# Patient Record
Sex: Female | Born: 1935 | Race: White | Hispanic: No | Smoking: Never smoker
Health system: Southern US, Community
[De-identification: ages and names within clinical notes are randomized; demographics above are authoritative.]

## PROBLEM LIST (undated history)

## (undated) DIAGNOSIS — M1990 Unspecified osteoarthritis, unspecified site: Secondary | ICD-10-CM

## (undated) DIAGNOSIS — C4490 Unspecified malignant neoplasm of skin, unspecified: Secondary | ICD-10-CM

## (undated) DIAGNOSIS — I10 Essential (primary) hypertension: Secondary | ICD-10-CM

## (undated) DIAGNOSIS — L570 Actinic keratosis: Secondary | ICD-10-CM

## (undated) DIAGNOSIS — A419 Sepsis, unspecified organism: Secondary | ICD-10-CM

## (undated) DIAGNOSIS — K5909 Other constipation: Secondary | ICD-10-CM

## (undated) DIAGNOSIS — K863 Pseudocyst of pancreas: Secondary | ICD-10-CM

## (undated) DIAGNOSIS — D649 Anemia, unspecified: Secondary | ICD-10-CM

## (undated) DIAGNOSIS — R001 Bradycardia, unspecified: Secondary | ICD-10-CM

## (undated) DIAGNOSIS — K862 Cyst of pancreas: Secondary | ICD-10-CM

## (undated) DIAGNOSIS — R600 Localized edema: Secondary | ICD-10-CM

## (undated) DIAGNOSIS — I482 Chronic atrial fibrillation, unspecified: Secondary | ICD-10-CM

## (undated) DIAGNOSIS — K219 Gastro-esophageal reflux disease without esophagitis: Secondary | ICD-10-CM

## (undated) DIAGNOSIS — M419 Scoliosis, unspecified: Secondary | ICD-10-CM

## (undated) DIAGNOSIS — Z9181 History of falling: Secondary | ICD-10-CM

## (undated) HISTORY — DX: Scoliosis, unspecified: M41.9

## (undated) HISTORY — DX: Cyst of pancreas: K86.2

## (undated) HISTORY — DX: Localized edema: R60.0

## (undated) HISTORY — DX: Pseudocyst of pancreas: K86.3

## (undated) HISTORY — DX: Other constipation: K59.09

## (undated) HISTORY — PX: HERNIA REPAIR: SHX51

## (undated) HISTORY — DX: Unspecified malignant neoplasm of skin, unspecified: C44.90

## (undated) HISTORY — DX: Gastro-esophageal reflux disease without esophagitis: K21.9

## (undated) HISTORY — DX: Sepsis, unspecified organism: A41.9

## (undated) HISTORY — DX: Essential (primary) hypertension: I10

## (undated) HISTORY — DX: Anemia, unspecified: D64.9

## (undated) HISTORY — DX: Actinic keratosis: L57.0

---

## 1958-06-20 HISTORY — PX: TONSILLECTOMY: SHX5217

## 1976-06-20 HISTORY — PX: TUBAL LIGATION: SHX77

## 2004-06-20 HISTORY — PX: COLONOSCOPY: SHX174

## 2004-12-10 ENCOUNTER — Ambulatory Visit: Payer: Self-pay | Admitting: Unknown Physician Specialty

## 2006-06-19 ENCOUNTER — Ambulatory Visit: Payer: Self-pay | Admitting: Internal Medicine

## 2008-03-20 ENCOUNTER — Encounter: Payer: Self-pay | Admitting: General Practice

## 2008-04-20 ENCOUNTER — Encounter: Payer: Self-pay | Admitting: General Practice

## 2008-05-20 ENCOUNTER — Encounter: Payer: Self-pay | Admitting: General Practice

## 2008-05-27 ENCOUNTER — Ambulatory Visit: Payer: Self-pay

## 2008-11-20 ENCOUNTER — Ambulatory Visit: Payer: Self-pay | Admitting: Unknown Physician Specialty

## 2008-12-11 DIAGNOSIS — C4491 Basal cell carcinoma of skin, unspecified: Secondary | ICD-10-CM

## 2008-12-11 HISTORY — DX: Basal cell carcinoma of skin, unspecified: C44.91

## 2010-04-26 ENCOUNTER — Emergency Department: Payer: Self-pay | Admitting: Emergency Medicine

## 2011-11-17 ENCOUNTER — Ambulatory Visit: Payer: Self-pay | Admitting: Physical Medicine and Rehabilitation

## 2012-01-05 ENCOUNTER — Ambulatory Visit: Payer: Self-pay | Admitting: Unknown Physician Specialty

## 2012-04-26 ENCOUNTER — Ambulatory Visit: Payer: Self-pay | Admitting: Internal Medicine

## 2012-05-02 ENCOUNTER — Ambulatory Visit: Payer: Self-pay | Admitting: Internal Medicine

## 2012-06-14 ENCOUNTER — Encounter: Payer: Self-pay | Admitting: Physical Medicine and Rehabilitation

## 2012-06-20 ENCOUNTER — Encounter: Payer: Self-pay | Admitting: Physical Medicine and Rehabilitation

## 2012-07-21 ENCOUNTER — Encounter: Payer: Self-pay | Admitting: Physical Medicine and Rehabilitation

## 2012-08-18 ENCOUNTER — Encounter: Payer: Self-pay | Admitting: Physical Medicine and Rehabilitation

## 2012-09-03 ENCOUNTER — Ambulatory Visit: Payer: Self-pay | Admitting: Internal Medicine

## 2012-09-18 ENCOUNTER — Encounter: Payer: Self-pay | Admitting: Physical Medicine and Rehabilitation

## 2012-09-21 ENCOUNTER — Ambulatory Visit: Payer: Self-pay | Admitting: Cardiology

## 2012-10-18 ENCOUNTER — Encounter: Payer: Self-pay | Admitting: Physical Medicine and Rehabilitation

## 2012-11-18 ENCOUNTER — Encounter: Payer: Self-pay | Admitting: Physical Medicine and Rehabilitation

## 2012-12-18 ENCOUNTER — Encounter: Payer: Self-pay | Admitting: Physical Medicine and Rehabilitation

## 2013-02-20 ENCOUNTER — Encounter: Payer: Self-pay | Admitting: Physical Medicine and Rehabilitation

## 2013-03-20 ENCOUNTER — Encounter: Payer: Self-pay | Admitting: Physical Medicine and Rehabilitation

## 2013-03-25 DIAGNOSIS — C4492 Squamous cell carcinoma of skin, unspecified: Secondary | ICD-10-CM

## 2013-03-25 HISTORY — DX: Squamous cell carcinoma of skin, unspecified: C44.92

## 2013-04-02 ENCOUNTER — Ambulatory Visit: Payer: Self-pay | Admitting: Internal Medicine

## 2013-06-02 DIAGNOSIS — K862 Cyst of pancreas: Secondary | ICD-10-CM

## 2013-06-02 HISTORY — DX: Cyst of pancreas: K86.2

## 2013-06-10 ENCOUNTER — Encounter: Payer: Self-pay | Admitting: *Deleted

## 2013-07-01 ENCOUNTER — Ambulatory Visit (INDEPENDENT_AMBULATORY_CARE_PROVIDER_SITE_OTHER): Payer: Medicare Other | Admitting: General Surgery

## 2013-07-01 ENCOUNTER — Encounter: Payer: Self-pay | Admitting: General Surgery

## 2013-07-01 VITALS — BP 110/70 | HR 72 | Resp 13 | Ht 63.0 in | Wt 96.0 lb

## 2013-07-01 DIAGNOSIS — K4090 Unilateral inguinal hernia, without obstruction or gangrene, not specified as recurrent: Secondary | ICD-10-CM | POA: Insufficient documentation

## 2013-07-01 NOTE — Progress Notes (Signed)
Patient ID: Shelly Ponce, female   DOB: 12/09/1932, 80 y.o.   MRN: [id number]  Chief Complaint  Patient presents with  . Other    right inguinal hernia    HPI Shelly Ponce is a 80 y.o. female here today for an evaluation of right inguinal hernia. Patient states she had a cat done at [facility] on 06/03/13. Patient states she did not no she had a right inguinal  hernia until she had a cat scan done . HPI  Past Medical History  Diagnosis Date  . Scoliosis     Past Surgical History  Procedure Laterality Date  . Tonsillectomy  1960  . Colonoscopy  2006  . Tubal ligation  1978    History reviewed. No pertinent family history.  Social History History  Substance Use Topics  . Smoking status: Never Smoker   . Smokeless tobacco: Never Used  . Alcohol Use: No    Allergies  Allergen Reactions  . Sulfa Antibiotics Rash    Current Outpatient Prescriptions  Medication Sig Dispense Refill  . aspirin 81 MG tablet Take 81 mg by mouth daily.      . B Complex Vitamins (B-COMPLEX/B-12 PO) Take 1 tablet by mouth daily.      . cholecalciferol (VITAMIN D) 1000 UNITS tablet Take 1,000 Units by mouth daily.      . clorazepate (TRANXENE) 7.5 MG tablet Take 1 tablet by mouth daily.      Marland Kitchen HYDROcodone-acetaminophen (NORCO) 7.5-325 MG per tablet Take 1 tablet by mouth as needed.      . meloxicam (MOBIC) 7.5 MG tablet Take 1 tablet by mouth daily.      Marland Kitchen PREMPRO 0.3-1.5 MG per tablet       . VOLTAREN 1 % GEL        No current facility-administered medications for this visit.    Review of Systems Review of Systems  Constitutional: Negative.   Respiratory: Negative.   Cardiovascular: Negative.     Blood pressure 110/70, pulse 72, resp. rate 13, height 5\' 3"  (1.6 m), weight 96 lb (43.545 kg).  Physical Exam Physical Exam  Constitutional: She is oriented to person, place, and time. She appears well-developed and well-nourished.  Eyes: Conjunctivae are normal. No scleral icterus.   Neck: Neck supple.  Cardiovascular: Normal rate, regular rhythm and normal heart sounds.   Pulmonary/Chest: Effort normal and breath sounds normal.  Abdominal: Soft. Normal appearance and bowel sounds are normal. There is no hepatosplenomegaly. There is no tenderness. A hernia is present. Hernia confirmed positive in the right inguinal area (small  reducible  nontender).  Neurological: She is alert and oriented to person, place, and time.  Skin: Skin is warm and dry.    Data Reviewed Ct report reviewed-right ing hernia with small bowel content, no obstructive finding.  Assessment    Right ing hernia, small an reducible, non tender.    Plan    Hernia precautions explained. Pt is due to have pelvic US for uterine abnormality.  Will await result on this and discuss with Dr. Cristino Martes if pt needs any intervention for that also. If so, hernia repair can be accomplished at same time.  Pt advised fully.       SANKAR,SEEPLAPUTHUR G 07/01/2013, 10:24 AM

## 2013-07-01 NOTE — Patient Instructions (Addendum)
Follow up appointment to be announced.   Inguinal Hernia, Adult Muscles help keep everything in the body in its proper place. But if a weak spot in the muscles develops, something can poke through. That is called a hernia. When this happens in the lower part of the belly (abdomen), it is called an inguinal hernia. (It takes its name from a part of the body in this region called the inguinal canal.) A weak spot in the wall of muscles lets some fat or part of the small intestine bulge through. An inguinal hernia can develop at any age. Men get them more often than women. CAUSES  In adults, an inguinal hernia develops over time.  It can be triggered by:  Suddenly straining the muscles of the lower abdomen.  Lifting heavy objects.  Straining to have a bowel movement. Difficult bowel movements (constipation) can lead to this.  Constant coughing. This may be caused by smoking or lung disease.  Being overweight.  Being pregnant.  Working at a job that requires long periods of standing or heavy lifting.  Having had an inguinal hernia before. One type can be an emergency situation. It is called a strangulated inguinal hernia. It develops if part of the small intestine slips through the weak spot and cannot get back into the abdomen. The blood supply can be cut off. If that happens, part of the intestine may die. This situation requires emergency surgery. SYMPTOMS  Often, a small inguinal hernia has no symptoms. It is found when a healthcare provider does a physical exam. Larger hernias usually have symptoms.   In adults, symptoms may include:  A lump in the groin. This is easier to see when the person is standing. It might disappear when lying down.  In men, a lump in the scrotum.  Pain or burning in the groin. This occurs especially when lifting, straining or coughing.  A dull ache or feeling of pressure in the groin.  Signs of a strangulated hernia can include:  A bulge in the groin  that becomes very painful and tender to the touch.  A bulge that turns red or purple.  Fever, nausea and vomiting.  Inability to have a bowel movement or to pass gas. DIAGNOSIS  To decide if you have an inguinal hernia, a healthcare provider will probably do a physical examination.  This will include asking questions about any symptoms you have noticed.  The healthcare provider might feel the groin area and ask you to cough. If an inguinal hernia is felt, the healthcare provider may try to slide it back into the abdomen.  Usually no other tests are needed. TREATMENT  Treatments can vary. The size of the hernia makes a difference. Options include:  Watchful waiting. This is often suggested if the hernia is small and you have had no symptoms.  No medical procedure will be done unless symptoms develop.  You will need to watch closely for symptoms. If any occur, contact your healthcare provider right away.  Surgery. This is used if the hernia is larger or you have symptoms.  Open surgery. This is usually an outpatient procedure (you will not stay overnight in a hospital). An cut (incision) is made through the skin in the groin. The hernia is put back inside the abdomen. The weak area in the muscles is then repaired by herniorrhaphy or hernioplasty. Herniorrhaphy: in this type of surgery, the weak muscles are sewn back together. Hernioplasty: a patch or mesh is used to close the  weak area in the abdominal wall.  Laparoscopy. In this procedure, a surgeon makes small incisions. A thin tube with a tiny video camera (called a laparoscope) is put into the abdomen. The surgeon repairs the hernia with mesh by looking with the video camera and using two long instruments. HOME CARE INSTRUCTIONS   After surgery to repair an inguinal hernia:  You will need to take pain medicine prescribed by your healthcare provider. Follow all directions carefully.  You will need to take care of the wound from  the incision.  Your activity will be restricted for awhile. This will probably include no heavy lifting for several weeks. You also should not do anything too active for a few weeks. When you can return to work will depend on the type of job that you have.  During "watchful waiting" periods, you should:  Maintain a healthy weight.  Eat a diet high in fiber (fruits, vegetables and whole grains).  Drink plenty of fluids to avoid constipation. This means drinking enough water and other liquids to keep your urine clear or pale yellow.  Do not lift heavy objects.  Do not stand for long periods of time.  Quit smoking. This should keep you from developing a frequent cough. SEEK MEDICAL CARE IF:   A bulge develops in your groin area.  You feel pain, a burning sensation or pressure in the groin. This might be worse if you are lifting or straining.  You develop a fever of more than 100.5 F (38.1 C). SEEK IMMEDIATE MEDICAL CARE IF:   Pain in the groin increases suddenly.  A bulge in the groin gets bigger suddenly and does not go down.  For men, there is sudden pain in the scrotum. Or, the size of the scrotum increases.  A bulge in the groin area becomes red or purple and is painful to touch.  You have nausea or vomiting that does not go away.  You feel your heart beating much faster than normal.  You cannot have a bowel movement or pass gas.  You develop a fever of more than 102.0 F (38.9 C). Document Released: 10/23/2008 Document Revised: 08/29/2011 Document Reviewed: 10/23/2008 [facility] Patient Information 2014 [place], [place].

## 2013-08-05 ENCOUNTER — Encounter: Payer: Self-pay | Admitting: General Surgery

## 2013-08-05 ENCOUNTER — Ambulatory Visit (INDEPENDENT_AMBULATORY_CARE_PROVIDER_SITE_OTHER): Payer: Medicare Other | Admitting: General Surgery

## 2013-08-05 VITALS — BP 100/68 | HR 68 | Resp 12 | Ht 63.0 in | Wt 93.0 lb

## 2013-08-05 DIAGNOSIS — K4090 Unilateral inguinal hernia, without obstruction or gangrene, not specified as recurrent: Secondary | ICD-10-CM

## 2013-08-05 NOTE — Progress Notes (Signed)
Patient ID: Shelly Ponce, female   DOB: 1932/09/19, 80 y.o.   MRN: [id number]  Chief Complaint  Patient presents with  . Pre-op Exam    right inguinal hernia    HPI Shelly Ponce is a 80 y.o. female here today for her pre op right inguinal hernia repair. Her pelvic US was normal. No plan for any intervention for her uterus/ovaries. HPI  Past Medical History  Diagnosis Date  . Scoliosis     Past Surgical History  Procedure Laterality Date  . Tonsillectomy  1960  . Colonoscopy  2006  . Tubal ligation  1978    History reviewed. No pertinent family history.  Social History History  Substance Use Topics  . Smoking status: Never Smoker   . Smokeless tobacco: Never Used  . Alcohol Use: No    Allergies  Allergen Reactions  . Sulfa Antibiotics Rash    Current Outpatient Prescriptions  Medication Sig Dispense Refill  . aspirin 81 MG tablet Take 81 mg by mouth daily.      . B Complex Vitamins (B-COMPLEX/B-12 PO) Take 1 tablet by mouth daily.      . cholecalciferol (VITAMIN D) 1000 UNITS tablet Take 1,000 Units by mouth daily.      . clorazepate (TRANXENE) 7.5 MG tablet Take 1 tablet by mouth daily.      Marland Kitchen HYDROcodone-acetaminophen (NORCO) 7.5-325 MG per tablet Take 1 tablet by mouth as needed.      . meloxicam (MOBIC) 7.5 MG tablet Take 1 tablet by mouth daily.      Marland Kitchen PREMPRO 0.3-1.5 MG per tablet       . VOLTAREN 1 % GEL        No current facility-administered medications for this visit.    Review of Systems Review of Systems  Constitutional: Negative.   Respiratory: Negative.   Cardiovascular: Negative.     Blood pressure 100/68, pulse 68, resp. rate 12, height 5\' 3"  (1.6 m), weight 93 lb (42.185 kg).  Physical Exam Physical Exam  Constitutional: She appears well-developed and well-nourished.  Eyes: Conjunctivae are normal.  Neck: Neck supple. No tracheal deviation present. No thyromegaly present.  Cardiovascular: Normal rate and normal heart sounds.    Pulmonary/Chest: Effort normal and breath sounds normal.  Abdominal: Soft. There is no tenderness. A hernia is present. Hernia confirmed positive in the right inguinal area.    Data Reviewed none  Assessment    Right inguinal hernia     Plan    Discussed right inguinal hernia repair. Pt is agreeable.       Antwyne Pingree G 08/05/2013, 12:54 PM

## 2013-08-05 NOTE — Patient Instructions (Signed)
Hernia Repair with Laparoscope A hernia occurs when an internal organ pushes out through a weak spot in the belly (abdominal) wall muscles. Hernias most commonly occur in the groin and around the navel. Hernias can also occur through a cut by the surgeon (incision) after an abdominal operation. A hernia may be caused by:  Lifting heavy objects.  Prolonged coughing.  Straining to move your bowels. Hernias can often be pushed back into place (reduced). Most hernias tend to get worse over time. Problems occur when abdominal contents get stuck in the opening and the blood supply is blocked or impaired (incarcerated hernia). Because of these risks, you require surgery to repair the hernia. Your hernia will be repaired using a laparoscope. Laparoscopic surgery is a type of minimally invasive surgery. It does not involve making a typical surgical cut (incision) in the skin. A laparoscope is a telescope-like rod and lens system. It is usually connected to a video camera and a light source so your caregiver can clearly see the operative area. The instruments are inserted through  to  inch (5 mm or 10 mm) openings in the skin at specific locations. A working and viewing space is created by blowing a small amount of carbon dioxide gas into the abdominal cavity. The abdomen is essentially blown up like a balloon (insufflated). This elevates the abdominal wall above the internal organs like a dome. The carbon dioxide gas is common to the human body and can be absorbed by tissue and removed by the respiratory system. Once the repair is completed, the small incisions will be closed with either stitches (sutures) or staples (just like a paper stapler only this staple holds the skin together). LET YOUR CAREGIVERS KNOW ABOUT:  Allergies.  Medications taken including herbs, eye drops, over the counter medications, and creams.  Use of steroids (by mouth or creams).  Previous problems with anesthetics or  Novocaine.  Possibility of pregnancy, if this applies.  History of blood clots (thrombophlebitis).  History of bleeding or blood problems.  Previous surgery.  Other health problems. BEFORE THE PROCEDURE  Laparoscopy can be done either in a hospital or out-patient clinic. You may be given a mild sedative to help you relax before the procedure. Once in the operating room, you will be given a general anesthesia to make you sleep (unless you and your caregiver choose a different anesthetic).  AFTER THE PROCEDURE  After the procedure you will be watched in a recovery area. Depending on what type of hernia was repaired, you might be admitted to the hospital or you might go home the same day. With this procedure you may have less pain and scarring. This usually results in a quicker recovery and less risk of infection. HOME CARE INSTRUCTIONS   Bed rest is not required. You may continue your normal activities but avoid heavy lifting (more than 10 pounds) or straining.  Cough gently. If you are a smoker it is best to stop, as even the best hernia repair can break down with the continual strain of coughing.  Avoid driving until given the OK by your surgeon.  There are no dietary restrictions unless given otherwise.  TAKE ALL MEDICATIONS AS DIRECTED.  Only take over-the-counter or prescription medicines for pain, discomfort, or fever as directed by your caregiver. SEEK MEDICAL CARE IF:   There is increasing abdominal pain or pain in your incisions.  There is more bleeding from incisions, other than minimal spotting.  You feel light headed or faint.  You   develop an unexplained fever, chills, and/or an oral temperature above 102 F (38.9 C).  You have redness, swelling, or increasing pain in the wound.  Pus coming from wound.  A foul smell coming from the wound or dressings. SEEK IMMEDIATE MEDICAL CARE IF:   You develop a rash.  You have difficulty breathing.  You have any  allergic problems. MAKE SURE YOU:   Understand these instructions.  Will watch your condition.  Will get help right away if you are not doing well or get worse. Document Released: 06/06/2005 Document Revised: 08/29/2011 Document Reviewed: 05/06/2009 [employer] Patient Information 2014 [employer].  

## 2013-08-05 NOTE — Progress Notes (Signed)
Patient ID: Shelly Ponce, female   DOB: 1932/07/05, 80 y.o.   MRN: [id number] Patient has been scheduled for repair of right inguinal hernia at [facility] for 08/15/13. She is scheduled for preadmit testing at [facility] on 08/15/13 @12 :45 pm. Patient is aware of date and time. She will call with any questions or changes in medication or health status.

## 2013-08-07 ENCOUNTER — Other Ambulatory Visit: Payer: Self-pay | Admitting: General Surgery

## 2013-08-07 DIAGNOSIS — K4090 Unilateral inguinal hernia, without obstruction or gangrene, not specified as recurrent: Secondary | ICD-10-CM

## 2013-08-13 ENCOUNTER — Ambulatory Visit: Payer: Self-pay | Admitting: General Surgery

## 2013-08-13 LAB — BASIC METABOLIC PANEL
Anion Gap: 4 — ABNORMAL LOW (ref 7–16)
BUN: 19 mg/dL — AB (ref 7–18)
CHLORIDE: 108 mmol/L — AB (ref 98–107)
Calcium, Total: 9 mg/dL (ref 8.5–10.1)
Co2: 29 mmol/L (ref 21–32)
Creatinine: 0.75 mg/dL (ref 0.60–1.30)
Glucose: 75 mg/dL (ref 65–99)
Osmolality: 282 (ref 275–301)
POTASSIUM: 4 mmol/L (ref 3.5–5.1)
Sodium: 141 mmol/L (ref 136–145)

## 2013-08-13 LAB — CBC WITH DIFFERENTIAL/PLATELET
Basophil #: 0.1 10*3/uL (ref 0.0–0.1)
Basophil %: 1.2 %
EOS PCT: 1.3 %
Eosinophil #: 0.1 10*3/uL (ref 0.0–0.7)
HCT: 37 % (ref 35.0–47.0)
HGB: 12.1 g/dL (ref 12.0–16.0)
Lymphocyte #: 1.2 10*3/uL (ref 1.0–3.6)
Lymphocyte %: 18.4 %
MCH: 31.9 pg (ref 26.0–34.0)
MCHC: 32.7 g/dL (ref 32.0–36.0)
MCV: 98 fL (ref 80–100)
MONOS PCT: 7 %
Monocyte #: 0.5 x10 3/mm (ref 0.2–0.9)
NEUTROS ABS: 4.8 10*3/uL (ref 1.4–6.5)
Neutrophil %: 72.1 %
PLATELETS: 219 10*3/uL (ref 150–440)
RBC: 3.8 10*6/uL (ref 3.80–5.20)
RDW: 14 % (ref 11.5–14.5)
WBC: 6.7 10*3/uL (ref 3.6–11.0)

## 2013-08-14 ENCOUNTER — Telehealth: Payer: Self-pay | Admitting: *Deleted

## 2013-08-14 ENCOUNTER — Encounter: Payer: Self-pay | Admitting: General Surgery

## 2013-08-14 NOTE — Telephone Encounter (Signed)
Patient was contacted because pre-admit said patient would need cardiac clearance from Dr. Ubaldo Glassing prior to surgery. This patient's surgery that was scheduled for 08-15-13 at [facility] has been cancelled. Leah in the [place]. notified accordingly.  This patient has been scheduled for an appointment for cardiac clearance for 08-19-13 at 9:15 am. She is aware of date, time, and instructions. Records have been forwarded for Dr. Bethanne Ginger review.

## 2013-08-14 NOTE — Telephone Encounter (Signed)
Per Tomi Bamberger at [facility], Dr. Ubaldo Glassing has cleared patient without having to have an office visit. We have received clearance in writing and pre-admission has a copy as well. Cardiac clearance appointment that was originally scheduled for 08-19-13 has been cancelled with Butch Penny at Dr. Bethanne Ginger office.   Patient's surgery has been scheduled for 08-26-13 at [facility]. Leah in the [facility] has been notified accordingly.

## 2013-08-26 ENCOUNTER — Ambulatory Visit: Payer: Self-pay | Admitting: General Surgery

## 2013-08-26 ENCOUNTER — Encounter: Payer: Self-pay | Admitting: General Surgery

## 2013-08-26 DIAGNOSIS — K4090 Unilateral inguinal hernia, without obstruction or gangrene, not specified as recurrent: Secondary | ICD-10-CM

## 2013-09-04 ENCOUNTER — Ambulatory Visit (INDEPENDENT_AMBULATORY_CARE_PROVIDER_SITE_OTHER): Payer: Medicare Other | Admitting: General Surgery

## 2013-09-04 ENCOUNTER — Encounter: Payer: Self-pay | Admitting: General Surgery

## 2013-09-04 VITALS — BP 116/78 | HR 80 | Resp 14 | Ht 63.0 in | Wt 98.0 lb

## 2013-09-04 DIAGNOSIS — K4090 Unilateral inguinal hernia, without obstruction or gangrene, not specified as recurrent: Secondary | ICD-10-CM

## 2013-09-04 NOTE — Patient Instructions (Addendum)
Patient to return in one month. May advance activity as tolerated starting in 1 week

## 2013-09-04 NOTE — Progress Notes (Signed)
This is a 80 year old female here today for her post op right inguinal hernia repair done 08/26/13. Patient  States she is doing well.  Incision looks clean and is healing well.  Patient to return in one month. The patient is aware to use an antiinflammatory of choice (Advil or Aleve) as needed for comfort.

## 2013-10-08 ENCOUNTER — Ambulatory Visit (INDEPENDENT_AMBULATORY_CARE_PROVIDER_SITE_OTHER): Payer: Medicare Other | Admitting: General Surgery

## 2013-10-08 ENCOUNTER — Encounter: Payer: Self-pay | Admitting: General Surgery

## 2013-10-08 VITALS — BP 118/80 | HR 80 | Resp 14 | Ht 63.0 in | Wt 94.0 lb

## 2013-10-08 DIAGNOSIS — K4090 Unilateral inguinal hernia, without obstruction or gangrene, not specified as recurrent: Secondary | ICD-10-CM

## 2013-10-08 NOTE — Progress Notes (Signed)
This is a 80 year old female here today for her post op right inguinal hernia repair done 08/26/13. Patient states she is doing well.  Hernia repair is intact. Incision well healed. Abdomen is soft, non tender.

## 2013-10-08 NOTE — Patient Instructions (Signed)
Patient to return as needed. The patient is aware to call back for any questions or concerns. 

## 2013-10-09 ENCOUNTER — Encounter: Payer: Self-pay | Admitting: General Surgery

## 2013-10-29 ENCOUNTER — Encounter: Payer: Self-pay | Admitting: Physical Medicine and Rehabilitation

## 2013-11-18 ENCOUNTER — Emergency Department: Payer: Self-pay | Admitting: Emergency Medicine

## 2013-11-18 ENCOUNTER — Encounter: Payer: Self-pay | Admitting: Physical Medicine and Rehabilitation

## 2013-12-18 ENCOUNTER — Encounter: Payer: Self-pay | Admitting: Physical Medicine and Rehabilitation

## 2014-01-18 ENCOUNTER — Encounter: Payer: Self-pay | Admitting: Physical Medicine and Rehabilitation

## 2014-01-22 DIAGNOSIS — M5116 Intervertebral disc disorders with radiculopathy, lumbar region: Secondary | ICD-10-CM | POA: Insufficient documentation

## 2014-01-23 DIAGNOSIS — M5136 Other intervertebral disc degeneration, lumbar region: Secondary | ICD-10-CM | POA: Insufficient documentation

## 2014-02-18 ENCOUNTER — Encounter: Payer: Self-pay | Admitting: Physical Medicine and Rehabilitation

## 2014-10-11 NOTE — Op Note (Signed)
PATIENT NAME:  Shelly Ponce, Shelly Ponce MR#:  [id number] DATE OF BIRTH:  July 08, 1932  DATE OF PROCEDURE:  08/26/2013  PREOPERATIVE DIAGNOSIS: Right inguinal hernia.   POSTOPERATIVE DIAGNOSIS: Right inguinal hernia, indirect type.   PROCEDURE: Repair of right inguinal hernia with ProGrip mesh.   SURGEON: Mckinley Jewel, M.D.   ANESTHESIA: Monitored care and local anesthetic of 0.5% Marcaine mixed with 1% Xylocaine, total 16 mL.   COMPLICATIONS: None.   ESTIMATED BLOOD LOSS: Minimal.   DRAINS: None.   DESCRIPTION OF PROCEDURE: The patient was placed in the supine position on the operating table. With adequate sedation and monitoring, the right groin was prepped and draped out as a sterile field. Timeout procedure performed. The local anesthetic was instilled along the inguinal canal on the right and incision was made along the medial portion of this. Incision was deepened through the layers down to the external oblique and bleeding controlled with cautery and ligatures of 3-0 Vicryl. The deeper tissue was instilled with local anesthetic. The external oblique was then opened along the line of its fibers and dissection revealed the presence of an indirect sac that was about 2 to 3 inches in length containing no contents at this time. This was freed all the way down to the internal ring area and opened to reveal that there were no contents, except for a small amount of peritoneal fluid. It was suture ligated high with a 2-0 Vicryl and amputated. The suture was used to tack it to the undersurface of the arching fibers of the internal oblique. The posterior wall was satisfactorily exposed and a Pro grip mesh was brought up, cut to approximate configuration and size and placed on the posterior wall. It was then tacked to the pubic tubercle with a single 2-0 PDS stitch. The external oblique was then closed with 2-0 PDS, subcutaneous tissue closed with 3-0 Vicryl, the wound was irrigated, and the skin closed with  subcuticular 4-0 Vicryl covered with Dermabond. The procedure was well tolerated. She was then returned to the recovery room in stable condition   ____________________________ S.Robinette Haines, MD sgs:sb D: 08/26/2013 08:42:25 ET T: 08/26/2013 11:02:54 ET JOB#: [id number]  cc: S.G. Jamal Collin, MD, <Dictator> [facility] Robinette Haines MD ELECTRONICALLY SIGNED 08/29/2013 15:10

## 2015-01-22 DIAGNOSIS — M7060 Trochanteric bursitis, unspecified hip: Secondary | ICD-10-CM | POA: Insufficient documentation

## 2015-02-24 ENCOUNTER — Ambulatory Visit
Admission: RE | Admit: 2015-02-24 | Discharge: 2015-02-24 | Disposition: A | Discharge status: Home / Self Care | Payer: Medicare Other | Source: Ambulatory Visit | Attending: Physician Assistant | Admitting: Physician Assistant

## 2015-02-24 ENCOUNTER — Other Ambulatory Visit: Payer: Self-pay | Admitting: Physician Assistant

## 2015-02-24 DIAGNOSIS — G939 Disorder of brain, unspecified: Secondary | ICD-10-CM | POA: Diagnosis not present

## 2015-02-24 DIAGNOSIS — S0990XA Unspecified injury of head, initial encounter: Secondary | ICD-10-CM

## 2015-02-24 DIAGNOSIS — G3189 Other specified degenerative diseases of nervous system: Secondary | ICD-10-CM | POA: Diagnosis not present

## 2015-02-24 DIAGNOSIS — W19XXXA Unspecified fall, initial encounter: Secondary | ICD-10-CM | POA: Insufficient documentation

## 2015-03-02 ENCOUNTER — Ambulatory Visit: Payer: Self-pay

## 2015-05-15 ENCOUNTER — Inpatient Hospital Stay
Admission: EM | Admit: 2015-05-15 | Discharge: 2015-05-23 | DRG: 853 | Disposition: A | Discharge status: Skilled Nursing Facility | Payer: Medicare Other | Attending: Internal Medicine | Admitting: Internal Medicine

## 2015-05-15 ENCOUNTER — Encounter: Payer: Self-pay | Admitting: Internal Medicine

## 2015-05-15 ENCOUNTER — Emergency Department: Payer: Medicare Other

## 2015-05-15 DIAGNOSIS — E872 Acidosis: Secondary | ICD-10-CM | POA: Diagnosis present

## 2015-05-15 DIAGNOSIS — M419 Scoliosis, unspecified: Secondary | ICD-10-CM | POA: Diagnosis present

## 2015-05-15 DIAGNOSIS — F329 Major depressive disorder, single episode, unspecified: Secondary | ICD-10-CM | POA: Diagnosis present

## 2015-05-15 DIAGNOSIS — M1990 Unspecified osteoarthritis, unspecified site: Secondary | ICD-10-CM | POA: Diagnosis present

## 2015-05-15 DIAGNOSIS — K651 Peritoneal abscess: Secondary | ICD-10-CM | POA: Diagnosis present

## 2015-05-15 DIAGNOSIS — N390 Urinary tract infection, site not specified: Secondary | ICD-10-CM

## 2015-05-15 DIAGNOSIS — K529 Noninfective gastroenteritis and colitis, unspecified: Secondary | ICD-10-CM | POA: Diagnosis present

## 2015-05-15 DIAGNOSIS — K37 Unspecified appendicitis: Secondary | ICD-10-CM | POA: Diagnosis present

## 2015-05-15 DIAGNOSIS — Z882 Allergy status to sulfonamides status: Secondary | ICD-10-CM | POA: Diagnosis not present

## 2015-05-15 DIAGNOSIS — Z806 Family history of leukemia: Secondary | ICD-10-CM | POA: Diagnosis not present

## 2015-05-15 DIAGNOSIS — Z7982 Long term (current) use of aspirin: Secondary | ICD-10-CM | POA: Diagnosis not present

## 2015-05-15 DIAGNOSIS — N738 Other specified female pelvic inflammatory diseases: Secondary | ICD-10-CM | POA: Diagnosis not present

## 2015-05-15 DIAGNOSIS — A419 Sepsis, unspecified organism: Secondary | ICD-10-CM | POA: Diagnosis present

## 2015-05-15 DIAGNOSIS — K5900 Constipation, unspecified: Secondary | ICD-10-CM | POA: Diagnosis present

## 2015-05-15 DIAGNOSIS — Z66 Do not resuscitate: Secondary | ICD-10-CM | POA: Diagnosis present

## 2015-05-15 DIAGNOSIS — R109 Unspecified abdominal pain: Secondary | ICD-10-CM | POA: Insufficient documentation

## 2015-05-15 HISTORY — DX: Sepsis, unspecified organism: A41.9

## 2015-05-15 HISTORY — DX: Unspecified osteoarthritis, unspecified site: M19.90

## 2015-05-15 LAB — COMPREHENSIVE METABOLIC PANEL
ALBUMIN: 2.4 g/dL — AB (ref 3.5–5.0)
ALT: 14 U/L (ref 14–54)
ANION GAP: 8 (ref 5–15)
AST: 30 U/L (ref 15–41)
Alkaline Phosphatase: 88 U/L (ref 38–126)
BILIRUBIN TOTAL: 0.6 mg/dL (ref 0.3–1.2)
BUN: 21 mg/dL — ABNORMAL HIGH (ref 6–20)
CO2: 24 mmol/L (ref 22–32)
Calcium: 7.8 mg/dL — ABNORMAL LOW (ref 8.9–10.3)
Chloride: 109 mmol/L (ref 101–111)
Creatinine, Ser: 0.73 mg/dL (ref 0.44–1.00)
GFR calc non Af Amer: >60 mL/min (ref >60)
GLUCOSE: 109 mg/dL — AB (ref 65–99)
POTASSIUM: 3.3 mmol/L — AB (ref 3.5–5.1)
SODIUM: 141 mmol/L (ref 135–145)
TOTAL PROTEIN: 5.2 g/dL — AB (ref 6.5–8.1)

## 2015-05-15 LAB — CBC WITH DIFFERENTIAL/PLATELET
BLASTS: 0 %
Band Neutrophils: 12 %
Basophils Absolute: 0 10*3/uL (ref 0–0.1)
Basophils Relative: 0 %
EOS PCT: 2 %
Eosinophils Absolute: 0.1 10*3/uL (ref 0–0.7)
HEMATOCRIT: 35.2 % (ref 35.0–47.0)
HEMOGLOBIN: 11.1 g/dL — AB (ref 12.0–16.0)
Lymphocytes Relative: 10 %
Lymphs Abs: 0.3 10*3/uL — ABNORMAL LOW (ref 1.0–3.6)
MCH: 31.7 pg (ref 26.0–34.0)
MCHC: 31.6 g/dL — ABNORMAL LOW (ref 32.0–36.0)
MCV: 100.4 fL — AB (ref 80.0–100.0)
MYELOCYTES: 0 %
Metamyelocytes Relative: 0 %
Monocytes Absolute: 0 10*3/uL — ABNORMAL LOW (ref 0.2–0.9)
Monocytes Relative: 1 %
NEUTROS PCT: 75 %
NRBC: 0 /100{WBCs}
Neutro Abs: 3 10*3/uL (ref 1.4–6.5)
Other: 0 %
PROMYELOCYTES ABS: 0 %
Platelets: 198 10*3/uL (ref 150–440)
RBC: 3.5 MIL/uL — AB (ref 3.80–5.20)
RDW: 16.6 % — ABNORMAL HIGH (ref 11.5–14.5)
WBC: 3.4 10*3/uL — AB (ref 3.6–11.0)

## 2015-05-15 LAB — URINALYSIS COMPLETE WITH MICROSCOPIC (ARMC ONLY)
BACTERIA UA: NONE SEEN
Bilirubin Urine: NEGATIVE
Glucose, UA: NEGATIVE mg/dL
HGB URINE DIPSTICK: NEGATIVE
Ketones, ur: NEGATIVE mg/dL
Nitrite: NEGATIVE
PH: 7 (ref 5.0–8.0)
Protein, ur: 30 mg/dL — AB
SPECIFIC GRAVITY, URINE: 1.024 (ref 1.005–1.030)

## 2015-05-15 LAB — LACTIC ACID, PLASMA
Lactic Acid, Venous: 4.8 mmol/L (ref 0.5–2.0)
Lactic Acid, Venous: 2.1 mmol/L (ref 0.5–2.0)
Lactic Acid, Venous: 1.7 mmol/L (ref 0.5–2.0)

## 2015-05-15 MED ORDER — TRAZODONE HCL 50 MG PO TABS: 50.0000 mg | ORAL_TABLET | Freq: Every evening | ORAL | Status: DC | PRN

## 2015-05-15 MED ORDER — ASPIRIN EC 81 MG PO TBEC
81.0000 mg | DELAYED_RELEASE_TABLET | Freq: Every day | ORAL | Status: DC
  Administered 2015-05-15 – 2015-05-16 (×2): 81 mg via ORAL
  Filled 2015-05-15 (×2): qty 1

## 2015-05-15 MED ORDER — HYDROCODONE-ACETAMINOPHEN 7.5-325 MG PO TABS: 1.0000 | ORAL_TABLET | Freq: Four times a day (QID) | ORAL | Status: DC | PRN

## 2015-05-15 MED ORDER — SODIUM CHLORIDE 0.9 % IV SOLN
INTRAVENOUS | Status: AC
  Administered 2015-05-16: 12:00:00 via INTRAVENOUS

## 2015-05-15 MED ORDER — SODIUM CHLORIDE 0.9 % IV BOLUS (SEPSIS)
1000.0000 mL | Freq: Once | INTRAVENOUS | Status: AC
  Administered 2015-05-15: 1000 mL via INTRAVENOUS

## 2015-05-15 MED ORDER — OCUVITE-LUTEIN PO CAPS
1.0000 | ORAL_CAPSULE | Freq: Two times a day (BID) | ORAL | Status: DC
  Administered 2015-05-15 – 2015-05-16 (×2): 1 via ORAL
  Filled 2015-05-15 (×2): qty 1

## 2015-05-15 MED ORDER — HEPARIN SODIUM (PORCINE) 5000 UNIT/ML IJ SOLN
5000.0000 [IU] | Freq: Three times a day (TID) | INTRAMUSCULAR | Status: DC
  Administered 2015-05-15 – 2015-05-23 (×21): 5000 [IU] via SUBCUTANEOUS
  Filled 2015-05-15 (×22): qty 1

## 2015-05-15 MED ORDER — PIPERACILLIN-TAZOBACTAM 3.375 G IVPB
3.3750 g | Freq: Three times a day (TID) | INTRAVENOUS | Status: DC
  Filled 2015-05-15 (×3): qty 50

## 2015-05-15 MED ORDER — DICLOFENAC SODIUM 1 % TD GEL
2.0000 g | Freq: Four times a day (QID) | TRANSDERMAL | Status: DC | PRN
  Administered 2015-05-22 – 2015-05-23 (×2): 2 g via TOPICAL
  Filled 2015-05-15: qty 100

## 2015-05-15 MED ORDER — VANCOMYCIN HCL IN DEXTROSE 750-5 MG/150ML-% IV SOLN
750.0000 mg | INTRAVENOUS | Status: DC
  Filled 2015-05-15: qty 150

## 2015-05-15 MED ORDER — PIPERACILLIN-TAZOBACTAM 3.375 G IVPB 30 MIN
3.3750 g | Freq: Once | INTRAVENOUS | Status: AC
  Administered 2015-05-15: 3.375 g via INTRAVENOUS
  Filled 2015-05-15: qty 50

## 2015-05-15 MED ORDER — VANCOMYCIN HCL IN DEXTROSE 1-5 GM/200ML-% IV SOLN
1000.0000 mg | Freq: Once | INTRAVENOUS | Status: AC
  Administered 2015-05-15: 1000 mg via INTRAVENOUS
  Filled 2015-05-15: qty 200

## 2015-05-15 MED ORDER — DEXTROSE 5 % IV SOLN
1.0000 g | INTRAVENOUS | Status: DC
  Administered 2015-05-16: 1 g via INTRAVENOUS
  Filled 2015-05-15: qty 10

## 2015-05-15 MED ORDER — SODIUM CHLORIDE 0.9 % IV BOLUS (SEPSIS)
500.0000 mL | INTRAVENOUS | Status: AC
  Administered 2015-05-15: 500 mL via INTRAVENOUS

## 2015-05-15 MED ORDER — CONJ ESTROG-MEDROXYPROGEST ACE 0.3-1.5 MG PO TABS
1.0000 | ORAL_TABLET | Freq: Every day | ORAL | Status: DC
  Filled 2015-05-15 (×4): qty 1

## 2015-05-15 MED ORDER — SENNOSIDES-DOCUSATE SODIUM 8.6-50 MG PO TABS: 2.0000 | ORAL_TABLET | Freq: Every evening | ORAL | Status: DC | PRN

## 2015-05-15 NOTE — ED Provider Notes (Signed)
[facility] Emergency Department Provider Note    ____________________________________________  Time seen: 1305  I have reviewed the triage vital signs and the nursing notes.   HISTORY  Chief Complaint Fever and Diarrhea   History limited by: Not Limited   HPI Shelly Ponce is a 81 y.o. female who presents to the emergency department today with main complaints of fever and diarrhea. The patient states that she has had a fever for the past 12 days. She states for the past for 5 days she has had diarrhea. She denies any blood or black diarrhea. She denies any associated nausea or vomiting. She has had some lower abdominal pain. She denies any dysuria.   Past Medical History  Diagnosis Date  . Scoliosis     There are no active problems to display for this patient.   Past Surgical History  Procedure Laterality Date  . Tonsillectomy  1960  . Colonoscopy  2006  . Tubal ligation  1978  . Hernia repair Right 3/9/    inguinal hernia    Current Outpatient Rx  Name  Route  Sig  Dispense  Refill  . aspirin 81 MG tablet   Oral   Take 81 mg by mouth daily.         . B Complex Vitamins (B-COMPLEX/B-12 PO)   Oral   Take 1 tablet by mouth daily.         . cholecalciferol (VITAMIN D) 1000 UNITS tablet   Oral   Take 1,000 Units by mouth daily.         . clorazepate (TRANXENE) 7.5 MG tablet   Oral   Take 1 tablet by mouth daily.         Marland Kitchen HYDROcodone-acetaminophen (NORCO) 7.5-325 MG per tablet   Oral   Take 1 tablet by mouth as needed.         . meloxicam (MOBIC) 7.5 MG tablet   Oral   Take 1 tablet by mouth daily.         Marland Kitchen PREMPRO 0.3-1.5 MG per tablet               . VOLTAREN 1 % GEL                 Allergies Sulfa antibiotics  No family history on file.  Social History Social History  Substance Use Topics  . Smoking status: Never Smoker   . Smokeless tobacco: Never Used  . Alcohol Use: No    Review of  Systems  Constitutional: Positive for fever Cardiovascular: Negative for chest pain. Respiratory: Negative for shortness of breath. Gastrointestinal: Positive for lower abdominal pain Genitourinary: Negative for dysuria. Musculoskeletal: Negative for back pain. Skin: Negative for rash. Neurological: Negative for headaches, focal weakness or numbness.  10-point ROS otherwise negative.  ____________________________________________   PHYSICAL EXAM:  VITAL SIGNS: ED Triage Vitals  Enc Vitals Group     BP 05/15/15 1300 105/66 mmHg     Pulse Rate 05/15/15 1300 105     Resp 05/15/15 1300 18     Temp 05/15/15 1300 102.8 F (39.3 C)     Temp Source 05/15/15 1300 Oral     SpO2 05/15/15 1300 98 %   Constitutional: Awake and alert, does not appear to be in any distress. Eyes: Conjunctivae are normal. PERRL. Normal extraocular movements. ENT   Head: Normocephalic and atraumatic.   Nose: No congestion/rhinnorhea.   Mouth/Throat: Mucous membranes are moist.   Neck: No stridor. Hematological/Lymphatic/Immunilogical:  No cervical lymphadenopathy. Cardiovascular: Tachycardic, regular rhythm.  No murmurs, rubs, or gallops. Respiratory: Normal respiratory effort without tachypnea nor retractions. Breath sounds are clear and equal bilaterally. No wheezes/rales/rhonchi. Gastrointestinal: Soft and minimally tender in the lower abdomen. No rebound or guarding. Genitourinary: Deferred Musculoskeletal: Normal range of motion in all extremities. No joint effusions.  No lower extremity tenderness nor edema. Neurologic:  Normal speech and language. No gross focal neurologic deficits are appreciated.  Skin:  Skin is warm, dry and intact. No rash noted. Psychiatric: Mood and affect are normal. Speech and behavior are normal. Patient exhibits appropriate insight and judgment.  ____________________________________________    LABS (pertinent positives/negatives)  Labs Reviewed  LACTIC  ACID, PLASMA - Abnormal; Notable for the following:    Lactic Acid, Venous 4.8 (*)    All other components within normal limits  CBC WITH DIFFERENTIAL/PLATELET - Abnormal; Notable for the following:    WBC 3.4 (*)    RBC 3.50 (*)    Hemoglobin 11.1 (*)    MCV 100.4 (*)    MCHC 31.6 (*)    RDW 16.6 (*)    Lymphs Abs 0.3 (*)    Monocytes Absolute 0.0 (*)    All other components within normal limits  URINALYSIS COMPLETEWITH MICROSCOPIC ([facility] ONLY) - Abnormal; Notable for the following:    Color, Urine YELLOW (*)    APPearance HAZY (*)    Protein, ur 30 (*)    Leukocytes, UA TRACE (*)    Squamous Epithelial / LPF 6-30 (*)    All other components within normal limits  COMPREHENSIVE METABOLIC PANEL - Abnormal; Notable for the following:    Potassium 3.3 (*)    Glucose, Bld 109 (*)    BUN 21 (*)    Calcium 7.8 (*)    Total Protein 5.2 (*)    Albumin 2.4 (*)    All other components within normal limits  LACTIC ACID, PLASMA - Abnormal; Notable for the following:    Lactic Acid, Venous 2.1 (*)    All other components within normal limits  BASIC METABOLIC PANEL - Abnormal; Notable for the following:    Calcium 7.9 (*)    All other components within normal limits  CBC - Abnormal; Notable for the following:    RBC 3.53 (*)    Hemoglobin 10.9 (*)    HCT 33.5 (*)    All other components within normal limits  CULTURE, BLOOD (ROUTINE X 2)  CULTURE, BLOOD (ROUTINE X 2)  URINE CULTURE  LACTIC ACID, PLASMA    ____________________________________________   EKG  None  ____________________________________________    RADIOLOGY  CXR IMPRESSION: No acute cardiopulmonary abnormality seen.   ____________________________________________   PROCEDURES  Procedure(s) performed: None  Critical Care performed: Yes  CRITICAL CARE Performed by: Nance Pear   Total critical care time: 35 minutes  Critical care time was exclusive of separately billable procedures and  treating other patients.  Critical care was necessary to treat or prevent imminent or life-threatening deterioration.  Critical care was time spent personally by me on the following activities: development of treatment plan with patient and/or surrogate as well as nursing, discussions with consultants, evaluation of patient's response to treatment, examination of patient, obtaining history from patient or surrogate, ordering and performing treatments and interventions, ordering and review of laboratory studies, ordering and review of radiographic studies, pulse oximetry and re-evaluation of patient's condition.   ____________________________________________   INITIAL IMPRESSION / ASSESSMENT AND PLAN / ED COURSE  Pertinent labs &  imaging results that were available during my care of the patient were reviewed by me and considered in my medical decision making (see chart for details).  Patient presented to the emergency department today for fever and diarrhea. Patient was tachycardic and febrile when she first presented to the emergency department thus there was concern for sepsis. Patient was written for IV fluid resuscitation as well as broad-spectrum antibiotics. Patient's lactic acid was elevated to 4.8. Given the concerns for sepsis patient will be admitted to the hospital for further workup and evaluation.  ____________________________________________   FINAL CLINICAL IMPRESSION(S) / ED DIAGNOSES  Final diagnoses:  Sepsis, due to unspecified organism [facility])     Nance Pear, MD 05/16/15 [phone]

## 2015-05-15 NOTE — Progress Notes (Signed)
ANTIBIOTIC CONSULT NOTE - INITIAL  Pharmacy Consult for Vancomycin and Zosyn Indication: sepsis  Allergies  Allergen Reactions  . Macrobid [Nitrofurantoin] Rash  . Sulfa Antibiotics Rash    Patient Measurements: Weight: 94 lb (42.638 kg) Adjusted Body Weight: 48.48kg  Vital Signs: Temp: 98.8 F (37.1 C) (11/25 1558) Temp Source: Oral (11/25 1558) BP: 105/53 mmHg (11/25 1630) Pulse Rate: 96 (11/25 1630) Intake/Output from previous day:   Intake/Output from this shift:    Labs:  Recent Labs  05/15/15 1317 05/15/15 1500  WBC 3.4*  --   HGB 11.1*  --   PLT 198  --   CREATININE  --  0.73   CrCl cannot be calculated (Unknown ideal weight.). No results for input(s): VANCOTROUGH, VANCOPEAK, VANCORANDOM, GENTTROUGH, GENTPEAK, GENTRANDOM, TOBRATROUGH, TOBRAPEAK, TOBRARND, AMIKACINPEAK, AMIKACINTROU, AMIKACIN in the last 72 hours.   Microbiology: No results found for this or any previous visit (from the past 720 hour(s)).  Medical History: Past Medical History  Diagnosis Date  . Scoliosis     Medications:  Scheduled:   Assessment: BW is an 81yo female presenting to the ED for fever, diarrhea. Pharmacy consulted to dose vancomycin and zosyn in this patient to rule out sepsis.  Goal of Therapy:  Vancomycin trough level 15-20 mcg/ml  Plan:  Measure antibiotic drug levels at steady state Follow up culture results   Initiate Zosyn 3.375gm EIV q8hr.   Initiate Vancomycin 750mg  IV q24hr, with first dose occuring 18 hours after first dose for stacked dosing.  Ke= 0.039 hr-1 T1/2= 17.77 hr Vd= 29.82 L  Expected Cmax= 47 Expected Cmin= 19.28  Measure trough prior to 4th dose on 11/28 @ 0700.  Pharmacy will continue to monitor for changes in renal function.   Roe Coombs, PharmD Pharmacy Resident 05/15/2015 5:18 PM

## 2015-05-15 NOTE — H&P (Signed)
[facility] [place] NAME: Shelly Ponce    MR#:  [id number]  DATE OF BIRTH:  03-Mar-1933  DATE OF ADMISSION:  05/15/2015  PRIMARY CARE PHYSICIAN: Adin Hector, MD   REQUESTING/REFERRING PHYSICIAN: Archie Balboa  CHIEF COMPLAINT:   Chief Complaint  Patient presents with  . Fever  . Diarrhea    HISTORY OF PRESENT ILLNESS: Shelly Ponce  is a 81 y.o. female with a known history of scoliosis and arthritis on pain medication chronically, lives at home independent life and very sharp mentally. For last few days she had complain of constipation so she went to urgent care Center and they gave her some Fleet edema she tried that twice in last 2 days at home and some stool softeners which helped her to have bowel movement yesterday. Overall for last 3-4 days she is having lower abdominal pain and that's what prompted her to think about constipation. After having bowel movement she feels little bit better but pain is still there and it is not gone. She denies any urinary symptoms.  She was feeling warm and had chills this morning so finally came to emergency room with that.  She was noted to have tachycardia, fever, high lactic acid- so given his admission to hospitalist team.  Other workup will urinalysis was found to be positive.  PAST MEDICAL HISTORY:   Past Medical History  Diagnosis Date  . Scoliosis   . Arthritis     PAST SURGICAL HISTORY:  Past Surgical History  Procedure Laterality Date  . Tonsillectomy  1960  . Colonoscopy  2006  . Tubal ligation  1978  . Hernia repair Right 3/9/    inguinal hernia    SOCIAL HISTORY:  Social History  Substance Use Topics  . Smoking status: Never Smoker   . Smokeless tobacco: Never Used  . Alcohol Use: No    FAMILY HISTORY:  Family History  Problem Relation Age of Onset  . Dementia Mother   . Leukemia Father     DRUG ALLERGIES:  Allergies  Allergen Reactions  . Macrobid  [Nitrofurantoin] Rash  . Sulfa Antibiotics Rash    REVIEW OF SYSTEMS:   CONSTITUTIONAL: Positive fever, positive for fatigue or weakness.  EYES: No blurred or double vision.  EARS, NOSE, AND THROAT: No tinnitus or ear pain.  RESPIRATORY: No cough, shortness of breath, wheezing or hemoptysis.  CARDIOVASCULAR: No chest pain, orthopnea, edema.  GASTROINTESTINAL: No nausea, vomiting, diarrhea, have a mild lower abdominal pain.  GENITOURINARY: No dysuria, hematuria.  ENDOCRINE: No polyuria, nocturia, or dysuria HEMATOLOGY: No anemia, easy bruising or bleeding SKIN: No rash or lesion. MUSCULOSKELETAL: No joint pain or arthritis.   NEUROLOGIC: No tingling, numbness, weakness.  PSYCHIATRY: No anxiety or depression.   MEDICATIONS AT HOME:  Prior to Admission medications   Medication Sig Start Date End Date Taking? Authorizing Provider  aspirin EC 81 MG tablet Take 81 mg by mouth daily.   Yes Historical Provider, MD  diclofenac sodium (VOLTAREN) 1 % GEL Apply 2 g topically 4 (four) times daily as needed (for pain).   Yes Historical Provider, MD  estrogen, conjugated,-medroxyprogesterone (PREMPRO) 0.3-1.5 MG tablet Take 1 tablet by mouth daily.   Yes Historical Provider, MD  HYDROcodone-acetaminophen (NORCO) 7.5-325 MG tablet Take 1 tablet by mouth every 6 (six) hours as needed for moderate pain.   Yes Historical Provider, MD  meloxicam (MOBIC) 7.5 MG tablet Take 7.5 mg by mouth 2 (two) times daily.  Yes Historical Provider, MD  Multiple Vitamins-Minerals (PRESERVISION AREDS 2) CAPS Take 1 capsule by mouth 2 (two) times daily.   Yes Historical Provider, MD  senna-docusate (SENOKOT-S) 8.6-50 MG tablet Take 2 tablets by mouth at bedtime as needed for mild constipation.   Yes Historical Provider, MD  traZODone (DESYREL) 50 MG tablet Take 50-100 mg by mouth at bedtime as needed for sleep.   Yes Historical Provider, MD      PHYSICAL EXAMINATION:   VITAL SIGNS: Blood pressure 105/53, pulse 96,  temperature 98.8 F (37.1 C), temperature source Oral, resp. rate 17, weight 42.638 kg (94 lb), SpO2 98 %.  GENERAL:  81 y.o.-year-old thin patient lying in the bed with no acute distress.  EYES: Pupils equal, round, reactive to light and accommodation. No scleral icterus. Extraocular muscles intact.  HEENT: Head atraumatic, normocephalic. Oropharynx and nasopharynx clear.  NECK:  Supple, no jugular venous distention. No thyroid enlargement, no tenderness.  LUNGS: Normal breath sounds bilaterally, no wheezing, rales,rhonchi or crepitation. No use of accessory muscles of respiration.  CARDIOVASCULAR: S1, S2 normal. No murmurs, rubs, or gallops.  ABDOMEN: Soft, mild tender, nondistended. Bowel sounds present. No organomegaly or mass.  EXTREMITIES: No pedal edema, cyanosis, or clubbing.  NEUROLOGIC: Cranial nerves II through XII are intact. Muscle strength 5/5 in all extremities. Sensation intact. Gait not checked.  PSYCHIATRIC: The patient is alert and oriented x 3.  SKIN: No obvious rash, lesion, or ulcer.   LABORATORY PANEL:   CBC  Recent Labs Lab 05/15/15 1317  WBC 3.4*  HGB 11.1*  HCT 35.2  PLT 198  MCV 100.4*  MCH 31.7  MCHC 31.6*  RDW 16.6*  LYMPHSABS 0.3*  MONOABS 0.0*  EOSABS 0.1  BASOSABS 0.0   ------------------------------------------------------------------------------------------------------------------  Chemistries   Recent Labs Lab 05/15/15 1500  NA 141  K 3.3*  CL 109  CO2 24  GLUCOSE 109*  BUN 21*  CREATININE 0.73  CALCIUM 7.8*  AST 30  ALT 14  ALKPHOS 88  BILITOT 0.6   ------------------------------------------------------------------------------------------------------------------ CrCl cannot be calculated (Unknown ideal weight.). ------------------------------------------------------------------------------------------------------------------ No results for input(s): TSH, T4TOTAL, T3FREE, THYROIDAB in the last 72 hours.  Invalid input(s):  FREET3   Coagulation profile No results for input(s): INR, PROTIME in the last 168 hours. ------------------------------------------------------------------------------------------------------------------- No results for input(s): DDIMER in the last 72 hours. -------------------------------------------------------------------------------------------------------------------  Cardiac Enzymes No results for input(s): CKMB, TROPONINI, MYOGLOBIN in the last 168 hours.  Invalid input(s): CK ------------------------------------------------------------------------------------------------------------------ Invalid input(s): POCBNP  ---------------------------------------------------------------------------------------------------------------  Urinalysis    Component Value Date/Time   COLORURINE YELLOW* 05/15/2015 1641   APPEARANCEUR HAZY* 05/15/2015 1641   LABSPEC 1.024 05/15/2015 1641   PHURINE 7.0 05/15/2015 1641   GLUCOSEU NEGATIVE 05/15/2015 1641   HGBUR NEGATIVE 05/15/2015 1641   BILIRUBINUR NEGATIVE 05/15/2015 [place] 05/15/2015 1641   PROTEINUR 30* 05/15/2015 1641   NITRITE NEGATIVE 05/15/2015 1641   LEUKOCYTESUR TRACE* 05/15/2015 1641     RADIOLOGY: Dg Abd 1 View  05/15/2015  CLINICAL DATA:  Constipation for 1 week EXAM: ABDOMEN - 1 VIEW COMPARISON:  11/18/2013 FINDINGS: There is normal small bowel gas pattern. Degenerative changes and significant dextroscoliosis upper lumbar spine. No significant colonic stool. IMPRESSION: Normal small bowel gas pattern. No significant colonic stool. Dextroscoliosis and degenerative changes lumbar spine. Electronically Signed   By: Lahoma Crocker M.D.   On: 05/15/2015 17:10   Dg Chest Port 1 View  05/15/2015  CLINICAL DATA:  Fever. EXAM: PORTABLE CHEST 1 VIEW COMPARISON:  None.  FINDINGS: The heart size and mediastinal contours are within normal limits. Both lungs are clear. No pneumothorax or pleural effusion is noted. The  visualized skeletal structures are unremarkable. IMPRESSION: No acute cardiopulmonary abnormality seen. Electronically Signed   By: Marijo Conception, M.D.   On: 05/15/2015 13:29    IMPRESSION AND PLAN:  * Sepsis  Evident by tachycardia, fever, lactic acidosis  Secondary to UTI  Continue IV fluid and Rocephin IV.  Recheck lactic acid in few hours to confirm it coming down.  * UTI  Follow urine culture  Rocephin IV for now.  * Lower abdominal pain  Most likely could be because of UTI  We'll also constipated 2 days ago but it resolved now.  If her lactic acid for her pain does not improve enough we need to do further investigation on this.  * Lactic acidosis  Due to sepsis.  Follow her lactic acid again after IV fluids.  All the records are reviewed and case discussed with ED provider. Management plans discussed with the patient, family and they are in agreement.  CODE STATUS: DO NOT RESUSCITATE  She has a living will done and her power of attorney is Butch Penny- her cousin. Advance Directive Documentation        Most Recent Value   Type of Advance Directive  Healthcare Power of Attorney, Living will   Pre-existing out of facility DNR order (yellow form or pink MOST form)     "MOST" Form in Place?         TOTAL TIME TAKING CARE OF THIS PATIENT: 50 minutes.    [name] M.D on 05/15/2015   Between 7am to 6pm - Pager - [phone]  After 6pm go to [email or web address] - password [email or web address] [facility]  Office  [phone]  CC: Primary care physician; Adin Hector, MD   Note: This dictation was prepared with Dragon dictation along with smaller phrase technology. Any transcriptional errors that result from this process are unintentional.

## 2015-05-15 NOTE — ED Notes (Signed)
Pt arrived via EMS with complaints of diarrhea, fever, chills and shaking. EMS reports pt had used a rectal suppository.

## 2015-05-16 ENCOUNTER — Inpatient Hospital Stay: Payer: Medicare Other

## 2015-05-16 ENCOUNTER — Inpatient Hospital Stay: Payer: Medicare Other | Admitting: Anesthesiology

## 2015-05-16 ENCOUNTER — Encounter: Payer: Self-pay | Admitting: Radiology

## 2015-05-16 ENCOUNTER — Encounter
Admission: EM | Disposition: A | Discharge status: Skilled Nursing Facility | Payer: Self-pay | Source: Home / Self Care | Attending: Internal Medicine

## 2015-05-16 DIAGNOSIS — A419 Sepsis, unspecified organism: Principal | ICD-10-CM

## 2015-05-16 DIAGNOSIS — N390 Urinary tract infection, site not specified: Secondary | ICD-10-CM

## 2015-05-16 DIAGNOSIS — N738 Other specified female pelvic inflammatory diseases: Secondary | ICD-10-CM

## 2015-05-16 DIAGNOSIS — R109 Unspecified abdominal pain: Secondary | ICD-10-CM | POA: Insufficient documentation

## 2015-05-16 DIAGNOSIS — K651 Peritoneal abscess: Secondary | ICD-10-CM

## 2015-05-16 HISTORY — PX: LAPAROTOMY: SHX154

## 2015-05-16 HISTORY — PX: APPENDECTOMY: SHX54

## 2015-05-16 LAB — CBC
HCT: 33.5 % — ABNORMAL LOW (ref 35.0–47.0)
Hemoglobin: 10.9 g/dL — ABNORMAL LOW (ref 12.0–16.0)
MCH: 31.1 pg (ref 26.0–34.0)
MCHC: 32.7 g/dL (ref 32.0–36.0)
MCV: 94.9 fL (ref 80.0–100.0)
PLATELETS: 213 10*3/uL (ref 150–440)
RBC: 3.53 MIL/uL — AB (ref 3.80–5.20)
RDW: 14.3 % (ref 11.5–14.5)
WBC: 10.3 10*3/uL (ref 3.6–11.0)

## 2015-05-16 LAB — BASIC METABOLIC PANEL
Anion gap: 7 (ref 5–15)
BUN: 15 mg/dL (ref 6–20)
CO2: 23 mmol/L (ref 22–32)
CREATININE: 0.64 mg/dL (ref 0.44–1.00)
Calcium: 7.9 mg/dL — ABNORMAL LOW (ref 8.9–10.3)
Chloride: 111 mmol/L (ref 101–111)
Glucose, Bld: 97 mg/dL (ref 65–99)
POTASSIUM: 3.9 mmol/L (ref 3.5–5.1)
SODIUM: 141 mmol/L (ref 135–145)

## 2015-05-16 SURGERY — LAPAROTOMY, EXPLORATORY
Anesthesia: General | Wound class: Clean Contaminated

## 2015-05-16 MED ORDER — 0.9 % SODIUM CHLORIDE (POUR BTL) OPTIME
TOPICAL | Status: DC | PRN
  Administered 2015-05-16: 4000 mL

## 2015-05-16 MED ORDER — FENTANYL CITRATE (PF) 100 MCG/2ML IJ SOLN
25.0000 ug | INTRAMUSCULAR | Status: DC | PRN
Start: 2015-05-16 — End: 2015-05-17
  Administered 2015-05-16 (×4): 25 ug via INTRAVENOUS

## 2015-05-16 MED ORDER — ONDANSETRON HCL 4 MG/2ML IJ SOLN
INTRAMUSCULAR | Status: DC | PRN
Start: 2015-05-16 — End: 2015-05-16
  Administered 2015-05-16: 4 mg via INTRAVENOUS

## 2015-05-16 MED ORDER — PIPERACILLIN-TAZOBACTAM 3.375 G IVPB 30 MIN
3.3750 g | Freq: Once | INTRAVENOUS | Status: AC
  Administered 2015-05-16: 3.375 g via INTRAVENOUS
  Filled 2015-05-16: qty 50

## 2015-05-16 MED ORDER — LIDOCAINE HCL (CARDIAC) 20 MG/ML IV SOLN
INTRAVENOUS | Status: DC | PRN
  Administered 2015-05-16: 50 mg via INTRAVENOUS

## 2015-05-16 MED ORDER — ONDANSETRON HCL 4 MG/2ML IJ SOLN
4.0000 mg | Freq: Once | INTRAMUSCULAR | Status: DC | PRN
  Filled 2015-05-16 (×5): qty 2

## 2015-05-16 MED ORDER — FENTANYL CITRATE (PF) 100 MCG/2ML IJ SOLN
INTRAMUSCULAR | Status: DC | PRN
  Administered 2015-05-16 (×2): 50 ug via INTRAVENOUS
  Administered 2015-05-16: 100 ug via INTRAVENOUS

## 2015-05-16 MED ORDER — NEOSTIGMINE METHYLSULFATE 10 MG/10ML IV SOLN
INTRAVENOUS | Status: DC | PRN
  Administered 2015-05-16: 2 mg via INTRAVENOUS

## 2015-05-16 MED ORDER — EPHEDRINE SULFATE 50 MG/ML IJ SOLN
INTRAMUSCULAR | Status: DC | PRN
  Administered 2015-05-16: 10 mg via INTRAVENOUS

## 2015-05-16 MED ORDER — METOPROLOL TARTRATE 1 MG/ML IV SOLN
5.0000 mg | Freq: Once | INTRAVENOUS | Status: AC
  Administered 2015-05-16: 5 mg via INTRAVENOUS

## 2015-05-16 MED ORDER — DEXAMETHASONE SODIUM PHOSPHATE 4 MG/ML IJ SOLN
INTRAMUSCULAR | Status: DC | PRN
  Administered 2015-05-16: 4 mg via INTRAVENOUS

## 2015-05-16 MED ORDER — ROCURONIUM BROMIDE 100 MG/10ML IV SOLN
INTRAVENOUS | Status: DC | PRN
  Administered 2015-05-16: 20 mg via INTRAVENOUS

## 2015-05-16 MED ORDER — SUCCINYLCHOLINE CHLORIDE 20 MG/ML IJ SOLN
INTRAMUSCULAR | Status: DC | PRN
  Administered 2015-05-16: 80 mg via INTRAVENOUS

## 2015-05-16 MED ORDER — LACTATED RINGERS IV SOLN: INTRAVENOUS | Status: DC

## 2015-05-16 MED ORDER — MORPHINE SULFATE (PF) 2 MG/ML IV SOLN
2.0000 mg | INTRAVENOUS | Status: DC | PRN
  Administered 2015-05-17: 2 mg via INTRAVENOUS
  Filled 2015-05-16: qty 1

## 2015-05-16 MED ORDER — PIPERACILLIN-TAZOBACTAM 3.375 G IVPB
3.3750 g | Freq: Three times a day (TID) | INTRAVENOUS | Status: DC
  Administered 2015-05-17 – 2015-05-19 (×7): 3.375 g via INTRAVENOUS
  Filled 2015-05-16 (×10): qty 50

## 2015-05-16 MED ORDER — LACTATED RINGERS IV SOLN
INTRAVENOUS | Status: DC | PRN
  Administered 2015-05-16: 21:00:00 via INTRAVENOUS

## 2015-05-16 MED ORDER — IOHEXOL 300 MG/ML  SOLN
50.0000 mL | Freq: Once | INTRAMUSCULAR | Status: AC | PRN
  Administered 2015-05-16: 50 mL via INTRAVENOUS

## 2015-05-16 MED ORDER — PHENYLEPHRINE HCL 10 MG/ML IJ SOLN
INTRAMUSCULAR | Status: DC | PRN
  Administered 2015-05-16: 100 ug via INTRAVENOUS

## 2015-05-16 MED ORDER — PROPOFOL 10 MG/ML IV BOLUS
INTRAVENOUS | Status: DC | PRN
  Administered 2015-05-16: 150 mg via INTRAVENOUS

## 2015-05-16 MED ORDER — BUPIVACAINE-EPINEPHRINE (PF) 0.25% -1:200000 IJ SOLN
INTRAMUSCULAR | Status: DC | PRN
  Administered 2015-05-16: 20 mL

## 2015-05-16 MED ORDER — HYDROMORPHONE HCL 1 MG/ML IJ SOLN
0.2500 mg | INTRAMUSCULAR | Status: DC | PRN
  Administered 2015-05-16: 0.5 mg via INTRAVENOUS
  Administered 2015-05-16 (×2): 0.25 mg via INTRAVENOUS

## 2015-05-16 MED ORDER — KCL IN DEXTROSE-NACL 20-5-0.45 MEQ/L-%-% IV SOLN
INTRAVENOUS | Status: DC
  Administered 2015-05-17: 16:00:00 via INTRAVENOUS
  Administered 2015-05-17: 1 mL via INTRAVENOUS
  Administered 2015-05-17 – 2015-05-18 (×3): via INTRAVENOUS
  Filled 2015-05-16 (×9): qty 1000

## 2015-05-16 SURGICAL SUPPLY — 33 items
BRR ADH 6X5 SEPRAFILM 1 SHT (MISCELLANEOUS) ×2
CANISTER SUCT 1200ML W/VALVE (MISCELLANEOUS) ×4 IMPLANT
CATH TRAY 16F METER LATEX (MISCELLANEOUS) ×4 IMPLANT
CHLORAPREP W/TINT 26ML (MISCELLANEOUS) ×4 IMPLANT
DRAIN PENROSE 1/4X12 LTX (DRAIN) ×2 IMPLANT
DRAPE LAPAROTOMY 100X77 ABD (DRAPES) ×4 IMPLANT
DRSG OPSITE POSTOP 4X10 (GAUZE/BANDAGES/DRESSINGS) ×2 IMPLANT
DRSG OPSITE POSTOP 4X8 (GAUZE/BANDAGES/DRESSINGS) ×2 IMPLANT
DRSG TELFA 3X8 NADH (GAUZE/BANDAGES/DRESSINGS) ×4 IMPLANT
GAUZE SPONGE 4X4 12PLY STRL (GAUZE/BANDAGES/DRESSINGS) ×2 IMPLANT
GLOVE BIO SURGEON STRL SZ8 (GLOVE) ×12 IMPLANT
GOWN STRL REUS W/ TWL LRG LVL3 (GOWN DISPOSABLE) ×4 IMPLANT
GOWN STRL REUS W/TWL LRG LVL3 (GOWN DISPOSABLE) ×8
HOLDER FOLEY CATH W/STRAP (MISCELLANEOUS) ×2 IMPLANT
KIT RM TURNOVER STRD PROC AR (KITS) ×4 IMPLANT
LABEL OR SOLS (LABEL) ×4 IMPLANT
NDL SAFETY 22GX1.5 (NEEDLE) ×4 IMPLANT
NS IRRIG 1000ML POUR BTL (IV SOLUTION) ×10 IMPLANT
PACK BASIN MAJOR ARMC (MISCELLANEOUS) ×4 IMPLANT
PACK COLON CLEAN CLOSURE (MISCELLANEOUS) ×2 IMPLANT
PAD DRESSING TELFA 3X8 NADH (GAUZE/BANDAGES/DRESSINGS) ×2 IMPLANT
PAD GROUND ADULT SPLIT (MISCELLANEOUS) ×4 IMPLANT
SEPRAFILM MEMBRANE 5X6 (MISCELLANEOUS) ×4 IMPLANT
STAPLER SKIN PROX 35W (STAPLE) ×2 IMPLANT
SUT PDS AB 1 TP1 54 (SUTURE) ×8 IMPLANT
SUT PROLENE 0 CT 1 30 (SUTURE) ×6 IMPLANT
SUT SILK 3-0 (SUTURE) ×2 IMPLANT
SUT VIC AB 3-0 SH 27 (SUTURE)
SUT VIC AB 3-0 SH 27X BRD (SUTURE) ×2 IMPLANT
SUT VICRYL 2 0 18  UND BR (SUTURE) ×2
SUT VICRYL 2 0 18 UND BR (SUTURE) ×2 IMPLANT
SWAB DUAL CULTURE TRANS RED ST (MISCELLANEOUS) ×2 IMPLANT
SYRINGE 10CC LL (SYRINGE) ×4 IMPLANT

## 2015-05-16 NOTE — Op Note (Signed)
Pre-operative Diagnosis: Acute abdomen  Post-operative Diagnosis: Perforated viscus with interloop abscesses  Surgeon: Phoebe Perch   Assistants surgical tech  Surgeon: Jerrol Banana. Burt Knack, MD FACS  Anesthesia: Gen. with endotracheal tube  Procedure: Exploratory laparotomy, incidental appendectomy, irrigation and drainage of multiple interloop abdominal and pelvic abscesses   Procedure Details  The patient was seen again in the Holding Room. The benefits, complications, treatment options, and expected outcomes were discussed with the patient. The risks of bleeding, infection, recurrence of symptoms, failure to resolve symptoms,  bowel injury, any of which could require further surgery were reviewed with the patient.   The patient was taken to Operating Room, identified as Shelly Ponce and the procedure verified.  A Time Out was held and the above information confirmed.  Prior to the induction of general anesthesia, antibiotic prophylaxis was administered. VTE prophylaxis was in place. General endotracheal anesthesia was then administered and tolerated well. After the induction, the abdomen was prepped with Chloraprep and draped in the sterile fashion. The patient was positioned in the supine position. A Foley catheter had been placed.  After appropriate surgical cause a midline incision was utilized (explore the abdominal cavity. Extensive fibular no purulent material was identified as a peel throughout the entire small bowel and large bowel there was a large redundant section of transverse colon that was looped down into the pelvis there was diverticular disease throughout the sigmoid colon and rectosigmoid junction left colon and transverse colon. The appendix was identified and found to be acutely inflamed as a periappendicitis likely secondary to the ongoing infection in the belly. This was removed by dividing the base of the appendix as well as mesoappendix and ligating with 2-0  Vicryl. Bowel was run from the ligament of Treitz which was not inflamed nor involved. This was run down to the ileocecal valve and along the right colon transverse colon which was found to be redundant and down into the pelvis. There was extensive diverticular disease but no obvious perforation no sign of free air bubbles identified. Maximum area of inflammation was in the small bowel and sigmoid colon signifying likely perforated diverticulitis of a long-standing nature. No identifiable site for perforation could be identified therefore resection colostomy was not performed but irrigation was performed with at least 4 L of irrigant which was aspirated after taking cultures.  Again the small bowel was run as was a large bowel and there is no further signs of active perforation.  Wound is then closed over a piece of Seprafilm with running #1 PDS markings of trach skin subcutaneous stitches for a total of 20 cc and then skin staples were placed over a Penrose drain placed into the subcutaneous tissues. A dressing was placed  Sponge lap needle count was correct.  Findings: Long-standing perforation with interloop abscesses, fibular purulent peel without obvious site of perforation. The entire small bowel and large bowel was involved with the exception of the area nearest the ligament of Treitz. The appendix was not ruptured but was removed. Since no obvious site for perforation could be identified it was surmised that this was perforated diverticulitis of a long-standing nature which had sealed and therefore no bowel resection was performed but irrigation and drainage of multiple interloop abscesses was performed.   Estimated Blood Loss: Minimal         Drains: None         Specimens: Appendix and peritoneal fluid cultures        Complications: None  Condition: Stable   [name] E. Burt Knack, MD, FACS

## 2015-05-16 NOTE — Progress Notes (Signed)
Dr. Benjie Karvonen notified that MD from radiology stated patient has perforated bowel and abscesses. Patient is having loose stools. Dr. Benjie Karvonen stated will call surgery.

## 2015-05-16 NOTE — Anesthesia Procedure Notes (Signed)
Procedure Name: Intubation Date/Time: 05/16/2015 8:54 PM Performed by: Eliberto Ivory Pre-anesthesia Checklist: Patient identified, Patient being monitored, Timeout performed, Emergency Drugs available and Suction available Patient Re-evaluated:Patient Re-evaluated prior to inductionOxygen Delivery Method: Circle system utilized Preoxygenation: Pre-oxygenation with 100% oxygen Intubation Type: IV induction Ventilation: Mask ventilation without difficulty Laryngoscope Size: Mac and 3 Grade View: Grade I Tube type: Oral Tube size: 7.0 mm Number of attempts: 1 Airway Equipment and Method: Stylet Placement Confirmation: ETT inserted through vocal cords under direct vision,  positive ETCO2 and breath sounds checked- equal and bilateral Secured at: 19 cm Tube secured with: Tape Dental Injury: Teeth and Oropharynx as per pre-operative assessment

## 2015-05-16 NOTE — Progress Notes (Signed)
Admit patient in the preop area and reviewed the rationale for emergency surgery and the risks in detail.  A family member, identified as Ms. Dean, and as the medical power of attorney, was found in the preop area as well actually in the waiting area. We discussed the procedure itself and the potential for ostomy and ongoing infection etc. and they were understanding of the planned.

## 2015-05-16 NOTE — Consult Note (Signed)
Surgical Consultation  05/16/2015  Shelly Ponce is an 81 y.o. female.   CC: Abdominal pain  HPI: This patient states she's had 3 weeks of abdominal pain worsening over the last few days was brought to the emergency room where signs of sepsis possibly attributable to a urinary tract infection was identified. She has failed to improve and in fact has worsened today a CT scan obtained showed free air and pelvic abscesses of unclear etiology. Patient denies fevers or chills but has documented fevers in the chart. She also states she's had no abdominal surgery but a tubal ligation and an inguinal hernia is documented in the chart. She has had no nausea and a single emesis while trying to drink the contrast she's had loose stools but no melena or hematochezia.  Past Medical History  Diagnosis Date  . Scoliosis   . Arthritis     Past Surgical History  Procedure Laterality Date  . Tonsillectomy  1960  . Colonoscopy  2006  . Tubal ligation  1978  . Hernia repair Right 3/9/    inguinal hernia    Family History  Problem Relation Age of Onset  . Dementia Mother   . Leukemia Father     Social History:  reports that she has never smoked. She has never used smokeless tobacco. She reports that she does not drink alcohol or use illicit drugs.  Allergies:  Allergies  Allergen Reactions  . Macrobid [Nitrofurantoin] Rash  . Sulfa Antibiotics Rash    Medications reviewed.   Review of Systems:   Review of Systems  Constitutional: Negative for fever, chills and weight loss.  HENT: Negative.   Eyes: Negative.   Respiratory: Negative.   Cardiovascular: Negative.   Gastrointestinal: Positive for nausea, vomiting, abdominal pain and diarrhea. Negative for heartburn, constipation, blood in stool and melena.  Genitourinary: Negative for dysuria and urgency.  Musculoskeletal: Negative.   Skin: Negative.   Neurological: Negative.   Endo/Heme/Allergies: Negative.    Psychiatric/Behavioral: Negative.      Physical Exam:  BP 128/68 mmHg  Pulse 91  Temp(Src) 98.8 F (37.1 C) (Oral)  Resp 18  Wt 94 lb (42.638 kg)  SpO2 97%  Physical Exam  Constitutional: She is oriented to person, place, and time.  Thin uncomfortable-appearing female patient with a high respiratory rate and a high heart rate.  HENT:  Head: Normocephalic and atraumatic.  Eyes: Pupils are equal, round, and reactive to light. Right eye exhibits no discharge. Left eye exhibits no discharge. No scleral icterus.  Neck: Normal range of motion. Neck supple.  Cardiovascular: Regular rhythm and normal heart sounds.   Tachycardic  Pulmonary/Chest: Effort normal and breath sounds normal. No respiratory distress. She has no wheezes. She has no rales.  Elevated respiratory rate but no rales or rhonchi  Abdominal: She exhibits distension. There is tenderness. There is rebound and guarding.  No scars visible but distended tympanitic tender to percussion and palpation as well as rebound and guarding signifying an acute surgical abdomen  Musculoskeletal: Normal range of motion. She exhibits edema. She exhibits no tenderness.  Lymphadenopathy:    She has no cervical adenopathy.  Neurological: She is alert and oriented to person, place, and time.  Skin: Skin is warm and dry.  Psychiatric: Mood, affect and judgment normal.  Vitals reviewed.     Results for orders placed or performed during the hospital encounter of 05/15/15 (from the past 48 hour(s))  CBC WITH DIFFERENTIAL     Status: Abnormal  Collection Time: 05/15/15  1:17 PM  Result Value Ref Range   WBC 3.4 (L) 3.6 - 11.0 K/uL   RBC 3.50 (L) 3.80 - 5.20 MIL/uL   Hemoglobin 11.1 (L) 12.0 - 16.0 g/dL   HCT 35.2 35.0 - 47.0 %   MCV 100.4 (H) 80.0 - 100.0 fL   MCH 31.7 26.0 - 34.0 pg   MCHC 31.6 (L) 32.0 - 36.0 g/dL   RDW 16.6 (H) 11.5 - 14.5 %   Platelets 198 150 - 440 K/uL   Neutrophils Relative % 75 %   Lymphocytes Relative 10  %   Monocytes Relative 1 %   Eosinophils Relative 2 %   Basophils Relative 0 %   Band Neutrophils 12 %   Metamyelocytes Relative 0 %   Myelocytes 0 %   Promyelocytes Absolute 0 %   Blasts 0 %   nRBC 0 0 /100 WBC   Other 0 %   Neutro Abs 3.0 1.4 - 6.5 K/uL   Lymphs Abs 0.3 (L) 1.0 - 3.6 K/uL   Monocytes Absolute 0.0 (L) 0.2 - 0.9 K/uL   Eosinophils Absolute 0.1 0 - 0.7 K/uL   Basophils Absolute 0.0 0 - 0.1 K/uL   RBC Morphology OVAL MACROCYTES     Comment: MIXED RBC POPULATION   Smear Review LARGE PLATELETS PRESENT   Blood Culture (routine x 2)     Status: None (Preliminary result)   Collection Time: 05/15/15  1:18 PM  Result Value Ref Range   Specimen Description BLOOD RIGHT ASSIST CONTROL    Special Requests BOTTLES DRAWN AEROBIC AND ANAEROBIC  1CC    Culture NO GROWTH < 24 HOURS    Report Status PENDING   Blood Culture (routine x 2)     Status: None (Preliminary result)   Collection Time: 05/15/15  1:18 PM  Result Value Ref Range   Specimen Description BLOOD LEFT ASSIST CONTROL    Special Requests BOTTLES DRAWN AEROBIC AND ANAEROBIC  1CC    Culture NO GROWTH < 24 HOURS    Report Status PENDING   Lactic acid, plasma     Status: Abnormal   Collection Time: 05/15/15  1:41 PM  Result Value Ref Range   Lactic Acid, Venous 4.8 (HH) 0.5 - 2.0 mmol/L    Comment: CRITICAL RESULT CALLED TO, READ BACK BY AND VERIFIED WITH JANIE BOWEN ON 05/15/15 AT 1416 [facility]   Comprehensive metabolic panel     Status: Abnormal   Collection Time: 05/15/15  3:00 PM  Result Value Ref Range   Sodium 141 135 - 145 mmol/L   Potassium 3.3 (L) 3.5 - 5.1 mmol/L   Chloride 109 101 - 111 mmol/L   CO2 24 22 - 32 mmol/L   Glucose, Bld 109 (H) 65 - 99 mg/dL   BUN 21 (H) 6 - 20 mg/dL   Creatinine, Ser 0.73 0.44 - 1.00 mg/dL   Calcium 7.8 (L) 8.9 - 10.3 mg/dL   Total Protein 5.2 (L) 6.5 - 8.1 g/dL   Albumin 2.4 (L) 3.5 - 5.0 g/dL   AST 30 15 - 41 U/L   ALT 14 14 - 54 U/L   Alkaline Phosphatase 88 38 -  126 U/L   Total Bilirubin 0.6 0.3 - 1.2 mg/dL   GFR calc non Af Amer >60 >60 mL/min   GFR calc Af Amer >60 >60 mL/min    Comment: (NOTE) The eGFR has been calculated using the CKD EPI equation. This calculation has not been validated in  all clinical situations. eGFR's persistently <60 mL/min signify possible Chronic Kidney Disease.    Anion gap 8 5 - 15  Urinalysis complete, with microscopic ([facility] only)     Status: Abnormal   Collection Time: 05/15/15  4:41 PM  Result Value Ref Range   Color, Urine YELLOW (A) YELLOW   APPearance HAZY (A) CLEAR   Glucose, UA NEGATIVE NEGATIVE mg/dL   Bilirubin Urine NEGATIVE NEGATIVE   Ketones, ur NEGATIVE NEGATIVE mg/dL   Specific Gravity, Urine 1.024 1.005 - 1.030   Hgb urine dipstick NEGATIVE NEGATIVE   pH 7.0 5.0 - 8.0   Protein, ur 30 (A) NEGATIVE mg/dL   Nitrite NEGATIVE NEGATIVE   Leukocytes, UA TRACE (A) NEGATIVE   RBC / HPF 6-30 0 - 5 RBC/hpf   WBC, UA 6-30 0 - 5 WBC/hpf   Bacteria, UA NONE SEEN NONE SEEN   Squamous Epithelial / LPF 6-30 (A) NONE SEEN   Mucous PRESENT   Urine culture     Status: None (Preliminary result)   Collection Time: 05/15/15  4:41 PM  Result Value Ref Range   Specimen Description URINE, RANDOM    Special Requests NONE    Culture NO GROWTH < 24 HOURS    Report Status PENDING   Lactic acid, plasma     Status: Abnormal   Collection Time: 05/15/15  5:33 PM  Result Value Ref Range   Lactic Acid, Venous 2.1 (HH) 0.5 - 2.0 mmol/L    Comment: CRITICAL RESULT CALLED TO, READ BACK BY AND VERIFIED WITH JAMIE BOWEN @ 1807 ON 05/15/15 BY [facility]   Lactic acid, plasma     Status: None   Collection Time: 05/15/15  8:08 PM  Result Value Ref Range   Lactic Acid, Venous 1.7 0.5 - 2.0 mmol/L  Basic metabolic panel     Status: Abnormal   Collection Time: 05/16/15  4:45 AM  Result Value Ref Range   Sodium 141 135 - 145 mmol/L   Potassium 3.9 3.5 - 5.1 mmol/L   Chloride 111 101 - 111 mmol/L   CO2 23 22 - 32 mmol/L    Glucose, Bld 97 65 - 99 mg/dL   BUN 15 6 - 20 mg/dL   Creatinine, Ser 0.64 0.44 - 1.00 mg/dL   Calcium 7.9 (L) 8.9 - 10.3 mg/dL   GFR calc non Af Amer >60 >60 mL/min   GFR calc Af Amer >60 >60 mL/min    Comment: (NOTE) The eGFR has been calculated using the CKD EPI equation. This calculation has not been validated in all clinical situations. eGFR's persistently <60 mL/min signify possible Chronic Kidney Disease.    Anion gap 7 5 - 15  CBC     Status: Abnormal   Collection Time: 05/16/15  4:45 AM  Result Value Ref Range   WBC 10.3 3.6 - 11.0 K/uL   RBC 3.53 (L) 3.80 - 5.20 MIL/uL   Hemoglobin 10.9 (L) 12.0 - 16.0 g/dL   HCT 33.5 (L) 35.0 - 47.0 %   MCV 94.9 80.0 - 100.0 fL   MCH 31.1 26.0 - 34.0 pg   MCHC 32.7 32.0 - 36.0 g/dL   RDW 14.3 11.5 - 14.5 %   Platelets 213 150 - 440 K/uL   Dg Abd 1 View  05/15/2015  CLINICAL DATA:  Constipation for 1 week EXAM: ABDOMEN - 1 VIEW COMPARISON:  11/18/2013 FINDINGS: There is normal small bowel gas pattern. Degenerative changes and significant dextroscoliosis upper lumbar spine. No significant colonic stool.  IMPRESSION: Normal small bowel gas pattern. No significant colonic stool. Dextroscoliosis and degenerative changes lumbar spine. Electronically Signed   By: Lahoma Crocker M.D.   On: 05/15/2015 17:10   Ct Abdomen Pelvis W Contrast  05/16/2015  CLINICAL DATA:  Diffuse abdominal and pelvic pain. Nausea and vomiting. Sepsis. Lower urinary tract infection. EXAM: CT ABDOMEN AND PELVIS WITH CONTRAST TECHNIQUE: Multidetector CT imaging of the abdomen and pelvis was performed using the standard protocol following bolus administration of intravenous contrast. CONTRAST:  21m OMNIPAQUE IOHEXOL 300 MG/ML  SOLN COMPARISON:  05/02/2012 FINDINGS: Lower chest: Tiny bilateral pleural effusions are new since prior exam. Mild cardiomegaly is stable. Hepatobiliary: No masses or other significant abnormality. Pancreas: No mass, inflammatory changes, or other  significant abnormality. Spleen: Within normal limits in size and appearance. Adrenals/Urinary Tract: No masses identified. No evidence of hydronephrosis. Tiny left renal cysts and nonobstructive right renal calculi noted. Stomach/Bowel: Small hiatal hernia noted. Proximal small bowel dilatation seen. Wall thickening is seen involving multiple small bowel loops in the lower abdomen and pelvis. Wall thickening is also seen involving the colon diffusely. Diverticulosis seen involving the distal descending and sigmoid colon. A small amount of free air is seen within the anterior lower abdomen and the pelvis, consistent with bowel perforation. Two rim enhancing fluid collections containing gas are also seen within the mid and posterior pelvis measuring 2 x 8 cm on image 57 and 3 x 6.5 cm on image 57. Another low-attenuation fluid collection is seen in the right anterior pelvis measuring 2.9 x 6.7 cm. These are most consistent with abscesses. A ill-defined high attenuation collection is also seen left lower quadrant measuring 3 x 4 cm which may represent extravasated oral contrast, although mass cannot definitely be excluded. Vascular/Lymphatic: No pathologically enlarged lymph nodes. No evidence of abdominal aortic aneurysm. Reproductive: No mass or other significant abnormality. Other: None. Musculoskeletal:  No suspicious bone lesions identified. IMPRESSION: Small amount of free intraperitoneal air, consistent with bowel perforation, and multiple abscesses in lower abdomen and pelvis. Diffuse wall thickening involving distal small bowel loops and colon, consistent with enterocolitis. Left-sided colonic diverticulosis is noted, however small and large bowel wall thickening is much more extensive. Differential diagnosis includes bowel ischemia, infectious or inflammatory enterocolitis, and perforated diverticulitis with secondary involvement of small bowel. Tiny bilateral pleural effusions and small hiatal hernia.  Critical Value/emergent results were called by telephone at the time of interpretation on 05/16/2015 at 6:26 pm to patient's floor nurse April, who verbally acknowledged these results. Electronically Signed   By: JEarle GellM.D.   On: 05/16/2015 18:31   Dg Chest Port 1 View  05/15/2015  CLINICAL DATA:  Fever. EXAM: PORTABLE CHEST 1 VIEW COMPARISON:  None. FINDINGS: The heart size and mediastinal contours are within normal limits. Both lungs are clear. No pneumothorax or pleural effusion is noted. The visualized skeletal structures are unremarkable. IMPRESSION: No acute cardiopulmonary abnormality seen. Electronically Signed   By: JMarijo Conception M.D.   On: 05/15/2015 13:29    Assessment/Plan:  No family present they have been called by the nursing staff.   3 weeks of abdominal pain and a CT scan done today after her admission yesterday that shows small free air bubbles and multiple abscesses some of which are quite large and distant from each other. Had this been a situation where there was only one abscess and extensive diverticulosis of bowel wall thickening one could surmise that this is diverticulitis. However with the findings of multiple  abscesses remote from each other and the etiology being uncertain fact that she has an acute abdomen and elevated respiratory rate and heart rate suggestive of SIRS, I am recommending exploratory laparotomy on an emergent basis this evening. I will discuss this with the family when they arrive. I discussed with the patient the findings and the need for surgery the options of observation the risks of bleeding infection ostomy either colostomy or ileostomy temporary or permanent anastomosis anastomotic leak the need for further surgery and the risks of untreated bowel perforation she understood and agreed to proceed. Again this will be reviewed with her family.  Florene Glen, MD, FACS

## 2015-05-16 NOTE — Progress Notes (Signed)
[place] [facility] [place]    MR#:  [id number]  DATE OF BIRTH:  21-Sep-1932  SUBJECTIVE: Admitted for abdominal pain, diarrhea. Found to have UTI. Came to ER with tachycardia fever and elevated lactic acid. Today she complains of abdominal pain the lower quadrant. Had 3 loose bowel movements last night. No nausea, no vomiting. No dysuria.   CHIEF COMPLAINT:   Chief Complaint  Patient presents with  . Fever  . Diarrhea    REVIEW OF SYSTEMS:   ROS CONSTITUTIONAL: No fever, fatigue or weakness.  EYES: No blurred or double vision.  EARS, NOSE, AND THROAT: No tinnitus or ear pain.  RESPIRATORY: No cough, shortness of breath, wheezing or hemoptysis.  CARDIOVASCULAR: No chest pain, orthopnea, edema.  GASTROINTESTINAL: Abdominal pain in the lower quadrant. Bowel sounds are present. No organomegaly GENITOURINARY: No dysuria, hematuria.  ENDOCRINE: No polyuria, nocturia,  HEMATOLOGY: No anemia, easy bruising or bleeding SKIN: No rash or lesion. MUSCULOSKELETAL: No joint pain or arthritis.   NEUROLOGIC: No tingling, numbness, weakness.  PSYCHIATRY: No anxiety or depression.   DRUG ALLERGIES:   Allergies  Allergen Reactions  . Macrobid [Nitrofurantoin] Rash  . Sulfa Antibiotics Rash    VITALS:  Blood pressure 134/71, pulse 94, temperature 98.5 F (36.9 C), temperature source Oral, resp. rate 20, weight 42.638 kg (94 lb), SpO2 98 %.  PHYSICAL EXAMINATION:  GENERAL:  81 y.o.-year-old patient lying in the bed with no acute distress.  EYES: Pupils equal, round, reactive to light and accommodation. No scleral icterus. Extraocular muscles intact.  HEENT: Head atraumatic, normocephalic. Oropharynx and nasopharynx clear.  NECK:  Supple, no jugular venous distention. No thyroid enlargement, no tenderness.  LUNGS: Normal breath sounds bilaterally, no wheezing, rales,rhonchi or crepitation. No use of accessory muscles of  respiration.  CARDIOVASCULAR: S1, S2 normal. No murmurs, rubs, or gallops.  ABDOMEN: Lower quadrant abdominal tenderness present. Bowel sounds are present. No organomegaly. EXTREMITIES: No pedal edema, cyanosis, or clubbing.  NEUROLOGIC: Cranial nerves II through XII are intact. Muscle strength 5/5 in all extremities. Sensation intact. Gait not checked.  PSYCHIATRIC: The patient is alert and oriented x 3.  SKIN: No obvious rash, lesion, or ulcer.    LABORATORY PANEL:   CBC  Recent Labs Lab 05/16/15 0445  WBC 10.3  HGB 10.9*  HCT 33.5*  PLT 213   ------------------------------------------------------------------------------------------------------------------  Chemistries   Recent Labs Lab 05/15/15 1500 05/16/15 0445  NA 141 141  K 3.3* 3.9  CL 109 111  CO2 24 23  GLUCOSE 109* 97  BUN 21* 15  CREATININE 0.73 0.64  CALCIUM 7.8* 7.9*  AST 30  --   ALT 14  --   ALKPHOS 88  --   BILITOT 0.6  --    ------------------------------------------------------------------------------------------------------------------  Cardiac Enzymes No results for input(s): TROPONINI in the last 168 hours. ------------------------------------------------------------------------------------------------------------------  RADIOLOGY:  Dg Abd 1 View  05/15/2015  CLINICAL DATA:  Constipation for 1 week EXAM: ABDOMEN - 1 VIEW COMPARISON:  11/18/2013 FINDINGS: There is normal small bowel gas pattern. Degenerative changes and significant dextroscoliosis upper lumbar spine. No significant colonic stool. IMPRESSION: Normal small bowel gas pattern. No significant colonic stool. Dextroscoliosis and degenerative changes lumbar spine. Electronically Signed   By: Lahoma Crocker M.D.   On: 05/15/2015 17:10   Dg Chest Port 1 View  05/15/2015  CLINICAL DATA:  Fever. EXAM: PORTABLE CHEST 1 VIEW COMPARISON:  None. FINDINGS: The heart size and mediastinal contours  are within normal limits. Both lungs are clear. No  pneumothorax or pleural effusion is noted. The visualized skeletal structures are unremarkable. IMPRESSION: No acute cardiopulmonary abnormality seen. Electronically Signed   By: Marijo Conception, M.D.   On: 05/15/2015 13:29    EKG:   Orders placed or performed in visit on 08/13/13  . EKG 12-Lead    ASSESSMENT AND PLAN:   55. 81 year old the female patient admitted for sepsis; due to UTI. Continue IV hydration, IV Rocephin and follow urine culture data.    #2 abdominal pain; and her diarrhea check for CT abdomen for possible diverticulitis.; Patient still has abdominal pain. Likely also due to constipation which is resolving at this time.  3. DVT prophylaxis.   All the records are reviewed and case discussed with Care Management/Social Workerr. Management plans discussed with the patient, family and they are in agreement.  CODE STATUS: DNR  TOTAL TIME TAKING CARE OF THIS PATIENT: 35 minutes.   POSSIBLE D/C IN  DAYS, DEPENDING ON CLINICAL CONDITION.   Epifanio Lesches M.D on 05/16/2015 at 10:33 AM  Between 7am to 6pm - Pager - [phone]  After 6pm go to [email or web address] - password [email or web address] [facility]  Office  [phone]  CC: Primary care physician; Adin Hector, MD   Note: This dictation was prepared with Dragon dictation along with smaller phrase technology. Any transcriptional errors that result from this process are unintentional.

## 2015-05-16 NOTE — Progress Notes (Signed)
Pt arrived on unit, report received at bedside from PACU nurse. VSS  Shelly Ponce

## 2015-05-16 NOTE — Transfer of Care (Signed)
Immediate Anesthesia Transfer of Care Note  Patient: Shelly Ponce  Procedure(s) Performed: Procedure(s): EXPLORATORY LAPAROTOMY (N/A) SMALL BOWEL RESECTION (N/A)  Patient Location: PACU  Anesthesia Type:General  Level of Consciousness: Alert, Awake, Oriented  Airway & Oxygen Therapy: Patient Spontanous Breathing  Post-op Assessment: Report given to RN  Post vital signs: Reviewed and stable  Last Vitals:  Filed Vitals:   05/16/15 1947 05/16/15 2156  BP: 146/79 146/69  Pulse: 106 31  Temp: 36.9 C 38.6 C  Resp: 18 25    Complications: No apparent anesthesia complications

## 2015-05-16 NOTE — Progress Notes (Addendum)
Notified Miss Gerlene Burdock, RN since Dr Barnetta Chapel is in the OR of findings of CT scan of SB perforation. She will relay findings and surgery will see patient tonight.

## 2015-05-16 NOTE — Progress Notes (Signed)
Dr. Vianne Bulls said to d/c tele.

## 2015-05-16 NOTE — Anesthesia Preprocedure Evaluation (Addendum)
Anesthesia Evaluation  Patient identified by MRN, date of birth, ID band Patient awake    Reviewed: Allergy & Precautions, NPO status , Patient's Chart, lab work & pertinent test results, reviewed documented beta blocker date and time   Airway Mallampati: II  TM Distance: >3 FB     Dental  (+) Chipped   Pulmonary           Cardiovascular      Neuro/Psych    GI/Hepatic   Endo/Other    Renal/GU      Musculoskeletal  (+) Arthritis ,   Abdominal   Peds  Hematology   Anesthesia Other Findings EKG ok in 2015. Strip OK now. Anemic 10.9.  Reproductive/Obstetrics                            Anesthesia Physical Anesthesia Plan  ASA: III  Anesthesia Plan: General   Post-op Pain Management:    Induction: Intravenous and Rapid sequence  Airway Management Planned: Oral ETT  Additional Equipment:   Intra-op Plan:   Post-operative Plan:   Informed Consent: I have reviewed the patients History and Physical, chart, labs and discussed the procedure including the risks, benefits and alternatives for the proposed anesthesia with the patient or authorized representative who has indicated his/her understanding and acceptance.     Plan Discussed with: CRNA  Anesthesia Plan Comments:         Anesthesia Quick Evaluation

## 2015-05-17 LAB — URINE CULTURE: CULTURE: NO GROWTH

## 2015-05-17 MED ORDER — ACETAMINOPHEN 500 MG PO TABS
1000.0000 mg | ORAL_TABLET | Freq: Four times a day (QID) | ORAL | Status: DC
  Administered 2015-05-17 – 2015-05-19 (×6): 1000 mg via ORAL
  Filled 2015-05-17 (×10): qty 2

## 2015-05-17 MED ORDER — METRONIDAZOLE IN NACL 5-0.79 MG/ML-% IV SOLN
500.0000 mg | Freq: Three times a day (TID) | INTRAVENOUS | Status: DC
Start: 2015-05-17 — End: 2015-05-18
  Administered 2015-05-17 – 2015-05-18 (×4): 500 mg via INTRAVENOUS
  Filled 2015-05-17 (×5): qty 100

## 2015-05-17 MED ORDER — KETOROLAC TROMETHAMINE 15 MG/ML IJ SOLN
15.0000 mg | Freq: Four times a day (QID) | INTRAMUSCULAR | Status: DC
  Administered 2015-05-17 – 2015-05-18 (×6): 15 mg via INTRAVENOUS
  Filled 2015-05-17 (×6): qty 1

## 2015-05-17 MED ORDER — ACETAMINOPHEN 325 MG PO TABS: 650.0000 mg | ORAL_TABLET | Freq: Four times a day (QID) | ORAL | Status: DC

## 2015-05-17 MED ORDER — MORPHINE SULFATE (PF) 2 MG/ML IV SOLN
2.0000 mg | INTRAVENOUS | Status: DC | PRN
  Administered 2015-05-17 – 2015-05-20 (×8): 2 mg via INTRAVENOUS
  Filled 2015-05-17 (×8): qty 1

## 2015-05-17 NOTE — Progress Notes (Signed)
[place] [facility] [place]    MR#:  [id number]  DATE OF BIRTH:  01-16-1933  SUBJECTIVE: Patient had abdominal pain yesterday, abdominal CAT scan showed perforated bowel. Patient had emergency ex-lap for perforated viscus with interloop abscess. Postoperatively patient is doing better and her abdominal pain. Has NG tube  To  LIS. Had temp of 101 Flast night.   CHIEF COMPLAINT:   Chief Complaint  Patient presents with  . Fever  . Diarrhea    REVIEW OF SYSTEMS:   Review of Systems  Constitutional: Negative for fever and chills.  HENT: Negative for hearing loss.   Eyes: Negative for blurred vision, double vision and photophobia.  Respiratory: Negative for cough, hemoptysis and shortness of breath.   Cardiovascular: Negative for palpitations, orthopnea and leg swelling.  Gastrointestinal: Positive for nausea and abdominal pain. Negative for vomiting and diarrhea.  Genitourinary: Negative for dysuria and urgency.  Musculoskeletal: Negative for myalgias and neck pain.  Skin: Negative for rash.  Neurological: Negative for dizziness, focal weakness, seizures, weakness and headaches.  Psychiatric/Behavioral: Negative for memory loss. The patient does not have insomnia.      DRUG ALLERGIES:   Allergies  Allergen Reactions  . Macrobid [Nitrofurantoin] Rash  . Sulfa Antibiotics Rash    VITALS:  Blood pressure 143/80, pulse 91, temperature 97.4 F (36.3 C), temperature source Oral, resp. rate 18, height 5\' 2"  (1.575 m), weight 42.593 kg (93 lb 14.4 oz), SpO2 98 %.  PHYSICAL EXAMINATION:  GENERAL:  81 y.o.-year-old patient lying in the bed with no acute distress.  EYES: Pupils equal, round, reactive to light and accommodation. No scleral icterus. Extraocular muscles intact.  HEENT: Head atraumatic, normocephalic. Oropharynx and nasopharynx clear. NG tube in place. NECK:  Supple, no jugular venous distention. No thyroid  enlargement, no tenderness.  LUNGS: Normal breath sounds bilaterally, no wheezing, rales,rhonchi or crepitation. No use of accessory muscles of respiration.  CARDIOVASCULAR: S1, S2 normal. No murmurs, rubs, or gallops.  ABDOMEN: s/p surgery/operated site looks clean,. EXTREMITIES: No pedal edema, cyanosis, or clubbing.  NEUROLOGIC: Cranial nerves II through XII are intact. Muscle strength 5/5 in all extremities. Sensation intact. Gait not checked.  PSYCHIATRIC: The patient is alert and oriented x 3.  SKIN: No obvious rash, lesion, or ulcer.    LABORATORY PANEL:   CBC  Recent Labs Lab 05/16/15 0445  WBC 10.3  HGB 10.9*  HCT 33.5*  PLT 213   ------------------------------------------------------------------------------------------------------------------  Chemistries   Recent Labs Lab 05/15/15 1500 05/16/15 0445  NA 141 141  K 3.3* 3.9  CL 109 111  CO2 24 23  GLUCOSE 109* 97  BUN 21* 15  CREATININE 0.73 0.64  CALCIUM 7.8* 7.9*  AST 30  --   ALT 14  --   ALKPHOS 88  --   BILITOT 0.6  --    ------------------------------------------------------------------------------------------------------------------  Cardiac Enzymes No results for input(s): TROPONINI in the last 168 hours. ------------------------------------------------------------------------------------------------------------------  RADIOLOGY:  Dg Abd 1 View  05/15/2015  CLINICAL DATA:  Constipation for 1 week EXAM: ABDOMEN - 1 VIEW COMPARISON:  11/18/2013 FINDINGS: There is normal small bowel gas pattern. Degenerative changes and significant dextroscoliosis upper lumbar spine. No significant colonic stool. IMPRESSION: Normal small bowel gas pattern. No significant colonic stool. Dextroscoliosis and degenerative changes lumbar spine. Electronically Signed   By: Lahoma Crocker M.D.   On: 05/15/2015 17:10   Ct Abdomen Pelvis W Contrast  05/16/2015  CLINICAL DATA:  Diffuse abdominal and pelvic pain. Nausea and  vomiting. Sepsis. Lower urinary tract infection. EXAM: CT ABDOMEN AND PELVIS WITH CONTRAST TECHNIQUE: Multidetector CT imaging of the abdomen and pelvis was performed using the standard protocol following bolus administration of intravenous contrast. CONTRAST:  40mL OMNIPAQUE IOHEXOL 300 MG/ML  SOLN COMPARISON:  05/02/2012 FINDINGS: Lower chest: Tiny bilateral pleural effusions are new since prior exam. Mild cardiomegaly is stable. Hepatobiliary: No masses or other significant abnormality. Pancreas: No mass, inflammatory changes, or other significant abnormality. Spleen: Within normal limits in size and appearance. Adrenals/Urinary Tract: No masses identified. No evidence of hydronephrosis. Tiny left renal cysts and nonobstructive right renal calculi noted. Stomach/Bowel: Small hiatal hernia noted. Proximal small bowel dilatation seen. Wall thickening is seen involving multiple small bowel loops in the lower abdomen and pelvis. Wall thickening is also seen involving the colon diffusely. Diverticulosis seen involving the distal descending and sigmoid colon. A small amount of free air is seen within the anterior lower abdomen and the pelvis, consistent with bowel perforation. Two rim enhancing fluid collections containing gas are also seen within the mid and posterior pelvis measuring 2 x 8 cm on image 57 and 3 x 6.5 cm on image 57. Another low-attenuation fluid collection is seen in the right anterior pelvis measuring 2.9 x 6.7 cm. These are most consistent with abscesses. A ill-defined high attenuation collection is also seen left lower quadrant measuring 3 x 4 cm which may represent extravasated oral contrast, although mass cannot definitely be excluded. Vascular/Lymphatic: No pathologically enlarged lymph nodes. No evidence of abdominal aortic aneurysm. Reproductive: No mass or other significant abnormality. Other: None. Musculoskeletal:  No suspicious bone lesions identified. IMPRESSION: Small amount of free  intraperitoneal air, consistent with bowel perforation, and multiple abscesses in lower abdomen and pelvis. Diffuse wall thickening involving distal small bowel loops and colon, consistent with enterocolitis. Left-sided colonic diverticulosis is noted, however small and large bowel wall thickening is much more extensive. Differential diagnosis includes bowel ischemia, infectious or inflammatory enterocolitis, and perforated diverticulitis with secondary involvement of small bowel. Tiny bilateral pleural effusions and small hiatal hernia. Critical Value/emergent results were called by telephone at the time of interpretation on 05/16/2015 at 6:26 pm to patient's floor nurse April, who verbally acknowledged these results. Electronically Signed   By: Earle Gell M.D.   On: 05/16/2015 18:31   Dg Chest Port 1 View  05/15/2015  CLINICAL DATA:  Fever. EXAM: PORTABLE CHEST 1 VIEW COMPARISON:  None. FINDINGS: The heart size and mediastinal contours are within normal limits. Both lungs are clear. No pneumothorax or pleural effusion is noted. The visualized skeletal structures are unremarkable. IMPRESSION: No acute cardiopulmonary abnormality seen. Electronically Signed   By: Marijo Conception, M.D.   On: 05/15/2015 13:29    EKG:   Orders placed or performed during the hospital encounter of 05/15/15  . EKG 12-Lead  . EKG 12-Lead    ASSESSMENT AND PLAN:   65. 81 year old the female patient admitted for sepsis; due to UTI and intra-abdominal sepsis and lactic acidosis;   #2 abdominal pain; due to bowel perforation with multiple abscesses: Status post emergency ex-lap ; continue nothing by mouth, IV hydration, pressure surgery following. For colitis patient already on Zosyn and Flagyl also because of persistent fevers  3. DVT prophylaxis.   All the records are reviewed and case discussed with Care Management/Social Workerr. Management plans discussed with the patient, family and they are in agreement.  CODE  STATUS: DNR  TOTAL TIME TAKING  CARE OF THIS PATIENT: 35 minutes.   POSSIBLE D/C IN  DAYS, DEPENDING ON CLINICAL CONDITION.   Epifanio Lesches M.D on 05/17/2015 at 10:39 AM  Between 7am to 6pm - Pager - [phone]  After 6pm go to [email or web address] - password [email or web address] [facility]  Office  [phone]  CC: Primary care physician; Adin Hector, MD   Note: This dictation was prepared with Dragon dictation along with smaller phrase technology. Any transcriptional errors that result from this process are unintentional.

## 2015-05-17 NOTE — Anesthesia Postprocedure Evaluation (Signed)
Anesthesia Post Note  Patient: ROOSEVELT GORDNER  Procedure(s) Performed: Procedure(s) (LRB): EXPLORATORY LAPAROTOMY, DRAINAGE OF INTRABDOMINAL ABSCESS (N/A) APPENDECTOMY  Patient location during evaluation: PACU Anesthesia Type: General Level of consciousness: awake and alert Pain management: pain level controlled Vital Signs Assessment: post-procedure vital signs reviewed and stable Respiratory status: spontaneous breathing, nonlabored ventilation and respiratory function stable Cardiovascular status: blood pressure returned to baseline and stable Postop Assessment: No signs of nausea or vomiting Anesthetic complications: no    Last Vitals:  Filed Vitals:   05/17/15 0058 05/17/15 0453  BP: 137/78 143/80  Pulse: 88 91  Temp: 36.8 C 36.3 C  Resp: 18 18    Last Pain:  Filed Vitals:   05/17/15 1142  PainSc: [place]

## 2015-05-17 NOTE — Progress Notes (Signed)
81 yr old POD#1 from Ex Lap and washout for multiple intraabdominal abscess likely from diverticulitis.  Patient states doing fairly well this AM.  Complaining of her throat hurting and some abdominal pain.  She does walk with a cane for balance at home.   Filed Vitals:   05/17/15 0058 05/17/15 0453  BP: 137/78 143/80  Pulse: 88 91  Temp: 98.2 F (36.8 C) 97.4 F (36.3 C)  Resp: 18 18   PE:  Gen: NAD, AAOx3 Abd: soft, mild distension, incision clean and dry with minimal serous drainage from penrose.   CBC Latest Ref Rng 05/16/2015 05/15/2015 08/13/2013  WBC 3.6 - 11.0 K/uL 10.3 3.4(L) 6.7  Hemoglobin 12.0 - 16.0 g/dL 10.9(L) 11.1(L) 12.1  Hematocrit 35.0 - 47.0 % 33.5(L) 35.2 37.0  Platelets 150 - 440 K/uL 213 198 219   CMP Latest Ref Rng 05/16/2015 05/15/2015 08/13/2013  Glucose 65 - 99 mg/dL 97 109(H) 75  BUN 6 - 20 mg/dL 15 21(H) 19(H)  Creatinine 0.44 - 1.00 mg/dL 0.64 0.73 0.75  Sodium 135 - 145 mmol/L 141 141 141  Potassium 3.5 - 5.1 mmol/L 3.9 3.3(L) 4.0  Chloride 101 - 111 mmol/L 111 109 108(H)  CO2 22 - 32 mmol/L 23 24 29   Calcium 8.9 - 10.3 mg/dL 7.9(L) 7.8(L) 9.0  Total Protein 6.5 - 8.1 g/dL - 5.2(L) -  Total Bilirubin 0.3 - 1.2 mg/dL - 0.6 -  Alkaline Phos 38 - 126 U/L - 88 -  AST 15 - 41 U/L - 30 -  ALT 14 - 54 U/L - 14 -    A/P: 81 yr old female s/p Ex lap and washout of abscesses  Discontinue NG, ice chips today Pain: added toradol and tylenol, continue IV morphine prns PT consult for deconditioning

## 2015-05-18 ENCOUNTER — Encounter: Payer: Self-pay | Admitting: Surgery

## 2015-05-18 MED ORDER — DIPHENHYDRAMINE HCL 25 MG PO CAPS
50.0000 mg | ORAL_CAPSULE | Freq: Every evening | ORAL | Status: DC | PRN
  Administered 2015-05-18 – 2015-05-22 (×5): 50 mg via ORAL
  Filled 2015-05-18 (×5): qty 2

## 2015-05-18 MED ORDER — ONDANSETRON HCL 4 MG/2ML IJ SOLN
4.0000 mg | Freq: Four times a day (QID) | INTRAMUSCULAR | Status: DC | PRN
  Administered 2015-05-18 – 2015-05-23 (×11): 4 mg via INTRAVENOUS
  Filled 2015-05-18 (×7): qty 2

## 2015-05-18 NOTE — Care Management Important Message (Signed)
Important Message  Patient Details  Name: Shelly Ponce MRN: [id number] Date of Birth: 12-Nov-1932   Medicare Important Message Given:  Yes    Juliann Pulse A Letanya Froh 05/18/2015, 10:37 AM

## 2015-05-18 NOTE — Progress Notes (Signed)
2 Days Post-Op   Subjective:  81 year old female status post export her laparotomy. Patient states she is doing better. She's had a bowel movement over the last 24 hours and is passing flatus. She states she is hungry. Pain is well controlled with when necessary medications she denies any fevers, chills, nausea, vomiting.  Vital signs in last 24 hours: Temp:  [97.9 F (36.6 C)-98.6 F (37 C)] 97.9 F (36.6 C) (11/28 0655) Pulse Rate:  [72-76] 72 (11/28 1128) Resp:  [16-18] 18 (11/28 0655) BP: (140-141)/(69-84) 141/84 mmHg (11/28 0655) SpO2:  [95 %-100 %] 95 % (11/28 1128) Last BM Date: 05/16/15  Intake/Output from previous day: 11/27 0701 - 11/28 0700 In: 2324 [I.V.:2284; IV Piggyback:40] Out: 755 [Urine:725; Emesis/NG output:30]  Physical exam: Gen.: Resting in a chair in no acute distress Chest: Clear to auscultation Heart: Regular rate and rhythm GI: Abdomen is soft, nondistended, appropriately tender to palpation along her midline incision with Penrose drain in place. No evidence of spreading erythema or purulent drainage.  Lab Results:  CBC  Recent Labs  05/16/15 0445  WBC 10.3  HGB 10.9*  HCT 33.5*  PLT 213   CMP     Component Value Date/Time   NA 141 05/16/2015 0445   NA 141 08/13/2013 1330   K 3.9 05/16/2015 0445   K 4.0 08/13/2013 1330   CL 111 05/16/2015 0445   CL 108* 08/13/2013 1330   CO2 23 05/16/2015 0445   CO2 29 08/13/2013 1330   GLUCOSE 97 05/16/2015 0445   GLUCOSE 75 08/13/2013 1330   BUN 15 05/16/2015 0445   BUN 19* 08/13/2013 1330   CREATININE 0.64 05/16/2015 0445   CREATININE 0.75 08/13/2013 1330   CALCIUM 7.9* 05/16/2015 0445   CALCIUM 9.0 08/13/2013 1330   PROT 5.2* 05/15/2015 1500   ALBUMIN 2.4* 05/15/2015 1500   AST 30 05/15/2015 1500   ALT 14 05/15/2015 1500   ALKPHOS 88 05/15/2015 1500   BILITOT 0.6 05/15/2015 1500   GFRNONAA >60 05/16/2015 0445   GFRNONAA >60 08/13/2013 1330   GFRAA >60 05/16/2015 0445   GFRAA >60  08/13/2013 1330   PT/INR No results for input(s): LABPROT, INR in the last 72 hours.  Studies/Results: Ct Abdomen Pelvis W Contrast  05/16/2015  CLINICAL DATA:  Diffuse abdominal and pelvic pain. Nausea and vomiting. Sepsis. Lower urinary tract infection. EXAM: CT ABDOMEN AND PELVIS WITH CONTRAST TECHNIQUE: Multidetector CT imaging of the abdomen and pelvis was performed using the standard protocol following bolus administration of intravenous contrast. CONTRAST:  68mL OMNIPAQUE IOHEXOL 300 MG/ML  SOLN COMPARISON:  05/02/2012 FINDINGS: Lower chest: Tiny bilateral pleural effusions are new since prior exam. Mild cardiomegaly is stable. Hepatobiliary: No masses or other significant abnormality. Pancreas: No mass, inflammatory changes, or other significant abnormality. Spleen: Within normal limits in size and appearance. Adrenals/Urinary Tract: No masses identified. No evidence of hydronephrosis. Tiny left renal cysts and nonobstructive right renal calculi noted. Stomach/Bowel: Small hiatal hernia noted. Proximal small bowel dilatation seen. Wall thickening is seen involving multiple small bowel loops in the lower abdomen and pelvis. Wall thickening is also seen involving the colon diffusely. Diverticulosis seen involving the distal descending and sigmoid colon. A small amount of free air is seen within the anterior lower abdomen and the pelvis, consistent with bowel perforation. Two rim enhancing fluid collections containing gas are also seen within the mid and posterior pelvis measuring 2 x 8 cm on image 57 and 3 x 6.5 cm on image 57. Another  low-attenuation fluid collection is seen in the right anterior pelvis measuring 2.9 x 6.7 cm. These are most consistent with abscesses. A ill-defined high attenuation collection is also seen left lower quadrant measuring 3 x 4 cm which may represent extravasated oral contrast, although mass cannot definitely be excluded. Vascular/Lymphatic: No pathologically enlarged  lymph nodes. No evidence of abdominal aortic aneurysm. Reproductive: No mass or other significant abnormality. Other: None. Musculoskeletal:  No suspicious bone lesions identified. IMPRESSION: Small amount of free intraperitoneal air, consistent with bowel perforation, and multiple abscesses in lower abdomen and pelvis. Diffuse wall thickening involving distal small bowel loops and colon, consistent with enterocolitis. Left-sided colonic diverticulosis is noted, however small and large bowel wall thickening is much more extensive. Differential diagnosis includes bowel ischemia, infectious or inflammatory enterocolitis, and perforated diverticulitis with secondary involvement of small bowel. Tiny bilateral pleural effusions and small hiatal hernia. Critical Value/emergent results were called by telephone at the time of interpretation on 05/16/2015 at 6:26 pm to patient's floor nurse April, who verbally acknowledged these results. Electronically Signed   By: Earle Gell M.D.   On: 05/16/2015 18:31    Assessment/Plan: 81 year old female postop day #2 status post export her laparotomy. Appears to be recovering well. Today we will remove her Foley catheter and saline lock her IV. Advance diet to clear liquids. Encourage ambulation and physical therapy encourage incentive spirometer usage. Appreciate internal medicine assistance with her multiple medical problems, however surgery will come primary to work towards her final disposition. Patient will likely require SNF on discharge.  Clayburn Pert, MD FACS General Surgeon [facility]

## 2015-05-18 NOTE — Evaluation (Signed)
Physical Therapy Evaluation Patient Details Name: Shelly Ponce MRN: [id number] DOB: 02/10/1933 Today's Date: 05/18/2015   History of Present Illness  presented to ER with abdominal pain, fever; admitted with sepsis related to UTI and perforated bowel with abscesses.  Status post ex-lap with appendectomy, I and D of multiple abdominal and pelvic abscesses 11/26.  Clinical Impression  Upon evaluation, patient alert and oriented, follows all commands and demonstrates good insight/safety awareness.  Bilat UE/LE strength grossly WFL for basic transfers and mobility, generally guarded due to pain in abdomen with movement.  Currently requiring min/mod assist for bed mobility; min assist for sit/stand, basic transfers and very short-distance gait (5') with RW.  No buckling or LOB, but additional distance/activity limited by pain, fatigue and multiple episodes of bowel incontinence (RN informed/aware).  Pain rated 6/10 throughout session.  Patient motivated to participate as able, but lacks strength/endurance necessary for safe return home alone at this time. Would benefit from skilled PT to address above deficits and promote optimal return to PLOF; recommend transition to STR upon discharge from acute hospitalization.     Follow Up Recommendations SNF    Equipment Recommendations       Recommendations for Other Services       Precautions / Restrictions Precautions Precautions: Fall Restrictions Weight Bearing Restrictions: No      Mobility  Bed Mobility Overal bed mobility: Needs Assistance Bed Mobility: Rolling;Supine to Sit Rolling: Modified independent (Device/Increase time) (heavy use of bedrails)   Supine to sit: Min assist;Mod assist     General bed mobility comments: assist for truncal elevation due to pain with movement  Transfers Overall transfer level: Needs assistance Equipment used: Rolling walker (2 wheeled) Transfers: Sit to/from Stand Sit to Stand: Min assist          General transfer comment: cuing for hand placement  Ambulation/Gait Ambulation/Gait assistance: Min assist Ambulation Distance (Feet): 5 Feet Assistive device: Rolling walker (2 wheeled)       General Gait Details: bed/chair with RW, min assist; forward flexed posture, short-shuffling steps.  Additional distance limited by pain/fatigue  Stairs            Wheelchair Mobility    Modified Rankin (Stroke Patients Only)       Balance Overall balance assessment: Needs assistance Sitting-balance support: No upper extremity supported;Feet supported Sitting balance-Leahy Scale: Good     Standing balance support: Bilateral upper extremity supported Standing balance-Leahy Scale: Fair                               Pertinent Vitals/Pain Pain Assessment: 0-10 Pain Score: 6  Pain Location: abdomen Pain Descriptors / Indicators: Aching Pain Intervention(s): Limited activity within patient's tolerance;Monitored during session;Repositioned    Home Living Family/patient expects to be discharged to:: Private residence Living Arrangements: Alone   Type of Home: House Home Access: Stairs to enter Entrance Stairs-Rails: None Entrance Stairs-Number of Steps: 1 Home Layout: One level Home Equipment: Environmental consultant - 2 wheels;Cane - single point      Prior Function Level of Independence: Independent with assistive device(s)         Comments: Indep with use of SPC for household and community mobility; + driving.  Does endorse approx 2 falls in previous six months (unable to describe mechanism)     Hand Dominance        Extremity/Trunk Assessment   Upper Extremity Assessment: Overall WFL for tasks assessed (guarded due to  pain with movement, but grossly at least 4/5)           Lower Extremity Assessment: Overall WFL for tasks assessed (guarded due to pain with movement, but grossly at least 4/5)         Communication   Communication: No difficulties   Cognition Arousal/Alertness: Awake/alert Behavior During Therapy: WFL for tasks assessed/performed Overall Cognitive Status: Within Functional Limits for tasks assessed                      General Comments General comments (skin integrity, edema, etc.): abdominal incision with honeycomb dressing, mild (old) sanguinous drainage top and bottom of incision    Exercises Other Exercises Other Exercises: Patient noted with bowel incontinence upon arrival to room; rolling bilat, mod indep with heavy use of bedrails; dep for hygiene and clothing/linen change Other Exercises: Toilet transfer to [facility], SPT with RW, cga/min assist--slow and deliberate, but steady without buckling or LOB.  Decreased LE power and eccentric control noted with stand to sit to [facility]      Assessment/Plan    PT Assessment Patient needs continued PT services  PT Diagnosis Difficulty walking;Generalized weakness   PT Problem List Decreased strength;Decreased range of motion;Decreased balance;Decreased activity tolerance;Decreased mobility;Decreased knowledge of use of DME;Decreased safety awareness;Decreased knowledge of precautions;Pain;Decreased skin integrity  PT Treatment Interventions DME instruction;Gait training;Stair training;Functional mobility training;Therapeutic activities;Therapeutic exercise;Balance training;Patient/family education   PT Goals (Current goals can be found in the Care Plan section) Acute Rehab PT Goals Patient Stated Goal: "to move around a little" PT Goal Formulation: With patient Time For Goal Achievement: 06/01/15 Potential to Achieve Goals: Good    Frequency Min 2X/week   Barriers to discharge Decreased caregiver support      Co-evaluation               End of Session   Activity Tolerance: Patient tolerated treatment well Patient left: with nursing/sitter in room (on [facility] with CNA at bedside; alarm in place, CNA to connect once returned to chair) Nurse Communication:  Mobility status         Time: [id number] PT Time Calculation (min) (ACUTE ONLY): 34 min   Charges:   PT Evaluation $Initial PT Evaluation Tier I: 1 Procedure PT Treatments $Therapeutic Activity: 8-22 mins   PT G Codes:       Jennalyn Cawley H. Owens Shark, PT, DPT, NCS 05/18/2015, 11:38 AM [phone]

## 2015-05-18 NOTE — Progress Notes (Signed)
[facility] [place] NAME: Shelly Ponce    MR#:  [id number]  DATE OF BIRTH:  [birth year]  SUBJECTIVE: Status post surgery for perforated bowel. Postop day 2. NG tube is out. No fever. Patient doing well. No abdominal pain. No nausea, no vomiting.    CHIEF COMPLAINT:   Chief Complaint  Patient presents with  . Fever  . Diarrhea    REVIEW OF SYSTEMS:   Review of Systems  Constitutional: Negative for fever and chills.  HENT: Negative for hearing loss.   Eyes: Negative for blurred vision, double vision and photophobia.  Respiratory: Negative for cough, hemoptysis and shortness of breath.   Cardiovascular: Negative for palpitations, orthopnea and leg swelling.  Gastrointestinal: Negative for nausea, vomiting, abdominal pain and diarrhea.  Genitourinary: Negative for dysuria and urgency.  Musculoskeletal: Negative for myalgias and neck pain.  Skin: Negative for rash.  Neurological: Negative for dizziness, focal weakness, seizures, weakness and headaches.  Psychiatric/Behavioral: Negative for memory loss. The patient does not have insomnia.      DRUG ALLERGIES:   Allergies  Allergen Reactions  . Macrobid [Nitrofurantoin] Rash  . Sulfa Antibiotics Rash    VITALS:  Blood pressure 141/84, pulse 72, temperature 97.9 F (36.6 C), temperature source Oral, resp. rate 18, height 5\' 2"  (1.575 m), weight 42.593 kg (93 lb 14.4 oz), SpO2 95 %.  PHYSICAL EXAMINATION:  GENERAL:  81 y.o.-year-old patient lying in the bed with no acute distress.  EYES: Pupils equal, round, reactive to light and accommodation. No scleral icterus. Extraocular muscles intact.  HEENT: Head atraumatic, normocephalic. Oropharynx and nasopharynx clear. NG tube in place. NECK:  Supple, no jugular venous distention. No thyroid enlargement, no tenderness.  LUNGS: Normal breath sounds bilaterally, no wheezing, rales,rhonchi or crepitation. No use of accessory muscles of  respiration.  CARDIOVASCULAR: S1, S2 normal. No murmurs, rubs, or gallops.  ABDOMEN: s/p surgery/operated site looks clean,. EXTREMITIES: No pedal edema, cyanosis, or clubbing.  NEUROLOGIC: Cranial nerves II through XII are intact. Muscle strength 5/5 in all extremities. Sensation intact. Gait not checked.  PSYCHIATRIC: The patient is alert and oriented x 3.  SKIN: No obvious rash, lesion, or ulcer.    LABORATORY PANEL:   CBC  Recent Labs Lab 05/16/15 0445  WBC 10.3  HGB 10.9*  HCT 33.5*  PLT 213   ------------------------------------------------------------------------------------------------------------------  Chemistries   Recent Labs Lab 05/15/15 1500 05/16/15 0445  NA 141 141  K 3.3* 3.9  CL 109 111  CO2 24 23  GLUCOSE 109* 97  BUN 21* 15  CREATININE 0.73 0.64  CALCIUM 7.8* 7.9*  AST 30  --   ALT 14  --   ALKPHOS 88  --   BILITOT 0.6  --    ------------------------------------------------------------------------------------------------------------------  Cardiac Enzymes No results for input(s): TROPONINI in the last 168 hours. ------------------------------------------------------------------------------------------------------------------  RADIOLOGY:  Ct Abdomen Pelvis W Contrast  05/16/2015  CLINICAL DATA:  Diffuse abdominal and pelvic pain. Nausea and vomiting. Sepsis. Lower urinary tract infection. EXAM: CT ABDOMEN AND PELVIS WITH CONTRAST TECHNIQUE: Multidetector CT imaging of the abdomen and pelvis was performed using the standard protocol following bolus administration of intravenous contrast. CONTRAST:  51mL OMNIPAQUE IOHEXOL 300 MG/ML  SOLN COMPARISON:  05/02/2012 FINDINGS: Lower chest: Tiny bilateral pleural effusions are new since prior exam. Mild cardiomegaly is stable. Hepatobiliary: No masses or other significant abnormality. Pancreas: No mass, inflammatory changes, or other significant abnormality. Spleen: Within normal limits in size and  appearance.  Adrenals/Urinary Tract: No masses identified. No evidence of hydronephrosis. Tiny left renal cysts and nonobstructive right renal calculi noted. Stomach/Bowel: Small hiatal hernia noted. Proximal small bowel dilatation seen. Wall thickening is seen involving multiple small bowel loops in the lower abdomen and pelvis. Wall thickening is also seen involving the colon diffusely. Diverticulosis seen involving the distal descending and sigmoid colon. A small amount of free air is seen within the anterior lower abdomen and the pelvis, consistent with bowel perforation. Two rim enhancing fluid collections containing gas are also seen within the mid and posterior pelvis measuring 2 x 8 cm on image 57 and 3 x 6.5 cm on image 57. Another low-attenuation fluid collection is seen in the right anterior pelvis measuring 2.9 x 6.7 cm. These are most consistent with abscesses. A ill-defined high attenuation collection is also seen left lower quadrant measuring 3 x 4 cm which may represent extravasated oral contrast, although mass cannot definitely be excluded. Vascular/Lymphatic: No pathologically enlarged lymph nodes. No evidence of abdominal aortic aneurysm. Reproductive: No mass or other significant abnormality. Other: None. Musculoskeletal:  No suspicious bone lesions identified. IMPRESSION: Small amount of free intraperitoneal air, consistent with bowel perforation, and multiple abscesses in lower abdomen and pelvis. Diffuse wall thickening involving distal small bowel loops and colon, consistent with enterocolitis. Left-sided colonic diverticulosis is noted, however small and large bowel wall thickening is much more extensive. Differential diagnosis includes bowel ischemia, infectious or inflammatory enterocolitis, and perforated diverticulitis with secondary involvement of small bowel. Tiny bilateral pleural effusions and small hiatal hernia. Critical Value/emergent results were called by telephone at the time of  interpretation on 05/16/2015 at 6:26 pm to patient's floor nurse April, who verbally acknowledged these results. Electronically Signed   By: Earle Gell M.D.   On: 05/16/2015 18:31    EKG:   Orders placed or performed during the hospital encounter of 05/15/15  . EKG 12-Lead  . EKG 12-Lead    ASSESSMENT AND PLAN:   64. 81 year old the female patient admitted for sepsis; due to UTI and intra-abdominal sepsis and lactic acidosis;   #2 abdominal pain; due to bowel perforation with multiple diverticula are abscesses: Status post emergency ex-lap ; continue nothing by mouth, IV hydration,  surgery following. For colitis patient already on Zosyn and Flagyl  Had one BM today. Likely can be started on clear liquids today.  Caledl the surgery to see if they can transfer the patient to their  service. 3. DVT prophylaxis. Physical therapy recommends SNF placement.  All the records are reviewed and case discussed with Care Management/Social Workerr. Management plans discussed with the patient, family and they are in agreement.  CODE STATUS: DNR  TOTAL TIME TAKING CARE OF THIS PATIENT: 35 minutes.   POSSIBLE D/C IN  DAYS, DEPENDING ON CLINICAL CONDITION.   Epifanio Lesches M.D on 05/18/2015 at 1:19 PM  Between 7am to 6pm - Pager - [phone]  After 6pm go to [email or web address] - password [email or web address] [facility]  Office  [phone]  CC: Primary care physician; Adin Hector, MD   Note: This dictation was prepared with Dragon dictation along with smaller phrase technology. Any transcriptional errors that result from this process are unintentional.

## 2015-05-18 NOTE — Progress Notes (Signed)
Spoke with Dr. Vianne Bulls okay to place order for zofran 4mg  q6 hours prn for nausea.

## 2015-05-18 NOTE — Progress Notes (Signed)
Per Dr. Adonis Huguenin okay to saline lock IV, place patient on clear liquid diet, and discontinue foley catheter. New orders received and placed by RN.

## 2015-05-19 LAB — CBC WITH DIFFERENTIAL/PLATELET
Basophils Absolute: 0 10*3/uL (ref 0–0.1)
Basophils Relative: 0 %
EOS PCT: 0 %
Eosinophils Absolute: 0.1 10*3/uL (ref 0–0.7)
HCT: 33.2 % — ABNORMAL LOW (ref 35.0–47.0)
Hemoglobin: 11 g/dL — ABNORMAL LOW (ref 12.0–16.0)
LYMPHS ABS: 0.9 10*3/uL — AB (ref 1.0–3.6)
LYMPHS PCT: 6 %
MCH: 31.3 pg (ref 26.0–34.0)
MCHC: 33.2 g/dL (ref 32.0–36.0)
MCV: 94.1 fL (ref 80.0–100.0)
MONO ABS: 0.4 10*3/uL (ref 0.2–0.9)
Monocytes Relative: 3 %
Neutro Abs: 14.9 10*3/uL — ABNORMAL HIGH (ref 1.4–6.5)
Neutrophils Relative %: 91 %
PLATELETS: 263 10*3/uL (ref 150–440)
RBC: 3.53 MIL/uL — AB (ref 3.80–5.20)
RDW: 14.6 % — ABNORMAL HIGH (ref 11.5–14.5)
WBC: 16.4 10*3/uL — AB (ref 3.6–11.0)

## 2015-05-19 LAB — SURGICAL PATHOLOGY

## 2015-05-19 LAB — CREATININE, SERUM
Creatinine, Ser: 0.63 mg/dL (ref 0.44–1.00)
GFR calc Af Amer: >60 mL/min (ref >60)
GFR calc non Af Amer: >60 mL/min (ref >60)

## 2015-05-19 MED ORDER — HYDROCODONE-ACETAMINOPHEN 5-325 MG PO TABS
1.0000 | ORAL_TABLET | Freq: Four times a day (QID) | ORAL | Status: DC | PRN
  Administered 2015-05-22 – 2015-05-23 (×3): 1 via ORAL
  Filled 2015-05-19 (×3): qty 1

## 2015-05-19 MED ORDER — AMOXICILLIN-POT CLAVULANATE 875-125 MG PO TABS
1.0000 | ORAL_TABLET | Freq: Two times a day (BID) | ORAL | Status: DC
  Administered 2015-05-19 (×2): 1 via ORAL
  Filled 2015-05-19 (×3): qty 1

## 2015-05-19 MED ORDER — METRONIDAZOLE 500 MG PO TABS
500.0000 mg | ORAL_TABLET | Freq: Three times a day (TID) | ORAL | Status: DC
  Administered 2015-05-19 (×2): 500 mg via ORAL
  Filled 2015-05-19 (×3): qty 1

## 2015-05-19 NOTE — Progress Notes (Signed)
[facility] [place] NAME: Shelly Ponce    MR#:  [id number]  DATE OF BIRTH:  [birth year]  SUBJECTIVE: Status post surgery for perforated bowel. Postop day 2. NG tube is out. No fever. Patient doing well. No abdominal pain. No nausea, no vomiting.  Patient appears to be improving. No fever tolerating the diet.   CHIEF COMPLAINT:   Chief Complaint  Patient presents with  . Fever  . Diarrhea    REVIEW OF SYSTEMS:   Review of Systems  Constitutional: Negative for fever and chills.  HENT: Negative for hearing loss.   Eyes: Negative for blurred vision, double vision and photophobia.  Respiratory: Negative for cough, hemoptysis and shortness of breath.   Cardiovascular: Negative for palpitations, orthopnea and leg swelling.  Gastrointestinal: Negative for nausea, vomiting, abdominal pain and diarrhea.  Genitourinary: Negative for dysuria and urgency.  Musculoskeletal: Negative for myalgias and neck pain.  Skin: Negative for rash.  Neurological: Negative for dizziness, focal weakness, seizures, weakness and headaches.  Psychiatric/Behavioral: Negative for memory loss. The patient does not have insomnia.      DRUG ALLERGIES:   Allergies  Allergen Reactions  . Macrobid [Nitrofurantoin] Rash  . Sulfa Antibiotics Rash    VITALS:  Blood pressure 113/63, pulse 73, temperature 98.2 F (36.8 C), temperature source Oral, resp. rate 16, height 5\' 2"  (1.575 m), weight 42.593 kg (93 lb 14.4 oz), SpO2 97 %.  PHYSICAL EXAMINATION:  GENERAL:  81 y.o.-year-old patient lying in the bed with no acute distress.  EYES: Pupils equal, round, reactive to light and accommodation. No scleral icterus. Extraocular muscles intact.  HEENT: Head atraumatic, normocephalic. Oropharynx and nasopharynx clear. NG tube in place. NECK:  Supple, no jugular venous distention. No thyroid enlargement, no tenderness.  LUNGS: Normal breath sounds bilaterally, no  wheezing, rales,rhonchi or crepitation. No use of accessory muscles of respiration.  CARDIOVASCULAR: S1, S2 normal. No murmurs, rubs, or gallops.  ABDOMEN: s/p surgery/operated site looks clean,. EXTREMITIES: No pedal edema, cyanosis, or clubbing.  NEUROLOGIC: Cranial nerves II through XII are intact. Muscle strength 5/5 in all extremities. Sensation intact. Gait not checked.  PSYCHIATRIC: The patient is alert and oriented x 3.  SKIN: No obvious rash, lesion, or ulcer.    LABORATORY PANEL:   CBC  Recent Labs Lab 05/19/15 0449  WBC 16.4*  HGB 11.0*  HCT 33.2*  PLT 263   ------------------------------------------------------------------------------------------------------------------  Chemistries   Recent Labs Lab 05/15/15 1500 05/16/15 0445 05/19/15 0449  NA 141 141  --   K 3.3* 3.9  --   CL 109 111  --   CO2 24 23  --   GLUCOSE 109* 97  --   BUN 21* 15  --   CREATININE 0.73 0.64 0.63  CALCIUM 7.8* 7.9*  --   AST 30  --   --   ALT 14  --   --   ALKPHOS 88  --   --   BILITOT 0.6  --   --    ------------------------------------------------------------------------------------------------------------------  Cardiac Enzymes No results for input(s): TROPONINI in the last 168 hours. ------------------------------------------------------------------------------------------------------------------  RADIOLOGY:  No results found.  EKG:   Orders placed or performed during the hospital encounter of 05/15/15  . EKG 12-Lead  . EKG 12-Lead    ASSESSMENT AND PLAN:   38. 81 year old the female patient admitted for sepsis; due to UTI and intra-abdominal sepsis and lactic acidosis;   #2 abdominal pain; due to bowel perforation  with multiple diverticula are abscesses: Status post emergency ex-lap ; continue nothing by mouth, IV hydration,  surgery following. For colitis patient already on Zosyn and Flagyl, tolerating the diet. 3. DVT prophylaxis. Physical therapy recommends  SNF placement. #4 history of depression and is on trazodone 50 mg at bedtime as needed. Arthritis and she is on Mobic 7.5 mg twice a day. These medications can be started at discharge.  Appreciate surgery taking over the patient. We will follow along.  All the records are reviewed and case discussed with Care Management/Social Workerr. Management plans discussed with the patient, family and they are in agreement.  CODE STATUS: DNR  TOTAL TIME TAKING CARE OF THIS PATIENT: 35 minutes.   POSSIBLE D/C IN  DAYS, DEPENDING ON CLINICAL CONDITION.   Epifanio Lesches M.D on 05/19/2015 at 9:22 AM  Between 7am to 6pm - Pager - [phone]  After 6pm go to [email or web address] - password [email or web address] [facility]  Office  [phone]  CC: Primary care physician; Adin Hector, MD   Note: This dictation was prepared with Dragon dictation along with smaller phrase technology. Any transcriptional errors that result from this process are unintentional.

## 2015-05-19 NOTE — Clinical Social Work Note (Signed)
Clinical Social Work Assessment  Patient Details  Name: Shelly Ponce MRN: [id number] Date of Birth: 02-10-1933  Date of referral:  05/19/15               Reason for consult:  Facility Placement                Permission sought to share information with:  Chartered certified accountant granted to share information::  Yes, Verbal Permission Granted  Name::        Agency::     Relationship::     Contact Information:     Housing/Transportation Living arrangements for the past 2 months:  Single Family Home Source of Information:  Patient Patient Interpreter Needed:  None Criminal Activity/Legal Involvement Pertinent to Current Situation/Hospitalization:  No - Comment as needed Significant Relationships:  None Lives with:  Self Do you feel safe going back to the place where you live?  Yes Need for family participation in patient care:  No (Coment)  Care giving concerns:  Patient resides alone.   Social Worker assessment / plan:  CSW informed by PT that they recommend STR for patient. CSW met with patient this afternoon and she is in agreement with STR. Patient prefers [facility] if possible. Patient states she lives alone and does not have family nearby. She spoke of a sister in law who had flowers delivered to her hospital room. CSW has initiated bedsearch.   Employment status:  Retired Nurse, adult PT Recommendations:  [place] / Referral to community resources:  [place]  Patient/Family's Response to care:  Patient willing and open to consider rehab as she lives alone.   Patient/Family's Understanding of and Emotional Response to Diagnosis, Current Treatment, and Prognosis:   Patient verbalized appreciation for CSW assistance.  Emotional Assessment Appearance:  Appears stated age Attitude/Demeanor/Rapport:   (pleasant and cooperative) Affect (typically observed):  Accepting, Adaptable,  Calm Orientation:  Oriented to Self, Oriented to Place, Oriented to  Time, Oriented to Situation Alcohol / Substance use:  Not Applicable Psych involvement (Current and /or in the community):  No (Comment)  Discharge Needs  Concerns to be addressed:  No discharge needs identified Readmission within the last 30 days:  No Current discharge risk:  None Barriers to Discharge:  No Barriers Identified   Shela Leff, LCSW 05/19/2015, 2:38 PM

## 2015-05-19 NOTE — NC FL2 (Signed)
Shelly Ponce LEVEL OF CARE SCREENING TOOL     IDENTIFICATION  Patient Name: Shelly Ponce Birthdate: [birth year] Sex: female Admission Date (Current Location): 05/15/2015  [facility] and [place] Number: Engineering geologist and Address:  [facility], [address], [place], [place] [address]      Provider Number: [id number]  Attending Physician Name and Address:  Epifanio Lesches, MD  Relative Name and Phone Number:       Current Level of Care: Hospital Recommended Level of Care: [place] Prior Approval Number:    Date Approved/Denied:   PASRR Number:    Discharge Plan: SNF    Current Diagnoses: Patient Active Problem List   Diagnosis Date Noted  . Abdominal pain   . Sepsis ([place]) 05/15/2015  . UTI (lower urinary tract infection) 05/15/2015    Orientation ACTIVITIES/SOCIAL BLADDER RESPIRATION    Self, Time, Situation, Place  Active Continent Normal  BEHAVIORAL SYMPTOMS/MOOD NEUROLOGICAL BOWEL NUTRITION STATUS   (none)  (none) Continent Diet  PHYSICIAN VISITS COMMUNICATION OF NEEDS Height & Weight Skin  30 days Verbally   93 lbs. Normal          AMBULATORY STATUS RESPIRATION    Supervision limited Normal      Personal Care Assistance Level of Assistance  Bathing, Dressing Bathing Assistance: Independent   Dressing Assistance: Independent      Functional Limitations Info  Sight Sight Info: Impaired           SPECIAL CARE FACTORS FREQUENCY  PT (By licensed PT)                   Additional Factors Info  Code Status Code Status Info: DNR             Current Medications (05/19/2015):  This is the current hospital active medication list Current Facility-Administered Medications  Medication Dose Route Frequency Provider Last Rate Last Dose  . acetaminophen (TYLENOL) tablet 1,000 mg  1,000 mg Oral Q6H Hubbard Robinson, MD   1,000 mg at 05/19/15 1300  . amoxicillin-clavulanate  (AUGMENTIN) 875-125 MG per tablet 1 tablet  1 tablet Oral Q12H Clayburn Pert, MD   1 tablet at 05/19/15 1259  . diclofenac sodium (VOLTAREN) 1 % transdermal gel 2 g  2 g Topical QID PRN [name], MD      . diphenhydrAMINE (BENADRYL) capsule 50 mg  50 mg Oral QHS PRN Lance Coon, MD   50 mg at 05/18/15 2236  . heparin injection 5,000 Units  5,000 Units Subcutaneous 3 times per day [name], MD   5,000 Units at 05/19/15 1300  . HYDROcodone-acetaminophen (NORCO/VICODIN) 5-325 MG per tablet 1 tablet  1 tablet Oral Q6H PRN Clayburn Pert, MD      . metroNIDAZOLE (FLAGYL) tablet 500 mg  500 mg Oral 3 times per day Clayburn Pert, MD   500 mg at 05/19/15 1308  . morphine 2 MG/ML injection 2 mg  2 mg Intravenous Q3H PRN Hubbard Robinson, MD   2 mg at 05/19/15 0410  . ondansetron (ZOFRAN) injection 4 mg  4 mg Intravenous Once PRN Gunnar Bulla, MD   4 mg at 05/18/15 0956  . ondansetron (ZOFRAN) injection 4 mg  4 mg Intravenous Q6H PRN Epifanio Lesches, MD   4 mg at 05/19/15 [id number]     Discharge Medications: Please see discharge summary for a list of discharge medications.  Relevant Imaging Results:  Relevant Lab Results:  Recent Labs  Additional Information SS: [id number]  Shela Leff, LCSW

## 2015-05-19 NOTE — Progress Notes (Signed)
Physical Therapy Treatment Patient Details Name: Shelly Ponce MRN: [id number] DOB: 10-17-1932 Today's Date: 05/19/2015    History of Present Illness presented to ER with abdominal pain, fever; admitted with sepsis related to UTI and perforated bowel with abscesses.  Status post ex-lap with appendectomy, I and D of multiple abdominal and pelvic abscesses 11/26.    PT Comments    Patient able to initiate very short-distance gait, but significantly limited by fatigue.  Very poor activity tolerance noted, requiring increased encouragement, increased time and rest periods to complete all activities.   Follow Up Recommendations  SNF     Equipment Recommendations       Recommendations for Other Services       Precautions / Restrictions Precautions Precautions: Fall Restrictions Weight Bearing Restrictions: No    Mobility  Bed Mobility Overal bed mobility: Needs Assistance Bed Mobility: Sit to Supine       Sit to supine: Min assist   General bed mobility comments: assist for LE elevation over edge of bed  Transfers Overall transfer level: Needs assistance Equipment used: Rolling walker (2 wheeled) Transfers: Sit to/from [facility] Sit to Stand: Min assist Stand pivot transfers: Min assist          Ambulation/Gait Ambulation/Gait assistance: Min assist Ambulation Distance (Feet): 10 Feet Assistive device: Rolling walker (2 wheeled)     Gait velocity interpretation: <1.8 ft/sec, indicative of risk for recurrent falls General Gait Details: step to gait pattern, antalgic L LE with decreased stance time L LE (baseline per patient).  Slow and deliberate.  Refused distance beyond 10' due to fatigue.   Stairs            Wheelchair Mobility    Modified Rankin (Stroke Patients Only)       Balance Overall balance assessment: Needs assistance Sitting-balance support: No upper extremity supported;Feet supported Sitting balance-Leahy Scale:  Good     Standing balance support: Bilateral upper extremity supported Standing balance-Leahy Scale: Fair                      Cognition Arousal/Alertness: Awake/alert Behavior During Therapy: WFL for tasks assessed/performed Overall Cognitive Status: Within Functional Limits for tasks assessed                      Exercises Other Exercises Other Exercises: Toilet transfer, SPT with HHA, min assist; sit/stand from [facility], min assist. Other Exercises: Supine LE therex, 1x10, AROM: ankle pumps, SAQs, heel slides.  Patient deferred additional activity due to fatigue -"that's enough for today"    General Comments        Pertinent Vitals/Pain Pain Assessment: 0-10 Pain Score: 5  Pain Location: abdomen Pain Descriptors / Indicators: Aching Pain Intervention(s): Limited activity within patient's tolerance;Monitored during session;Repositioned    Home Living                      Prior Function            PT Goals (current goals can now be found in the care plan section) Acute Rehab PT Goals Patient Stated Goal: "to move around a little" PT Goal Formulation: With patient Time For Goal Achievement: 06/01/15 Potential to Achieve Goals: Good Progress towards PT goals: Progressing toward goals    Frequency  Min 2X/week    PT Plan Current plan remains appropriate    Co-evaluation             End of  Session Equipment Utilized During Treatment: Gait belt Activity Tolerance: Patient limited by fatigue Patient left: in bed;with call bell/phone within reach;with bed alarm set     Time: [id number] PT Time Calculation (min) (ACUTE ONLY): 14 min  Charges:  $Therapeutic Activity: 8-22 mins                    G Codes:       Tishia Maestre H. Owens Shark, PT, DPT, NCS 05/19/2015, 3:38 PM [phone]

## 2015-05-19 NOTE — Progress Notes (Signed)
3 Days Post-Op   Subjective:  81 year old female postop day #3 status post export her laparotomy. Patient has been tolerating her clear liquid diet and she denies any nausea or vomiting today. She continues to pass flatus and had a bowel movement yesterday. She states her pain is currently controlled with her indication.  Vital signs in last 24 hours: Temp:  [98.2 F (36.8 C)-98.4 F (36.9 C)] 98.2 F (36.8 C) (11/29 0427) Pulse Rate:  [71-73] 73 (11/29 0427) Resp:  [14-16] 16 (11/29 0427) BP: (113-132)/(63) 113/63 mmHg (11/29 0427) SpO2:  [95 %-99 %] 97 % (11/29 0427) Last BM Date: 05/16/15  Intake/Output from previous day: 11/28 0701 - 11/29 0700 In: 922 [I.V.:871; IV Piggyback:51] Out: 550 [Urine:550]  Physical exam: Gen.: Resting in bed in no acute distress Chest: Clear to auscultation Heart: Regular rate and rhythm GI: Abdomen is soft, nondistended, well approximated midline over a Penrose drain. No evidence of erythema or purulent drainage. Bowel sounds present.  Lab Results:  CBC  Recent Labs  05/19/15 0449  WBC 16.4*  HGB 11.0*  HCT 33.2*  PLT 263   CMP     Component Value Date/Time   NA 141 05/16/2015 0445   NA 141 08/13/2013 1330   K 3.9 05/16/2015 0445   K 4.0 08/13/2013 1330   CL 111 05/16/2015 0445   CL 108* 08/13/2013 1330   CO2 23 05/16/2015 0445   CO2 29 08/13/2013 1330   GLUCOSE 97 05/16/2015 0445   GLUCOSE 75 08/13/2013 1330   BUN 15 05/16/2015 0445   BUN 19* 08/13/2013 1330   CREATININE 0.63 05/19/2015 0449   CREATININE 0.75 08/13/2013 1330   CALCIUM 7.9* 05/16/2015 0445   CALCIUM 9.0 08/13/2013 1330   PROT 5.2* 05/15/2015 1500   ALBUMIN 2.4* 05/15/2015 1500   AST 30 05/15/2015 1500   ALT 14 05/15/2015 1500   ALKPHOS 88 05/15/2015 1500   BILITOT 0.6 05/15/2015 1500   GFRNONAA >60 05/19/2015 0449   GFRNONAA >60 08/13/2013 1330   GFRAA >60 05/19/2015 0449   GFRAA >60 08/13/2013 1330   PT/INR No results for input(s): LABPROT, INR in  the last 72 hours.  Studies/Results: No results found.  Assessment/Plan: 81 year old female postop day #3 status post export her laparotomy for multiple interloop abscesses. She has tolerated her liquid diet. She states she is not interested in advancing her diet yet. Plan to transition to oral antibiotics today. Encourage ambulation with physical therapy. Encourage incentive spirometer usage. If she continues to progress she will be ready for skilled nursing facility later this week.  Clayburn Pert, MD FACS General Surgeon [facility]

## 2015-05-20 LAB — CBC
HCT: 35.4 % (ref 35.0–47.0)
Hemoglobin: 11.5 g/dL — ABNORMAL LOW (ref 12.0–16.0)
MCH: 30.6 pg (ref 26.0–34.0)
MCHC: 32.4 g/dL (ref 32.0–36.0)
MCV: 94.6 fL (ref 80.0–100.0)
PLATELETS: 306 10*3/uL (ref 150–440)
RBC: 3.74 MIL/uL — ABNORMAL LOW (ref 3.80–5.20)
RDW: 14.6 % — AB (ref 11.5–14.5)
WBC: 19.2 10*3/uL — ABNORMAL HIGH (ref 3.6–11.0)

## 2015-05-20 LAB — WOUND CULTURE

## 2015-05-20 LAB — BASIC METABOLIC PANEL
Anion gap: 10 (ref 5–15)
BUN: 15 mg/dL (ref 6–20)
CALCIUM: 7.9 mg/dL — AB (ref 8.9–10.3)
CO2: 19 mmol/L — ABNORMAL LOW (ref 22–32)
CREATININE: 0.65 mg/dL (ref 0.44–1.00)
Chloride: 110 mmol/L (ref 101–111)
GFR calc Af Amer: >60 mL/min (ref >60)
GLUCOSE: 69 mg/dL (ref 65–99)
POTASSIUM: 3.7 mmol/L (ref 3.5–5.1)
Sodium: 139 mmol/L (ref 135–145)

## 2015-05-20 LAB — CULTURE, BLOOD (ROUTINE X 2)
CULTURE: NO GROWTH
Culture: NO GROWTH

## 2015-05-20 LAB — PHOSPHORUS: Phosphorus: 3 mg/dL (ref 2.5–4.6)

## 2015-05-20 LAB — MAGNESIUM: Magnesium: 1.8 mg/dL (ref 1.7–2.4)

## 2015-05-20 MED ORDER — PIPERACILLIN-TAZOBACTAM 3.375 G IVPB
3.3750 g | Freq: Three times a day (TID) | INTRAVENOUS | Status: DC
  Administered 2015-05-20 – 2015-05-22 (×6): 3.375 g via INTRAVENOUS
  Filled 2015-05-20 (×8): qty 50

## 2015-05-20 MED ORDER — PIPERACILLIN-TAZOBACTAM 3.375 G IVPB
3.3750 g | Freq: Four times a day (QID) | INTRAVENOUS | Status: DC
Start: 2015-05-20 — End: 2015-05-20
  Filled 2015-05-20 (×3): qty 50

## 2015-05-20 MED ORDER — LACTATED RINGERS IV SOLN
INTRAVENOUS | Status: DC
  Administered 2015-05-20: 20:00:00 via INTRAVENOUS
  Administered 2015-05-20: 1000 mL via INTRAVENOUS
  Administered 2015-05-21 – 2015-05-23 (×5): via INTRAVENOUS

## 2015-05-20 NOTE — Progress Notes (Signed)
PT Cancellation Note  Patient Details Name: Shelly Ponce MRN: [id number] DOB: [birth year]   Cancelled Treatment:    Reason Eval/Treat Not Completed: Patient declined, no reason specified (Treatment session attempted.  Patient declined at this time due to nausea/fatigue.  RN informed/aware.  Will re-attempt at later time/date as available and medically appropriate.)  Negin Hegg H. Owens Shark, PT, DPT, NCS 05/20/2015, 11:08 AM [phone]

## 2015-05-20 NOTE — Progress Notes (Signed)
ANTIBIOTIC CONSULT NOTE - INITIAL  Pharmacy Consult for piperacillin/tazobactam Indication: Intra-abdominal infection  Allergies  Allergen Reactions  . Macrobid [Nitrofurantoin] Rash  . Sulfa Antibiotics Rash    Patient Measurements: Height: 5\' 2"  (157.5 cm) Weight: 93 lb 14.4 oz (42.593 kg) IBW/kg (Calculated) : 50.1  Vital Signs: Temp: 98.2 F (36.8 C) (11/30 0410) Temp Source: Oral (11/30 0410) BP: 125/69 mmHg (11/30 0410) Pulse Rate: 85 (11/30 0410) Intake/Output from previous day: 11/29 0701 - 11/30 0700 In: 0  Out: 500 [Urine:500] Intake/Output from this shift: Total I/O In: -  Out: 50 [Urine:50]  Labs:  Recent Labs  05/19/15 0449 05/20/15 0532  WBC 16.4* 19.2*  HGB 11.0* 11.5*  PLT 263 306  CREATININE 0.63 0.65   Estimated Creatinine Clearance: 37.7 mL/min (by C-G formula based on Cr of 0.65). No results for input(s): VANCOTROUGH, VANCOPEAK, VANCORANDOM, GENTTROUGH, GENTPEAK, GENTRANDOM, TOBRATROUGH, TOBRAPEAK, TOBRARND, AMIKACINPEAK, AMIKACINTROU, AMIKACIN in the last 72 hours.   Microbiology: Recent Results (from the past 720 hour(s))  Blood Culture (routine x 2)     Status: None   Collection Time: 05/15/15  1:18 PM  Result Value Ref Range Status   Specimen Description BLOOD RIGHT ASSIST CONTROL  Final   Special Requests BOTTLES DRAWN AEROBIC AND ANAEROBIC  1CC  Final   Culture NO GROWTH 5 DAYS  Final   Report Status 05/20/2015 FINAL  Final  Blood Culture (routine x 2)     Status: None   Collection Time: 05/15/15  1:18 PM  Result Value Ref Range Status   Specimen Description BLOOD LEFT ASSIST CONTROL  Final   Special Requests BOTTLES DRAWN AEROBIC AND ANAEROBIC  1CC  Final   Culture NO GROWTH 5 DAYS  Final   Report Status 05/20/2015 FINAL  Final  Urine culture     Status: None   Collection Time: 05/15/15  4:41 PM  Result Value Ref Range Status   Specimen Description URINE, RANDOM  Final   Special Requests NONE  Final   Culture NO GROWTH 2  DAYS  Final   Report Status 05/17/2015 FINAL  Final  Anaerobic culture     Status: None (Preliminary result)   Collection Time: 05/16/15 12:54 PM  Result Value Ref Range Status   Specimen Description PERITONEAL  Final   Special Requests NONE  Final   Culture NO ANAEROBES ISOLATED  Final   Report Status PENDING  Incomplete  Wound culture     Status: None   Collection Time: 05/16/15 12:54 PM  Result Value Ref Range Status   Specimen Description PERITONEAL  Final   Special Requests NONE  Final   Gram Stain FEW WBC SEEN NO ORGANISMS SEEN   Final   Culture RARE ESCHERICHIA COLI  Final   Report Status 05/20/2015 FINAL  Final   Organism ID, Bacteria ESCHERICHIA COLI  Final      Susceptibility   Escherichia coli - MIC*    AMPICILLIN >=32 RESISTANT Resistant     CEFAZOLIN <=4 SENSITIVE Sensitive     CEFTRIAXONE <=1 SENSITIVE Sensitive     CIPROFLOXACIN 1 SENSITIVE Sensitive     GENTAMICIN <=1 SENSITIVE Sensitive     IMIPENEM <=0.25 SENSITIVE Sensitive     NITROFURANTOIN <=16 SENSITIVE Sensitive     TRIMETH/SULFA >=320 RESISTANT Resistant     PIP/TAZO Value in next row Sensitive      SENSITIVE<=4    * RARE ESCHERICHIA COLI    Medical History: Past Medical History  Diagnosis Date  . Scoliosis   .  Arthritis     Medications:  Anti-infectives    Start     Dose/Rate Route Frequency Ordered Stop   05/20/15 1200  piperacillin-tazobactam (ZOSYN) IVPB 3.375 g  Status:  Discontinued     3.375 g 12.5 mL/hr over 240 Minutes Intravenous 4 times per day 05/20/15 1056 05/20/15 1101   05/20/15 1130  piperacillin-tazobactam (ZOSYN) IVPB 3.375 g     3.375 g 12.5 mL/hr over 240 Minutes Intravenous 3 times per day 05/20/15 1101     05/19/15 1400  metroNIDAZOLE (FLAGYL) tablet 500 mg  Status:  Discontinued     500 mg Oral 3 times per day 05/19/15 1012 05/20/15 1055   05/19/15 1015  amoxicillin-clavulanate (AUGMENTIN) 875-125 MG per tablet 1 tablet  Status:  Discontinued     1 tablet Oral Every  12 hours 05/19/15 1012 05/20/15 1055   05/17/15 1100  metroNIDAZOLE (FLAGYL) IVPB 500 mg  Status:  Discontinued     500 mg 100 mL/hr over 60 Minutes Intravenous Every 8 hours 05/17/15 1045 05/18/15 1415   05/17/15 0600  piperacillin-tazobactam (ZOSYN) IVPB 3.375 g  Status:  Discontinued     3.375 g 12.5 mL/hr over 240 Minutes Intravenous 3 times per day 05/16/15 2213 05/19/15 1012   05/16/15 2215  piperacillin-tazobactam (ZOSYN) IVPB 3.375 g     3.375 g 100 mL/hr over 30 Minutes Intravenous  Once 05/16/15 2208 05/16/15 2251   05/16/15 0715  vancomycin (VANCOCIN) IVPB 750 mg/150 ml premix  Status:  Discontinued     750 mg 150 mL/hr over 60 Minutes Intravenous Every 24 hours 05/15/15 1721 05/15/15 1902   05/15/15 2100  piperacillin-tazobactam (ZOSYN) IVPB 3.375 g  Status:  Discontinued     3.375 g 12.5 mL/hr over 240 Minutes Intravenous 3 times per day 05/15/15 1724 05/15/15 1902   05/15/15 1730  cefTRIAXone (ROCEPHIN) 1 g in dextrose 5 % 50 mL IVPB  Status:  Discontinued     1 g 100 mL/hr over 30 Minutes Intravenous Every 24 hours 05/15/15 1728 05/16/15 2257   05/15/15 1315  piperacillin-tazobactam (ZOSYN) IVPB 3.375 g     3.375 g 100 mL/hr over 30 Minutes Intravenous  Once 05/15/15 1310 05/15/15 1418   05/15/15 1315  vancomycin (VANCOCIN) IVPB 1000 mg/200 mL premix     1,000 mg 200 mL/hr over 60 Minutes Intravenous  Once 05/15/15 1310 05/15/15 1514     Assessment: Pharmacy consulted to dose piperacillin/tazobactam in this 81 year old female for an intra-abdominal infection. Patient was admitted and underwent ex-lap on 11/26 for abscess and small bowel perforation. She is currently post-po day #4 and has been receiving piperacillin/tazobactam. Antibiotics were changed to PO amoxicillin/clavulanic acid yesterday but patient has nausea/vomiting overnight so antibiotics being switched back to IV today. Patient currently afebrile.  Currently post-op day #4 antibiotics CrCl ~ 37  mL/min  Plan:  Restart piperacillin/tazobactam 3.375 g IV q8h EI per indication and renal function Pharmacy will continue to monitor, thank you for the consult.  Shelly Ponce Shelly Ponce 05/20/2015,11:41 AM

## 2015-05-20 NOTE — Care Management Important Message (Signed)
Important Message  Patient Details  Name: Shelly Ponce MRN: [id number] Date of Birth: Sep 22, 1932   Medicare Important Message Given:  Yes    Juliann Pulse A Aleiya Rye 05/20/2015, 10:12 AM

## 2015-05-20 NOTE — Progress Notes (Signed)
4 Days Post-Op   Subjective:  Patient had recurrence of nausea and vomiting overnight. She states her appetite is gone that she is not hungry. Her only complaints of pain or from her mid abdomen. She is unsure she's been passing flatus.  Vital signs in last 24 hours: Temp:  [98.2 F (36.8 C)-98.6 F (37 C)] 98.2 F (36.8 C) (11/30 0410) Pulse Rate:  [85-96] 85 (11/30 0410) Resp:  [17-18] 17 (11/30 0410) BP: (125-139)/(66-73) 125/69 mmHg (11/30 0410) SpO2:  [96 %-99 %] 97 % (11/30 0410) Last BM Date: 05/16/15  Intake/Output from previous day: 11/29 0701 - 11/30 0700 In: 0  Out: 500 [Urine:500]  Physical exam: Gen.: Resting in bed in no acute distress Chest: Clear to auscultation Heart: Regular rate and rhythm GI: Soft, mildly distended, appropriately tender to palpation along her midline incision, no spreading erythema or cellulitis with minimal serous drainage from Penrose drains.  Lab Results:  CBC  Recent Labs  05/19/15 0449 05/20/15 0532  WBC 16.4* 19.2*  HGB 11.0* 11.5*  HCT 33.2* 35.4  PLT 263 306   CMP     Component Value Date/Time   NA 139 05/20/2015 0532   NA 141 08/13/2013 1330   K 3.7 05/20/2015 0532   K 4.0 08/13/2013 1330   CL 110 05/20/2015 0532   CL 108* 08/13/2013 1330   CO2 19* 05/20/2015 0532   CO2 29 08/13/2013 1330   GLUCOSE 69 05/20/2015 0532   GLUCOSE 75 08/13/2013 1330   BUN 15 05/20/2015 0532   BUN 19* 08/13/2013 1330   CREATININE 0.65 05/20/2015 0532   CREATININE 0.75 08/13/2013 1330   CALCIUM 7.9* 05/20/2015 0532   CALCIUM 9.0 08/13/2013 1330   PROT 5.2* 05/15/2015 1500   ALBUMIN 2.4* 05/15/2015 1500   AST 30 05/15/2015 1500   ALT 14 05/15/2015 1500   ALKPHOS 88 05/15/2015 1500   BILITOT 0.6 05/15/2015 1500   GFRNONAA >60 05/20/2015 0532   GFRNONAA >60 08/13/2013 1330   GFRAA >60 05/20/2015 0532   GFRAA >60 08/13/2013 1330   PT/INR No results for input(s): LABPROT, INR in the last 72 hours.  Studies/Results: No results  found.  Assessment/Plan: 81 year old female 4 days status post exploratory laparotomy. Appears to not have the GI function previously thought she had. Advancement in diet and transition to oral antibiotics performed before she was fully ready. We'll restart IV fluids and restart IV antibiotics. Encourage ambulation and incentive spirometer usage.  Clayburn Pert, MD FACS General Surgeon [facility]

## 2015-05-20 NOTE — Clinical Social Work Note (Signed)
[facility] informed CSW that they can take patient and this is patient's preference. CSW will facilitate discharge to [facility] when time.  Shela Leff MSW,LCSW [phone]

## 2015-05-21 LAB — BASIC METABOLIC PANEL
ANION GAP: 9 (ref 5–15)
BUN: 11 mg/dL (ref 6–20)
CALCIUM: 8 mg/dL — AB (ref 8.9–10.3)
CO2: 23 mmol/L (ref 22–32)
Chloride: 108 mmol/L (ref 101–111)
Creatinine, Ser: 0.71 mg/dL (ref 0.44–1.00)
Glucose, Bld: 83 mg/dL (ref 65–99)
POTASSIUM: 3.7 mmol/L (ref 3.5–5.1)
Sodium: 140 mmol/L (ref 135–145)

## 2015-05-21 LAB — CBC
HEMATOCRIT: 33.5 % — AB (ref 35.0–47.0)
HEMOGLOBIN: 11.3 g/dL — AB (ref 12.0–16.0)
MCH: 32 pg (ref 26.0–34.0)
MCHC: 33.6 g/dL (ref 32.0–36.0)
MCV: 95.1 fL (ref 80.0–100.0)
Platelets: 322 10*3/uL (ref 150–440)
RBC: 3.52 MIL/uL — AB (ref 3.80–5.20)
RDW: 15.2 % — AB (ref 11.5–14.5)
WBC: 15.9 10*3/uL — AB (ref 3.6–11.0)

## 2015-05-21 LAB — ANAEROBIC CULTURE

## 2015-05-21 MED ORDER — ENSURE ENLIVE PO LIQD
237.0000 mL | Freq: Two times a day (BID) | ORAL | Status: DC
  Administered 2015-05-21 – 2015-05-23 (×4): 237 mL via ORAL

## 2015-05-21 NOTE — Progress Notes (Signed)
Initial Nutrition Assessment     INTERVENTION:  Meals and snacks: monitor intake and await diet progression Medical Nutrition Supplement Therapy: Recommend ensure BID for added nutrition   NUTRITION DIAGNOSIS:   Inadequate oral intake related to altered GI function as evidenced by meal completion < 50%.    GOAL:   Patient will meet greater than or equal to 90% of their needs    MONITOR:    (Energy intake, Digestive system, )  REASON FOR ASSESSMENT:   LOS    ASSESSMENT:      Pt s/p exploratory lap finding multiple interloop abscesses  Past Medical History  Diagnosis Date  . Scoliosis   . Arthritis     Current Nutrition: small intake of full liquids   Food/Nutrition-Related History: unable to determine intake PTA as pt needing to use restroom on visit entry   Scheduled Medications:  . acetaminophen  1,000 mg Oral Q6H  . heparin  5,000 Units Subcutaneous 3 times per day  . piperacillin-tazobactam (ZOSYN)  IV  3.375 g Intravenous 3 times per day    Continuous Medications:  . lactated ringers 125 mL/hr at 05/21/15 1029     Electrolyte/Renal Profile and Glucose Profile:   Recent Labs Lab 05/16/15 0445 05/19/15 0449 05/20/15 0532 05/21/15 0524  NA 141  --  139 140  K 3.9  --  3.7 3.7  CL 111  --  110 108  CO2 23  --  19* 23  BUN 15  --  15 11  CREATININE 0.64 0.63 0.65 0.71  CALCIUM 7.9*  --  7.9* 8.0*  MG  --   --  1.8  --   PHOS  --   --  3.0  --   GLUCOSE 97  --  69 83   Protein Profile:  Recent Labs Lab 05/15/15 1500  ALBUMIN 2.4*    Gastrointestinal Profile: Last BM: 12/01   Nutrition-Focused Physical Exam Findings:  Unable to complete Nutrition-Focused physical exam at this time.     Weight Change: wt stable per wt encounters    Diet Order:  Diet full liquid Room service appropriate?: Yes; Fluid consistency:: Thin  Skin:   reviewed   Height:   Ht Readings from Last 1 Encounters:  05/16/15 5\' 2"  (1.575 m)     Weight:   Wt Readings from Last 1 Encounters:  05/16/15 93 lb 14.4 oz (42.593 kg)    Ideal Body Weight:     BMI:  Body mass index is 17.17 kg/(m^2).  Estimated Nutritional Needs:   Kcal:  BEE 843 kcals (IF 1.0-1.2, AF 1.2) [id number] kcals/d.  Protein:  (1.0-1.2 g/kg) 42-50 g/d  Fluid:  (25-7ml/kg) 1050-1252ml/d  EDUCATION NEEDS:   No education needs identified at this time  [place]. Zenia Resides, [place], [place] (pager)

## 2015-05-21 NOTE — Progress Notes (Signed)
Physical Therapy Treatment Patient Details Name: Shelly Ponce MRN: [id number] DOB: 06-08-1933 Today's Date: 05/21/2015    History of Present Illness presented to ER with abdominal pain, fever; admitted with sepsis related to UTI and perforated bowel with abscesses.  Status post ex-lap with appendectomy, I and D of multiple abdominal and pelvic abscesses 11/26.    PT Comments    Pt demonstrates improving ambulation distance on this date as well as participation with exercises. She is self-limiting for ambulation distance and after trip to bathroom refuses to ambulate in hall. Pt reporting minimal increase in pain with activity. Educated pt about using pillow hugging over abdomen with sneezing and coughing as well as exercises to minimize pain. Pt will benefit from skilled PT services to address deficits in strength, balance, and mobility in order to return to full function at home.   Follow Up Recommendations  SNF     Equipment Recommendations   (TBD at SNF)    Recommendations for Other Services       Precautions / Restrictions Precautions Precautions: Fall Restrictions Weight Bearing Restrictions: No    Mobility  Bed Mobility Overal bed mobility: Needs Assistance Bed Mobility: Supine to Sit;Sit to Supine Rolling: Modified independent (Device/Increase time)   Supine to sit: Min guard Sit to supine: Min guard   General bed mobility comments: Pt initially requesting assistance but encouraged pt to perform as much as possible. HOB elevated and use of bed rails but no assist required by therapist  Transfers Overall transfer level: Needs assistance Equipment used: Rolling walker (2 wheeled) Transfers: Sit to/from Stand Sit to Stand: Min guard Stand pivot transfers: Min guard       General transfer comment: Cues for safe hand placement but otherwise improving strength and speed noted during transfers  Ambulation/Gait Ambulation/Gait assistance: Min assist Ambulation  Distance (Feet): 30 Feet (15+15) Assistive device: Rolling walker (2 wheeled) Gait Pattern/deviations: Step-to pattern   Gait velocity interpretation: <1.8 ft/sec, indicative of risk for recurrent falls General Gait Details: step to gait pattern, antalgic L LE with decreased stance time L LE (baseline per patient).  Slow and deliberate. Pt ambulated to bathroom and back to bed. Encouraged further ambulation but pt refuses as this time   Financial trader Rankin (Stroke Patients Only)       Balance Overall balance assessment: Needs assistance   Sitting balance-Leahy Scale: Good     Standing balance support: Bilateral upper extremity supported Standing balance-Leahy Scale: Fair                      Cognition Arousal/Alertness: Awake/alert Behavior During Therapy: WFL for tasks assessed/performed Overall Cognitive Status: Within Functional Limits for tasks assessed                      Exercises General Exercises - Lower Extremity Long Arc Quad: Strengthening;Both;15 reps;Seated Heel Slides: Strengthening;Both;15 reps;Seated Hip ABduction/ADduction: Strengthening;Both;15 reps;Seated Hip Flexion/Marching: Strengthening;Both;15 reps;Seated Heel Raises: Strengthening;Both;15 reps;Seated Other Exercises Other Exercises: Toilet transfer with assist of 2 from CNA. Pt provided cues for safe hand placement, turning, and to sit down onto commode    General Comments        Pertinent Vitals/Pain Pain Assessment: 0-10 Pain Score: 5  Pain Location: abdomen Pain Descriptors / Indicators: Aching Pain Intervention(s): Monitored during session;Limited activity within patient's tolerance    Home Living  Prior Function            PT Goals (current goals can now be found in the care plan section) Acute Rehab PT Goals Patient Stated Goal: "to move around a little" PT Goal Formulation: With  patient Time For Goal Achievement: 06/01/15 Potential to Achieve Goals: Good Progress towards PT goals: Progressing toward goals    Frequency  Min 2X/week    PT Plan Current plan remains appropriate    Co-evaluation             End of Session Equipment Utilized During Treatment: Gait belt Activity Tolerance: Patient limited by fatigue Patient left: in bed;with call bell/phone within reach;with bed alarm set     Time: [id number] PT Time Calculation (min) (ACUTE ONLY): 27 min  Charges:  $Gait Training: 8-22 mins $Therapeutic Exercise: 8-22 mins                    G Codes:      Lyndel Safe Nysha Koplin PT, DPT   Consetta Cosner 05/21/2015, 5:21 PM

## 2015-05-21 NOTE — Progress Notes (Signed)
5 Days Post-Op   Subjective:  Patient appears to be subjectively improving. Continues to have minimal oral intake per nursing and patient. Patient's memory of days day events is limited. However she states that her abdomen does have some pain to her today she's had bowel movement without nausea or vomiting. Tolerating her full liquids but with minimal actual intake. Denies any fevers, chills, chest pain, short of breath.  Vital signs in last 24 hours: Temp:  [98.7 F (37.1 C)-99.3 F (37.4 C)] 98.7 F (37.1 C) (12/01 0426) Pulse Rate:  [94-116] 100 (12/01 0426) Resp:  [16-18] 18 (12/01 0426) BP: (127-137)/(65-68) 127/65 mmHg (12/01 0426) SpO2:  [97 %-99 %] 97 % (12/01 0426) Last BM Date: 05/20/15  Intake/Output from previous day: 11/30 0701 - 12/01 0700 In: 2105.8 [I.V.:2005.8; IV Piggyback:100] Out: 550 [Urine:550]  Physical exam: Gen.: No acute distress Chest: Clear to auscultation Heart: Regular rate and rhythm GI: Abdomen is soft, appropriately tender to palpation, midline incision well approximated without erythema or purulent drainage.  Lab Results:  CBC  Recent Labs  05/20/15 0532 05/21/15 0524  WBC 19.2* 15.9*  HGB 11.5* 11.3*  HCT 35.4 33.5*  PLT 306 322   CMP     Component Value Date/Time   NA 140 05/21/2015 0524   NA 141 08/13/2013 1330   K 3.7 05/21/2015 0524   K 4.0 08/13/2013 1330   CL 108 05/21/2015 0524   CL 108* 08/13/2013 1330   CO2 23 05/21/2015 0524   CO2 29 08/13/2013 1330   GLUCOSE 83 05/21/2015 0524   GLUCOSE 75 08/13/2013 1330   BUN 11 05/21/2015 0524   BUN 19* 08/13/2013 1330   CREATININE 0.71 05/21/2015 0524   CREATININE 0.75 08/13/2013 1330   CALCIUM 8.0* 05/21/2015 0524   CALCIUM 9.0 08/13/2013 1330   PROT 5.2* 05/15/2015 1500   ALBUMIN 2.4* 05/15/2015 1500   AST 30 05/15/2015 1500   ALT 14 05/15/2015 1500   ALKPHOS 88 05/15/2015 1500   BILITOT 0.6 05/15/2015 1500   GFRNONAA >60 05/21/2015 0524   GFRNONAA >60 08/13/2013 1330    GFRAA >60 05/21/2015 0524   GFRAA >60 08/13/2013 1330   PT/INR No results for input(s): LABPROT, INR in the last 72 hours.  Studies/Results: No results found.  Assessment/Plan: 81 year old female status post exploratory laparotomy for multiple interloop abscesses. Is having a slow return of bowel function. Continue IV fluids, IV antibiotics until bowel function returned enough to tolerate oral medications. Labs are improving.  Encourage ambulation and incentive from her usage Lovenox for DVT prophylaxis  Clayburn Pert, MD [facility] [address] [facility]

## 2015-05-22 ENCOUNTER — Encounter
Admission: RE | Admit: 2015-05-22 | Discharge: 2015-05-22 | Disposition: A | Discharge status: Home / Self Care | Payer: Medicare Other | Source: Ambulatory Visit | Attending: Internal Medicine | Admitting: Internal Medicine

## 2015-05-22 DIAGNOSIS — R197 Diarrhea, unspecified: Secondary | ICD-10-CM | POA: Insufficient documentation

## 2015-05-22 DIAGNOSIS — R1084 Generalized abdominal pain: Secondary | ICD-10-CM

## 2015-05-22 LAB — CBC
HCT: 32.8 % — ABNORMAL LOW (ref 35.0–47.0)
Hemoglobin: 11.1 g/dL — ABNORMAL LOW (ref 12.0–16.0)
MCH: 31.4 pg (ref 26.0–34.0)
MCHC: 33.8 g/dL (ref 32.0–36.0)
MCV: 92.8 fL (ref 80.0–100.0)
PLATELETS: 391 10*3/uL (ref 150–440)
RBC: 3.54 MIL/uL — AB (ref 3.80–5.20)
RDW: 14.7 % — AB (ref 11.5–14.5)
WBC: 11.7 10*3/uL — AB (ref 3.6–11.0)

## 2015-05-22 MED ORDER — ONDANSETRON 4 MG PO TBDP
4.0000 mg | ORAL_TABLET | Freq: Three times a day (TID) | ORAL | Status: DC | PRN
Start: 2015-05-22 — End: 2015-05-23
  Administered 2015-05-23: 4 mg via ORAL
  Filled 2015-05-22: qty 1

## 2015-05-22 MED ORDER — AMOXICILLIN-POT CLAVULANATE 875-125 MG PO TABS
1.0000 | ORAL_TABLET | Freq: Two times a day (BID) | ORAL | Status: DC
  Administered 2015-05-22 (×2): 1 via ORAL
  Filled 2015-05-22 (×2): qty 1

## 2015-05-22 NOTE — Progress Notes (Signed)
6 Days Post-Op   Subjective:  81 year old female 6 days status post export her laparotomy with multiple interloop abscesses. Patient states that she thinks she feels better today. She continues of the limited appetite but has not had any nausea or vomiting. Patient is passing flatus and had a bowel movement yesterday.  Vital signs in last 24 hours: Temp:  [98.4 F (36.9 C)-99.3 F (37.4 C)] 98.5 F (36.9 C) (12/02 0431) Pulse Rate:  [81-100] 100 (12/02 0431) Resp:  [16-18] 16 (12/02 0431) BP: (124-142)/(57-89) 135/89 mmHg (12/02 0431) SpO2:  [97 %-100 %] 100 % (12/02 0431) Last BM Date: 05/21/15  Intake/Output from previous day: 12/01 0701 - 12/02 0700 In: 2332 [P.O.:240; I.V.:2042; IV Piggyback:50] Out: [id number] [Urine:1650]  Physical exam: Gen.: Resting in bed in no acute distress Chest: Clear to auscultation Heart: Regular rhythm GI: Abdomen is soft, minimally tender to midline at her incision, nondistended, no evidence of erythema or drainage from midline.  Lab Results:  CBC  Recent Labs  05/21/15 0524 05/22/15 0724  WBC 15.9* 11.7*  HGB 11.3* 11.1*  HCT 33.5* 32.8*  PLT 322 391   CMP     Component Value Date/Time   NA 140 05/21/2015 0524   NA 141 08/13/2013 1330   K 3.7 05/21/2015 0524   K 4.0 08/13/2013 1330   CL 108 05/21/2015 0524   CL 108* 08/13/2013 1330   CO2 23 05/21/2015 0524   CO2 29 08/13/2013 1330   GLUCOSE 83 05/21/2015 0524   GLUCOSE 75 08/13/2013 1330   BUN 11 05/21/2015 0524   BUN 19* 08/13/2013 1330   CREATININE 0.71 05/21/2015 0524   CREATININE 0.75 08/13/2013 1330   CALCIUM 8.0* 05/21/2015 0524   CALCIUM 9.0 08/13/2013 1330   PROT 5.2* 05/15/2015 1500   ALBUMIN 2.4* 05/15/2015 1500   AST 30 05/15/2015 1500   ALT 14 05/15/2015 1500   ALKPHOS 88 05/15/2015 1500   BILITOT 0.6 05/15/2015 1500   GFRNONAA >60 05/21/2015 0524   GFRNONAA >60 08/13/2013 1330   GFRAA >60 05/21/2015 0524   GFRAA >60 08/13/2013 1330   PT/INR No results for  input(s): LABPROT, INR in the last 72 hours.  Studies/Results: No results found.  Assessment/Plan: 81 year old female postoperative day #6 status post export her laparotomy. Continues to improve. Labs have normalized today. Once patient can tolerate more regular diet we will transfer her back to oral medications with plans for return to SNF.   Clayburn Pert, MD FACS General Surgeon  05/22/2015

## 2015-05-22 NOTE — Progress Notes (Signed)
ANTIBIOTIC CONSULT NOTE - INITIAL  Pharmacy Consult for piperacillin/tazobactam Indication: Intra-abdominal infection  Allergies  Allergen Reactions  . Macrobid [Nitrofurantoin] Rash  . Sulfa Antibiotics Rash   Patient Measurements: Height: 5\' 2"  (157.5 cm) Weight: 93 lb 14.4 oz (42.593 kg) IBW/kg (Calculated) : 50.1  Vital Signs: Temp: 98.1 F (36.7 C) (12/02 1214) Temp Source: Oral (12/02 1214) BP: 117/62 mmHg (12/02 1214) Pulse Rate: 102 (12/02 1214) Intake/Output from previous day: 12/01 0701 - 12/02 0700 In: 2332 [P.O.:240; I.V.:2042; IV Piggyback:50] Out: [id number] [Urine:1650] Intake/Output from this shift: Total I/O In: -  Out: 600 [Urine:600]  Labs:  Recent Labs  05/20/15 0532 05/21/15 0524 05/22/15 0724  WBC 19.2* 15.9* 11.7*  HGB 11.5* 11.3* 11.1*  PLT 306 322 391  CREATININE 0.65 0.71  --    Estimated Creatinine Clearance: 37.7 mL/min (by C-G formula based on Cr of 0.71).   Microbiology: Recent Results (from the past 720 hour(s))  Blood Culture (routine x 2)     Status: None   Collection Time: 05/15/15  1:18 PM  Result Value Ref Range Status   Specimen Description BLOOD RIGHT ASSIST CONTROL  Final   Special Requests BOTTLES DRAWN AEROBIC AND ANAEROBIC  1CC  Final   Culture NO GROWTH 5 DAYS  Final   Report Status 05/20/2015 FINAL  Final  Blood Culture (routine x 2)     Status: None   Collection Time: 05/15/15  1:18 PM  Result Value Ref Range Status   Specimen Description BLOOD LEFT ASSIST CONTROL  Final   Special Requests BOTTLES DRAWN AEROBIC AND ANAEROBIC  1CC  Final   Culture NO GROWTH 5 DAYS  Final   Report Status 05/20/2015 FINAL  Final  Urine culture     Status: None   Collection Time: 05/15/15  4:41 PM  Result Value Ref Range Status   Specimen Description URINE, RANDOM  Final   Special Requests NONE  Final   Culture NO GROWTH 2 DAYS  Final   Report Status 05/17/2015 FINAL  Final  Anaerobic culture     Status: None   Collection Time:  05/16/15 12:54 PM  Result Value Ref Range Status   Specimen Description PERITONEAL  Final   Special Requests NONE  Final   Culture NO ANAEROBES ISOLATED  Final   Report Status 05/21/2015 FINAL  Final  Wound culture     Status: None   Collection Time: 05/16/15 12:54 PM  Result Value Ref Range Status   Specimen Description PERITONEAL  Final   Special Requests NONE  Final   Gram Stain FEW WBC SEEN NO ORGANISMS SEEN   Final   Culture RARE ESCHERICHIA COLI  Final   Report Status 05/20/2015 FINAL  Final   Organism ID, Bacteria ESCHERICHIA COLI  Final      Susceptibility   Escherichia coli - MIC*    AMPICILLIN >=32 RESISTANT Resistant     CEFAZOLIN <=4 SENSITIVE Sensitive     CEFTRIAXONE <=1 SENSITIVE Sensitive     CIPROFLOXACIN 1 SENSITIVE Sensitive     GENTAMICIN <=1 SENSITIVE Sensitive     IMIPENEM <=0.25 SENSITIVE Sensitive     NITROFURANTOIN <=16 SENSITIVE Sensitive     TRIMETH/SULFA >=320 RESISTANT Resistant     PIP/TAZO Value in next row Sensitive      SENSITIVE<=4    * RARE ESCHERICHIA COLI   Assessment: Pharmacy consulted to dose piperacillin/tazobactam in this 81 year old female for an intra-abdominal infection. Patient was admitted and underwent ex-lap on 11/26  for abscess and small bowel perforation. She is currently post-po day #6 and has been receiving piperacillin/tazobactam. Patient currently afebrile and WBC trending down. Patient unable to take PO medications at this time but plan is to transition to PO antibiotics once patient is able to tolerate PO medications.  Currently post-op day #6 antibiotics on piperacillin/tazobactam 3.375 g IV q8h EI CrCl ~ 38 mL/min  Plan:  Restart piperacillin/tazobactam 3.375 g IV q8h EI Pharmacy will continue to monitor, thank you for the consult.  Darylene Price Arshawn Valdez 05/22/2015,2:17 PM

## 2015-05-22 NOTE — Care Management Important Message (Signed)
Important Message  Patient Details  Name: Shelly Ponce MRN: [id number] Date of Birth: [birth year]   Medicare Important Message Given:  Yes    Juliann Pulse A Nikolina Simerson 05/22/2015, 10:09 AM

## 2015-05-23 MED ORDER — AMOXICILLIN-POT CLAVULANATE 500-125 MG PO TABS: 1.0000 | ORAL_TABLET | Freq: Three times a day (TID) | ORAL | Status: DC

## 2015-05-23 MED ORDER — ONDANSETRON 4 MG PO TBDP: 4.0000 mg | ORAL_TABLET | Freq: Three times a day (TID) | ORAL | Status: DC | PRN

## 2015-05-23 MED ORDER — HYDROCODONE-ACETAMINOPHEN 5-325 MG PO TABS: 1.0000 | ORAL_TABLET | Freq: Four times a day (QID) | ORAL | Status: DC | PRN

## 2015-05-23 MED ORDER — ENSURE ENLIVE PO LIQD: 237.0000 mL | Freq: Two times a day (BID) | ORAL | Status: DC

## 2015-05-23 MED ORDER — AMOXICILLIN-POT CLAVULANATE 500-125 MG PO TABS
1.0000 | ORAL_TABLET | Freq: Three times a day (TID) | ORAL | Status: DC
  Administered 2015-05-23: 500 mg via ORAL
  Filled 2015-05-23 (×3): qty 1

## 2015-05-23 NOTE — Progress Notes (Signed)
[facility] is present to transport patient to [employer]

## 2015-05-23 NOTE — Clinical Social Work Placement (Signed)
   CLINICAL SOCIAL WORK PLACEMENT  NOTE  Date:  05/23/2015  Patient Details  Name: Shelly Ponce MRN: [id number] Date of Birth: Nov 25, 1932  Clinical Social Work is seeking post-discharge placement for this patient at the [place] level of care (*CSW will initial, date and re-position this form in  chart as items are completed):  Yes   Patient/family provided with [place] Work Department's list of facilities offering this level of care within the geographic area requested by the patient (or if unable, by the patient's family).  Yes   Patient/family informed of their freedom to choose among providers that offer the needed level of care, that participate in Medicare, Medicaid or managed care program needed by the patient, have an available bed and are willing to accept the patient.  Yes   Patient/family informed of [place]'s ownership interest in [facility] and [facility], as well as of the fact that they are under no obligation to receive care at these facilities.  PASRR submitted to EDS on 05/19/15     PASRR number received on 05/19/15     Existing PASRR number confirmed on       FL2 transmitted to all facilities in geographic area requested by pt/family on 05/19/15     FL2 transmitted to all facilities within larger geographic area on       Patient informed that his/her managed care company has contracts with or will negotiate with certain facilities, including the following:            Patient/family informed of bed offers received.  Patient chooses bed at  [facility])     Physician recommends and patient chooses bed at      Patient to be transferred to  [facility]) on 05/23/15.  Patient to be transferred to facility by  (EMS)     Patient family notified on 05/23/15 of transfer.  Name of family member notified:   (friend Hoyle Sauer)     [place] Please sign FL2     Additional Comment:     _______________________________________________ Maurine Cane, LCSW 05/23/2015, 11:10 AM

## 2015-05-23 NOTE — Progress Notes (Signed)
Clinical Social Worker informed by Clayburn Pert,  MD that patient is medically ready to discharge to SNF, Patient is in a agreement with plan and asked CSW to call her friend Hoyle Sauer. . Patient's room number  213-A and number to call for report [phone] . All discharge information faxed to  Facility.  RN will call report and patient will discharge to [facility] via EMS.  Casimer Lanius. Latanya Presser, MSW Clinical Social Work Department [phone] 11:12 AM

## 2015-05-23 NOTE — Progress Notes (Signed)
CSW following for dc coordination. CSW confirmed plan to dc to [facility], bed ready. CSW followed up with MD regarding Pt's progress. Pt will likely be ready on Saturday. CSW met with Pt and her friend to go over plan for Saturday.    Shelly Ponce, [place]

## 2015-05-23 NOTE — Discharge Summary (Signed)
Patient ID: CLARICE SCHERFF MRN: [id number] DOB/AGE: 79-Dec-1934 79 y.o.  Admit date: 05/15/2015 Discharge date: 05/23/2015  Discharge Diagnoses:  Intra-abdominal abscesses  Procedures Performed: Exploratory laparotomy  Discharged Condition: good  Hospital Course: Patient admitted with sepsis, found to have multiple intra-abdominal abscesses that were drained surgically. Patient had an expected delay in return of bowel function but was able to tolerate a diet and transition to oral medications prior to discharge. Patient discharged to SNF per PT recommendations.  Discharge Orders: Discharge to SNF; continue antibiotics for 1 week; keep incision clean and dry covered with dressing until drainage stops. Follow up in clinic next week for drain removal.  Disposition: Discharge to skilled nursing facility  Discharge Medications:  Current facility-administered medications:  .  acetaminophen (TYLENOL) tablet 1,000 mg, 1,000 mg, Oral, Q6H, Hubbard Robinson, MD, 1,000 mg at 05/19/15 1800 .  amoxicillin-clavulanate (AUGMENTIN) 500-125 MG per tablet 500 mg, 1 tablet, Oral, TID, Clayburn Pert, MD, 500 mg at 05/23/15 1012 .  diclofenac sodium (VOLTAREN) 1 % transdermal gel 2 g, 2 g, Topical, QID PRN, [name], MD, 2 g at 05/23/15 0508 .  diphenhydrAMINE (BENADRYL) capsule 50 mg, 50 mg, Oral, QHS PRN, Lance Coon, MD, 50 mg at 05/22/15 2152 .  feeding supplement (ENSURE ENLIVE) (ENSURE ENLIVE) liquid 237 mL, 237 mL, Oral, BID BM, Sital Mody, MD, 237 mL at 05/23/15 1013 .  heparin injection 5,000 Units, 5,000 Units, Subcutaneous, 3 times per day, [name], MD, 5,000 Units at 05/23/15 [phone] .  HYDROcodone-acetaminophen (NORCO/VICODIN) 5-325 MG per tablet 1 tablet, 1 tablet, Oral, Q6H PRN, Clayburn Pert, MD, 1 tablet at 05/23/15 [phone] .  ondansetron (ZOFRAN) injection 4 mg, 4 mg, Intravenous, Q6H PRN, Epifanio Lesches, MD, 4 mg at 05/22/15 2152 .  ondansetron  (ZOFRAN-ODT) disintegrating tablet 4 mg, 4 mg, Oral, Q8H PRN, Clayburn Pert, MD  Follwup: Follow-up Information    Follow up with [facility] .   Specialty:  [place] information:   [address] [place] [phone]      Follow up with [facility]. Schedule an appointment as soon as possible for a visit in 5 days.   Specialty:  General Surgery   Why:  For wound re-check   Contact information:   [place] [phone]      Signed: Clayburn Pert 05/23/2015, 10:39 AM

## 2015-05-23 NOTE — Progress Notes (Signed)
Report was called to RN and [employer]  [facility] EMS has been contacted for non emergent transport.

## 2015-05-23 NOTE — Discharge Instructions (Signed)
Exploratory Laparotomy, Adult, Care After Refer to this sheet in the next few weeks. These instructions provide you with information about caring for yourself after your procedure. Your health care provider may also give you more specific instructions. Your treatment has been planned according to current medical practices, but problems sometimes occur. Call your health care provider if you have any problems or questions after your procedure. WHAT TO EXPECT AFTER THE PROCEDURE After your procedure, it is typical to have:  Abdominal soreness.  Fatigue.  A sore throat from tubes in your throat.  A lack of appetite. HOME CARE INSTRUCTIONS Medicines  Take medicines only as directed by your health care provider.  Do not drive or operate heavy machinery while taking pain medicine. Incision Care  There are many different ways to close and cover an incision, including stitches (sutures), skin glue, and adhesive strips. Follow your health care provider's instructions about:  Incision care.  Bandage (dressing) changes and removal.  Incision closure removal.  Do not take showers or baths until your health care provider says that you can.  Check your incision area daily for signs of infection. Watch for:  Redness.  Tenderness.  Swelling.  Drainage. Activities  Do not lift anything that is heavier than 10 pounds (4.5 kg) until your health care provider says that it is safe.  Try to walk a little bit each day if your health care provider says that it is okay.  Ask your health care provider when you can start to do your usual activities again, such as driving, going back to work, and having sex. Eating and Drinking  You may eat what you usually eat. Include lots of whole grains, fruits, and vegetables in your diet. This will help to prevent constipation.  Drink enough fluid to keep your urine clear or pale yellow. General Instructions  Keep all follow-up visits as directed by  your health care provider. This is important. SEEK MEDICAL CARE IF:   You have a fever.  You have chills.  Your pain medicine is not helping.  You have constipation or diarrhea.  You have nausea or vomiting.  You have drainage, redness, swelling, or pain at your incision site. SEEK IMMEDIATE MEDICAL CARE IF:  Your pain is getting worse.  It has been more than 3 days since you been able to have a bowel movement.  You have ongoing (persistent) vomiting.  The edges of your incision open up.  You have warmth, tenderness, and swelling in your calf.  You have trouble breathing.  You have chest pain.   This information is not intended to replace advice given to you by your health care provider. Make sure you discuss any questions you have with your health care provider.   Document Released: 01/19/2004 Document Revised: 06/27/2014 Document Reviewed: 01/22/2014 Elsevier Interactive Patient Education 2016 [place]: Patient has midline wound closed over a penrose drain. The dressing of plane gauze and ABD pad will need to be changed daily. The wound needs needs to be protected from getting wet until the drain is removed. (Should be removed in clinic next week) If drain comes out, do not replace it.

## 2015-05-23 NOTE — Final Progress Note (Signed)
7 Days Post-Op   Subjective:  Patient did well overnight. Tolerated diet. Stated oral antibiotics made her sick to her stomach. Having bowel function.  Vital signs in last 24 hours: Temp:  [98.1 F (36.7 C)-98.9 F (37.2 C)] 98.2 F (36.8 C) (12/03 0518) Pulse Rate:  [91-102] 91 (12/03 0518) Resp:  [16-18] 16 (12/03 0518) BP: (117-132)/(62-66) 128/66 mmHg (12/03 0518) SpO2:  [96 %-97 %] 96 % (12/03 0518) Last BM Date: 05/22/15  Intake/Output from previous day: 12/02 0701 - 12/03 0700 In: 2374.1 [P.O.:240; I.V.:2134.1] Out: 2150 [Urine:2150]  GI: Soft, minimally ttp at midline incision, staples and drain in place without evidence of erythema or drainage  Lab Results:  CBC  Recent Labs  05/21/15 0524 05/22/15 0724  WBC 15.9* 11.7*  HGB 11.3* 11.1*  HCT 33.5* 32.8*  PLT 322 391   CMP     Component Value Date/Time   NA 140 05/21/2015 0524   NA 141 08/13/2013 1330   K 3.7 05/21/2015 0524   K 4.0 08/13/2013 1330   CL 108 05/21/2015 0524   CL 108* 08/13/2013 1330   CO2 23 05/21/2015 0524   CO2 29 08/13/2013 1330   GLUCOSE 83 05/21/2015 0524   GLUCOSE 75 08/13/2013 1330   BUN 11 05/21/2015 0524   BUN 19* 08/13/2013 1330   CREATININE 0.71 05/21/2015 0524   CREATININE 0.75 08/13/2013 1330   CALCIUM 8.0* 05/21/2015 0524   CALCIUM 9.0 08/13/2013 1330   PROT 5.2* 05/15/2015 1500   ALBUMIN 2.4* 05/15/2015 1500   AST 30 05/15/2015 1500   ALT 14 05/15/2015 1500   ALKPHOS 88 05/15/2015 1500   BILITOT 0.6 05/15/2015 1500   GFRNONAA >60 05/21/2015 0524   GFRNONAA >60 08/13/2013 1330   GFRAA >60 05/21/2015 0524   GFRAA >60 08/13/2013 1330   PT/INR No results for input(s): LABPROT, INR in the last 72 hours.  Studies/Results: No results found.  Assessment/Plan: 81 y/o female s/p exploratory laparotomy for abscesses. Did well overnight. Able to be discharged to SNF today.   Clayburn Pert, MD FACS General Surgeon  05/23/2015

## 2015-05-25 ENCOUNTER — Telehealth: Payer: Self-pay

## 2015-05-25 NOTE — Telephone Encounter (Signed)
Post discharge call to patient made at this time. Pain is controlled at this time. No questions or concerns.   Confirmed patient appointment scheduled. Encouraged patient to call with any questions that arise prior to appointment.

## 2015-05-26 DIAGNOSIS — C4490 Unspecified malignant neoplasm of skin, unspecified: Secondary | ICD-10-CM

## 2015-05-26 DIAGNOSIS — M722 Plantar fascial fibromatosis: Secondary | ICD-10-CM | POA: Insufficient documentation

## 2015-05-26 DIAGNOSIS — R600 Localized edema: Secondary | ICD-10-CM

## 2015-05-26 DIAGNOSIS — M810 Age-related osteoporosis without current pathological fracture: Secondary | ICD-10-CM | POA: Insufficient documentation

## 2015-05-26 DIAGNOSIS — K5909 Other constipation: Secondary | ICD-10-CM

## 2015-05-26 DIAGNOSIS — D649 Anemia, unspecified: Secondary | ICD-10-CM

## 2015-05-26 DIAGNOSIS — I10 Essential (primary) hypertension: Secondary | ICD-10-CM

## 2015-05-26 DIAGNOSIS — K219 Gastro-esophageal reflux disease without esophagitis: Secondary | ICD-10-CM

## 2015-05-26 HISTORY — DX: Essential (primary) hypertension: I10

## 2015-05-26 HISTORY — DX: Gastro-esophageal reflux disease without esophagitis: K21.9

## 2015-05-26 HISTORY — DX: Localized edema: R60.0

## 2015-05-26 HISTORY — DX: Unspecified malignant neoplasm of skin, unspecified: C44.90

## 2015-05-26 HISTORY — DX: Other constipation: K59.09

## 2015-05-26 HISTORY — DX: Anemia, unspecified: D64.9

## 2015-05-27 ENCOUNTER — Ambulatory Visit (INDEPENDENT_AMBULATORY_CARE_PROVIDER_SITE_OTHER): Payer: Medicare Other | Admitting: General Surgery

## 2015-05-27 ENCOUNTER — Encounter: Payer: Self-pay | Admitting: General Surgery

## 2015-05-27 VITALS — BP 104/73 | HR 132 | Temp 98.1°F | Ht 63.0 in

## 2015-05-27 DIAGNOSIS — Z09 Encounter for follow-up examination after completed treatment for conditions other than malignant neoplasm: Secondary | ICD-10-CM

## 2015-05-27 NOTE — Progress Notes (Signed)
Outpatient Surgical Follow Up  05/27/2015  Shelly Ponce is an 81 y.o. female.   Chief Complaint  Patient presents with  . Routine Post Op    Exploratory Laparoscopic Appendectomy and Drainage of Abscess 05/16/2015 Dr. Burt Knack    HPI: 81 year old female returns to clinic for follow-up status post exploratory laparotomy. Patient states she is doing well. She denies any fevers, chills, nausea, vomiting, diarrhea, constipation. She does state she has a decreased appetite but this is normal for her. She's not having any pain and she desires to have her staples removed from her incision.  Past Medical History  Diagnosis Date  . Scoliosis   . Arthritis   . BP (high blood pressure) 05/26/2015  . Chronic constipation 05/26/2015  . Acid reflux 05/26/2015  . CA of skin 05/26/2015  . Edema leg 05/26/2015    Overview:  progressive   . Sepsis ([place]) 05/15/2015  . Cyst and pseudocyst of pancreas 06/02/2013  . Absolute anemia 05/26/2015    Past Surgical History  Procedure Laterality Date  . Tonsillectomy  1960  . Colonoscopy  2006  . Tubal ligation  1978  . Hernia repair Right 3/9/    inguinal hernia  . Laparotomy N/A 05/16/2015    Procedure: EXPLORATORY LAPAROTOMY, DRAINAGE OF INTRABDOMINAL ABSCESS;  Surgeon: Florene Glen, MD;  Location: [facility] ORS;  Service: General;  Laterality: N/A;  . Appendectomy  05/16/2015    Procedure: APPENDECTOMY;  Surgeon: Florene Glen, MD;  Location: [facility] ORS;  Service: General;;    Family History  Problem Relation Age of Onset  . Dementia Mother   . Leukemia Father     Social History:  reports that she has never smoked. She has never used smokeless tobacco. She reports that she does not drink alcohol or use illicit drugs.  Allergies:  Allergies  Allergen Reactions  . Macrobid [Nitrofurantoin] Rash  . Sulfa Antibiotics Rash    Medications reviewed.    ROS Multipoint review of systems was completed. All pertinent positives negatives in the  history of present illness remainder were negative.   BP 104/73 mmHg  Pulse 132  Temp(Src) 98.1 F (36.7 C) (Oral)  Ht 5\' 3"  (1.6 m)  Physical Exam  Gen.: No acute distress Chest: Clear to auscultation Heart: Tachycardic but regular Abdomen: Soft, nontender, nondistended. Well approximated midline incision with staples in place and Penrose drain in place. No evidence of erythema or purulent drainage.   No results found for this or any previous visit (from the past 48 hour(s)). No results found.  Assessment/Plan:  1. Follow-up examination following surgery 81 year old female doing well status post exploratory laparotomy with incidental appendectomy. Staples and Penrose drain removed in clinic today without any difficulty, location. These were replaced with benzoin and Steri-Strips. Discussed the signs and symptoms of infection with the patient and her caregiver. They both voiced understanding. She'll follow up again in 1 week for additional wound check.     Clayburn Pert, MD FACS General Surgeon  05/27/2015,2:17 PM

## 2015-05-27 NOTE — Patient Instructions (Signed)
We will see you in a week to follow up on your incision area with Dr. Burt Knack.

## 2015-06-03 ENCOUNTER — Encounter: Payer: Self-pay | Admitting: Internal Medicine

## 2015-06-05 ENCOUNTER — Ambulatory Visit (INDEPENDENT_AMBULATORY_CARE_PROVIDER_SITE_OTHER): Payer: Medicare Other | Admitting: Surgery

## 2015-06-05 ENCOUNTER — Other Ambulatory Visit
Admission: RE | Admit: 2015-06-05 | Discharge: 2015-06-05 | Disposition: A | Discharge status: Home / Self Care | Payer: Medicare Other | Source: Ambulatory Visit | Attending: Surgery | Admitting: Surgery

## 2015-06-05 ENCOUNTER — Encounter: Payer: Self-pay | Admitting: Surgery

## 2015-06-05 VITALS — BP 111/79 | HR 102 | Temp 98.6°F | Ht 63.0 in | Wt 85.0 lb

## 2015-06-05 DIAGNOSIS — R1084 Generalized abdominal pain: Secondary | ICD-10-CM

## 2015-06-05 DIAGNOSIS — Z029 Encounter for administrative examinations, unspecified: Secondary | ICD-10-CM | POA: Diagnosis present

## 2015-06-05 LAB — CBC WITH DIFFERENTIAL/PLATELET
Basophils Absolute: 0.1 10*3/uL (ref 0–0.1)
Basophils Relative: 1 %
EOS PCT: 2 %
Eosinophils Absolute: 0.2 10*3/uL (ref 0–0.7)
HCT: 36.8 % (ref 35.0–47.0)
HEMOGLOBIN: 11.9 g/dL — AB (ref 12.0–16.0)
LYMPHS ABS: 1 10*3/uL (ref 1.0–3.6)
LYMPHS PCT: 12 %
MCH: 30.4 pg (ref 26.0–34.0)
MCHC: 32.4 g/dL (ref 32.0–36.0)
MCV: 94 fL (ref 80.0–100.0)
MONOS PCT: 7 %
Monocytes Absolute: 0.7 10*3/uL (ref 0.2–0.9)
Neutro Abs: 7.1 10*3/uL — ABNORMAL HIGH (ref 1.4–6.5)
Neutrophils Relative %: 78 %
Platelets: 402 10*3/uL (ref 150–440)
RBC: 3.91 MIL/uL (ref 3.80–5.20)
RDW: 15.5 % — ABNORMAL HIGH (ref 11.5–14.5)
WBC: 9.1 10*3/uL (ref 3.6–11.0)

## 2015-06-05 LAB — COMPREHENSIVE METABOLIC PANEL
ALBUMIN: 2.7 g/dL — AB (ref 3.5–5.0)
ALT: 15 U/L (ref 14–54)
AST: 28 U/L (ref 15–41)
Alkaline Phosphatase: 66 U/L (ref 38–126)
Anion gap: 7 (ref 5–15)
BUN: 11 mg/dL (ref 6–20)
CHLORIDE: 110 mmol/L (ref 101–111)
CO2: 27 mmol/L (ref 22–32)
Calcium: 8.9 mg/dL (ref 8.9–10.3)
Creatinine, Ser: 0.65 mg/dL (ref 0.44–1.00)
GFR calc Af Amer: >60 mL/min (ref >60)
Glucose, Bld: 116 mg/dL — ABNORMAL HIGH (ref 65–99)
POTASSIUM: 4.5 mmol/L (ref 3.5–5.1)
Sodium: 144 mmol/L (ref 135–145)
Total Bilirubin: 0.9 mg/dL (ref 0.3–1.2)
Total Protein: 6.3 g/dL — ABNORMAL LOW (ref 6.5–8.1)

## 2015-06-05 NOTE — Progress Notes (Signed)
Outpatient postop visit  06/05/2015  Shelly Ponce is an 81 y.o. female.    Procedure:esx lap, interloop[ abscess  CC: Poor appetite and nausea  HPI: Patient status post exploratory laparotomy and drainage of intraloop abscesses multiple without obvious site of perforation. Patient is done with her antibiotics. Her chief complaint is poor appetite and constant nausea. She is having bowel movements but does not believe she is constipated however she has not passed gas today she has no abdominal pain but does have back pain.  Medications reviewed.    Physical Exam:  BP 111/79 mmHg  Pulse 102  Temp(Src) 98.6 F (37 C) (Oral)  Ht 5\' 3"  (1.6 m)  Wt 85 lb (38.556 kg)  BMI 15.06 kg/m2    PE: Extremely frail and weak elderly female patient brought in by wheelchair. Cachectic-appearing Abdomen is slightly distended only slightly tympanitic nontender wounds healing well no erythema no drainage and essentially nontender abdomen Muscle wasting is noted.    Assessment/Plan:  This patient is at extreme risk for recurrent abscess. She has no fevers but has not picked up her appetite and has considerable nausea. My first step would be to obtain a white blood cell count and other labs as a indirect indicator whether or not she has an infection she is afebrile however. This will require a CT scan as well. I offered to have the CT done this afternoon however the patient wishes to have it done on Monday. I counseled them as to returning to the emergency room should she worsen however this is been a fairly chronic condition over the last 2 weeks. We'll call him with the results.  Florene Glen, MD, FACS

## 2015-06-05 NOTE — Patient Instructions (Addendum)
We will get some labs today. We will send you to the medical mall at the hospital at this time for this lab.  We have also ordered a CT scan of your abdomen that will be done on Monday. You will need to pick up a prep kit at the radiology desk in the medical mall today for your CT scan. I will call [facility] with a arrival time.  We have also given you written orders for [facility] facility for Phenergan to be used instead of the Zofran for nausea.

## 2015-06-06 DIAGNOSIS — R197 Diarrhea, unspecified: Secondary | ICD-10-CM | POA: Diagnosis present

## 2015-06-06 LAB — C DIFFICILE QUICK SCREEN W PCR REFLEX
C Diff antigen: POSITIVE — AB
C Diff interpretation: POSITIVE
C Diff toxin: POSITIVE — AB

## 2015-06-08 ENCOUNTER — Telehealth: Payer: Self-pay | Admitting: Surgery

## 2015-06-08 ENCOUNTER — Ambulatory Visit
Admission: RE | Admit: 2015-06-08 | Discharge: 2015-06-08 | Disposition: A | Discharge status: Home / Self Care | Payer: Medicare Other | Source: Ambulatory Visit | Attending: Surgery | Admitting: Surgery

## 2015-06-08 DIAGNOSIS — K862 Cyst of pancreas: Secondary | ICD-10-CM | POA: Insufficient documentation

## 2015-06-08 DIAGNOSIS — R1084 Generalized abdominal pain: Secondary | ICD-10-CM | POA: Insufficient documentation

## 2015-06-08 DIAGNOSIS — K5732 Diverticulitis of large intestine without perforation or abscess without bleeding: Secondary | ICD-10-CM | POA: Diagnosis not present

## 2015-06-08 DIAGNOSIS — N200 Calculus of kidney: Secondary | ICD-10-CM | POA: Insufficient documentation

## 2015-06-08 MED ORDER — IOHEXOL 300 MG/ML  SOLN
80.0000 mL | Freq: Once | INTRAMUSCULAR | Status: AC | PRN
  Administered 2015-06-08: 80 mL via INTRAVENOUS

## 2015-06-08 NOTE — Telephone Encounter (Signed)
FYI - Any appointments that are made for Shelly Ponce please call [place] @ [phone]. 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 Thanks.

## 2016-11-21 ENCOUNTER — Encounter: Payer: Self-pay | Admitting: *Deleted

## 2016-11-22 NOTE — H&P (Signed)
See scanned note.

## 2016-11-23 ENCOUNTER — Encounter
Admission: RE | Disposition: A | Discharge status: Home / Self Care | Payer: Self-pay | Source: Ambulatory Visit | Attending: Ophthalmology

## 2016-11-23 ENCOUNTER — Encounter: Payer: Self-pay | Admitting: *Deleted

## 2016-11-23 ENCOUNTER — Ambulatory Visit: Payer: Medicare Other | Admitting: Anesthesiology

## 2016-11-23 ENCOUNTER — Ambulatory Visit
Admission: RE | Admit: 2016-11-23 | Discharge: 2016-11-23 | Disposition: A | Discharge status: Home / Self Care | Payer: Medicare Other | Source: Ambulatory Visit | Attending: Ophthalmology | Admitting: Ophthalmology

## 2016-11-23 DIAGNOSIS — I10 Essential (primary) hypertension: Secondary | ICD-10-CM | POA: Diagnosis not present

## 2016-11-23 DIAGNOSIS — K219 Gastro-esophageal reflux disease without esophagitis: Secondary | ICD-10-CM | POA: Insufficient documentation

## 2016-11-23 DIAGNOSIS — M1990 Unspecified osteoarthritis, unspecified site: Secondary | ICD-10-CM | POA: Insufficient documentation

## 2016-11-23 DIAGNOSIS — M5125 Other intervertebral disc displacement, thoracolumbar region: Secondary | ICD-10-CM | POA: Insufficient documentation

## 2016-11-23 DIAGNOSIS — H2512 Age-related nuclear cataract, left eye: Secondary | ICD-10-CM | POA: Diagnosis not present

## 2016-11-23 DIAGNOSIS — Z882 Allergy status to sulfonamides status: Secondary | ICD-10-CM | POA: Insufficient documentation

## 2016-11-23 HISTORY — PX: CATARACT EXTRACTION W/PHACO: SHX586

## 2016-11-23 HISTORY — DX: History of falling: Z91.81

## 2016-11-23 SURGERY — PHACOEMULSIFICATION, CATARACT, WITH IOL INSERTION
Anesthesia: Monitor Anesthesia Care | Site: Eye | Laterality: Left | Wound class: Clean

## 2016-11-23 MED ORDER — MOXIFLOXACIN HCL 0.5 % OP SOLN
1.0000 [drp] | OPHTHALMIC | Status: DC
  Administered 2016-11-23 (×3): 1 [drp] via OPHTHALMIC

## 2016-11-23 MED ORDER — PHENYLEPHRINE HCL 10 % OP SOLN
1.0000 [drp] | OPHTHALMIC | Status: AC
  Administered 2016-11-23 (×4): 1 [drp] via OPHTHALMIC

## 2016-11-23 MED ORDER — MOXIFLOXACIN HCL 0.5 % OP SOLN
OPHTHALMIC | Status: DC
Start: 2016-11-23 — End: 2016-11-23
  Filled 2016-11-23: qty 3

## 2016-11-23 MED ORDER — LIDOCAINE HCL (PF) 4 % IJ SOLN
INTRAOCULAR | Status: DC | PRN
  Administered 2016-11-23: 4 mL via OPHTHALMIC

## 2016-11-23 MED ORDER — ALFENTANIL 500 MCG/ML IJ INJ
INJECTION | INTRAVENOUS | Status: DC | PRN
  Administered 2016-11-23: 500 ug via INTRAVENOUS

## 2016-11-23 MED ORDER — ALFENTANIL 500 MCG/ML IJ INJ
INJECTION | INTRAVENOUS | Status: AC
  Filled 2016-11-23: qty 5

## 2016-11-23 MED ORDER — POVIDONE-IODINE 5 % OP SOLN
OPHTHALMIC | Status: DC | PRN
  Administered 2016-11-23: 1 via OPHTHALMIC

## 2016-11-23 MED ORDER — SODIUM CHLORIDE 0.9 % IV SOLN
INTRAVENOUS | Status: DC
  Administered 2016-11-23: 09:00:00 via INTRAVENOUS

## 2016-11-23 MED ORDER — CEFUROXIME OPHTHALMIC INJECTION 1 MG/0.1 ML
INJECTION | OPHTHALMIC | Status: DC | PRN
  Administered 2016-11-23: 1 mg via INTRACAMERAL

## 2016-11-23 MED ORDER — TETRACAINE HCL 0.5 % OP SOLN
OPHTHALMIC | Status: DC | PRN
  Administered 2016-11-23: 1 [drp] via OPHTHALMIC

## 2016-11-23 MED ORDER — HYALURONIDASE HUMAN 150 UNIT/ML IJ SOLN
INTRAMUSCULAR | Status: AC
  Filled 2016-11-23: qty 1

## 2016-11-23 MED ORDER — BUPIVACAINE HCL (PF) 0.75 % IJ SOLN
INTRAMUSCULAR | Status: AC
  Filled 2016-11-23: qty 10

## 2016-11-23 MED ORDER — MOXIFLOXACIN HCL 0.5 % OP SOLN
OPHTHALMIC | Status: DC | PRN
  Administered 2016-11-23: 0.2 mL via OPHTHALMIC

## 2016-11-23 MED ORDER — CYCLOPENTOLATE HCL 2 % OP SOLN
OPHTHALMIC | Status: AC
  Filled 2016-11-23: qty 2

## 2016-11-23 MED ORDER — EPINEPHRINE PF 1 MG/ML IJ SOLN
INTRAOCULAR | Status: DC | PRN
  Administered 2016-11-23: 10:00:00 via OPHTHALMIC

## 2016-11-23 MED ORDER — TETRACAINE HCL 0.5 % OP SOLN
OPHTHALMIC | Status: AC
  Filled 2016-11-23: qty 2

## 2016-11-23 MED ORDER — NA CHONDROIT SULF-NA HYALURON 40-17 MG/ML IO SOLN
INTRAOCULAR | Status: DC | PRN
  Administered 2016-11-23: 1 mL via INTRAOCULAR

## 2016-11-23 MED ORDER — LIDOCAINE HCL (PF) 4 % IJ SOLN
INTRAMUSCULAR | Status: DC | PRN
  Administered 2016-11-23: 10:00:00 via OPHTHALMIC

## 2016-11-23 MED ORDER — POVIDONE-IODINE 5 % OP SOLN
OPHTHALMIC | Status: AC
  Filled 2016-11-23: qty 30

## 2016-11-23 MED ORDER — LIDOCAINE HCL (PF) 2 % IJ SOLN
INTRAMUSCULAR | Status: AC
  Filled 2016-11-23: qty 2

## 2016-11-23 MED ORDER — CYCLOPENTOLATE HCL 2 % OP SOLN
1.0000 [drp] | OPHTHALMIC | Status: AC
  Administered 2016-11-23 (×3): 1 [drp] via OPHTHALMIC

## 2016-11-23 MED ORDER — CEFUROXIME OPHTHALMIC INJECTION 1 MG/0.1 ML
INJECTION | OPHTHALMIC | Status: AC
Start: 2016-11-23 — End: 2016-11-23
  Filled 2016-11-23: qty 0.1

## 2016-11-23 MED ORDER — NA CHONDROIT SULF-NA HYALURON 40-17 MG/ML IO SOLN
INTRAOCULAR | Status: AC
Start: 2016-11-23 — End: 2016-11-23
  Filled 2016-11-23: qty 1

## 2016-11-23 MED ORDER — PHENYLEPHRINE HCL 10 % OP SOLN
OPHTHALMIC | Status: AC
  Filled 2016-11-23: qty 5

## 2016-11-23 MED ORDER — EPINEPHRINE PF 1 MG/ML IJ SOLN
INTRAMUSCULAR | Status: AC
  Filled 2016-11-23: qty 2

## 2016-11-23 MED ORDER — CARBACHOL 0.01 % IO SOLN
INTRAOCULAR | Status: DC | PRN
  Administered 2016-11-23: 0.5 mL via INTRAOCULAR

## 2016-11-23 SURGICAL SUPPLY — 23 items
CORD BIP STRL DISP 12FT (MISCELLANEOUS) ×3 IMPLANT
DRAPE XRAY CASSETTE 23X24 (DRAPES) ×3 IMPLANT
ERASER HMR WETFIELD 18G (MISCELLANEOUS) ×3 IMPLANT
GLOVE BIO SURGEON STRL SZ8 (GLOVE) ×3 IMPLANT
GLOVE SURG LX 6.5 MICRO (GLOVE) ×2
GLOVE SURG LX 8.0 MICRO (GLOVE) ×2
GLOVE SURG LX STRL 6.5 MICRO (GLOVE) ×1 IMPLANT
GLOVE SURG LX STRL 8.0 MICRO (GLOVE) ×1 IMPLANT
GOWN STRL REUS W/ TWL LRG LVL3 (GOWN DISPOSABLE) ×1 IMPLANT
GOWN STRL REUS W/ TWL XL LVL3 (GOWN DISPOSABLE) ×1 IMPLANT
GOWN STRL REUS W/TWL LRG LVL3 (GOWN DISPOSABLE) ×3
GOWN STRL REUS W/TWL XL LVL3 (GOWN DISPOSABLE) ×3
LENS IOL ACRYSOF IQ 21.0 (Intraocular Lens) ×2 IMPLANT
PACK CATARACT (MISCELLANEOUS) ×3 IMPLANT
PACK CATARACT DINGLEDEIN LX (MISCELLANEOUS) ×3 IMPLANT
PACK EYE AFTER SURG (MISCELLANEOUS) ×3 IMPLANT
SHLD EYE VISITEC  UNIV (MISCELLANEOUS) ×3 IMPLANT
SOL BSS BAG (MISCELLANEOUS) ×3
SOLUTION BSS BAG (MISCELLANEOUS) ×1 IMPLANT
SUT SILK 5-0 (SUTURE) ×3 IMPLANT
SYR 5ML LL (SYRINGE) ×3 IMPLANT
WATER STERILE IRR 250ML POUR (IV SOLUTION) ×3 IMPLANT
WIPE NON LINTING 3.25X3.25 (MISCELLANEOUS) ×3 IMPLANT

## 2016-11-23 NOTE — Interval H&P Note (Signed)
History and Physical Interval Note:  11/23/2016 9:45 AM  Shelly Ponce  has presented today for surgery, with the diagnosis of NUCLEAR SCLEROTIC CATARACT LEFT EYE  The various methods of treatment have been discussed with the patient and family. After consideration of risks, benefits and other options for treatment, the patient has consented to  Procedure(s) with comments: CATARACT EXTRACTION PHACO AND INTRAOCULAR LENS PLACEMENT (IOC) (Left) - [place] AP% CDE Fluid Pack lot # [id number] as a surgical intervention .  The patient's history has been reviewed, patient examined, no change in status, stable for surgery.  I have reviewed the patient's chart and labs.  Questions were answered to the patient's satisfaction.     Falan Hensler

## 2016-11-23 NOTE — Anesthesia Postprocedure Evaluation (Signed)
Anesthesia Post Note  Patient: Shelly Ponce  Procedure(s) Performed: Procedure(s) (LRB): CATARACT EXTRACTION PHACO AND INTRAOCULAR LENS PLACEMENT (IOC) (Left)  Patient location during evaluation: Short Stay Anesthesia Type: MAC Level of consciousness: awake and alert Pain management: pain level controlled Vital Signs Assessment: post-procedure vital signs reviewed and stable Respiratory status: spontaneous breathing, nonlabored ventilation, respiratory function stable and patient connected to nasal cannula oxygen Cardiovascular status: stable and blood pressure returned to baseline Anesthetic complications: no     Last Vitals:  Vitals:   11/23/16 1026 11/23/16 1027  BP: (!) 161/75 (!) 161/75  Pulse: 67 64  Resp: 12 16  Temp: 36.6 C 36.6 C    Last Pain:  Vitals:   11/23/16 1027  TempSrc: Oral                 Estill Batten

## 2016-11-23 NOTE — Anesthesia Preprocedure Evaluation (Signed)
Anesthesia Evaluation  Patient identified by MRN, date of birth, ID band Patient awake    Reviewed: Allergy & Precautions, NPO status , Patient's Chart, lab work & pertinent test results  Airway Mallampati: II       Dental  (+) Teeth Intact   Pulmonary neg pulmonary ROS,    breath sounds clear to auscultation       Cardiovascular Exercise Tolerance: Good hypertension, Pt. on medications  Rhythm:Regular     Neuro/Psych negative neurological ROS  negative psych ROS   GI/Hepatic Neg liver ROS, GERD  Medicated,  Endo/Other  negative endocrine ROS  Renal/GU negative Renal ROS     Musculoskeletal   Abdominal Normal abdominal exam  (+)   Peds negative pediatric ROS (+)  Hematology  (+) anemia ,   Anesthesia Other Findings   Reproductive/Obstetrics                             Anesthesia Physical Anesthesia Plan  ASA: II  Anesthesia Plan: MAC   Post-op Pain Management:    Induction: Intravenous  PONV Risk Score and Plan: 0  Airway Management Planned: Natural Airway  Additional Equipment:   Intra-op Plan:   Post-operative Plan:   Informed Consent: I have reviewed the patients History and Physical, chart, labs and discussed the procedure including the risks, benefits and alternatives for the proposed anesthesia with the patient or authorized representative who has indicated his/her understanding and acceptance.     Plan Discussed with: CRNA  Anesthesia Plan Comments:         Anesthesia Quick Evaluation

## 2016-11-23 NOTE — Transfer of Care (Signed)
Immediate Anesthesia Transfer of Care Note  Patient: Shelly Ponce  Procedure(s) Performed: Procedure(s) with comments: CATARACT EXTRACTION PHACO AND INTRAOCULAR LENS PLACEMENT (IOC) (Left) - [place] 00:58.4 AP% 22.4 CDE 22.49 Fluid Pack lot # [id number]  Patient Location: Short Stay  Anesthesia Type:MAC  Level of Consciousness: awake, alert  and oriented  Airway & Oxygen Therapy: Patient Spontanous Breathing  Post-op Assessment: Report given to RN  Post vital signs: stable  Last Vitals:  Vitals:   11/23/16 0847 11/23/16 1026  BP: (!) 154/60 (!) 161/75  Pulse: 62 67  Resp: 18 12  Temp: 36.6 C 36.6 C    Last Pain:  Vitals:   11/23/16 1026  TempSrc: Oral         Complications: No apparent anesthesia complications

## 2016-11-23 NOTE — Discharge Instructions (Addendum)
Eye Surgery Discharge Instructions  Expect mild scratchy sensation or mild soreness. DO NOT RUB YOUR EYE!  The day of surgery:  Minimal physical activity, but bed rest is not required  No reading, computer work, or close hand work  No bending, lifting, or straining.  May watch TV  For 24 hours:  No driving, legal decisions, or alcoholic beverages  Safety precautions  Eat anything you prefer: It is better to start with liquids, then soup then solid foods.  _____ Eye patch should be worn until postoperative exam tomorrow.  ____ Solar shield eyeglasses should be worn for comfort in the sunlight/patch while sleeping  Resume all regular medications including aspirin or Coumadin if these were discontinued prior to surgery. You may shower, bathe, shave, or wash your hair. Tylenol may be taken for mild discomfort.  Call your doctor if you experience significant pain, nausea, or vomiting, fever > 101 or other signs of infection. [phone] or [phone] Specific instructions:  Follow-up Information    Daelyn Pettaway, Remo Lipps, MD Follow up.   Specialty:  Ophthalmology Why:  Thursday 11/24/16 @ 11:00 am Contact information: [address]   [place] [address] [phone]

## 2016-11-23 NOTE — Anesthesia Post-op Follow-up Note (Cosign Needed)
Anesthesia QCDR form completed.        

## 2016-11-23 NOTE — Op Note (Signed)
Date of Surgery: 11/23/2016 Date of Dictation: 11/23/2016 10:24 AM Pre-operative Diagnosis:  Nuclear Sclerotic Cataract left Eye Post-operative Diagnosis: same Procedure performed: Extra-capsular Cataract Extraction (ECCE) with placement of a posterior chamber intraocular lens (IOL) left Eye IOL:  Implant Name Type Inv. Item Serial No. Manufacturer Lot No. LRB No. Used  LENS IOL ACRYSOF IQ 21.0 - [id number] Intraocular Lens LENS IOL ACRYSOF IQ 21.0 [id number] ALCON   Left 1   Anesthesia: 2% Lidocaine and 4% Marcaine in a 50/50 mixture with 10 unites/ml of Hylenex given as a peribulbar Anesthesiologist: Anesthesiologist: [place] [facility], Jane Canary, MD CRNA: Aline Brochure, CRNA Complications: none Estimated Blood Loss: less than 1 ml  Description of procedure:  The patient was given anesthesia and sedation via intravenous access. The patient was then prepped and draped in the usual fashion. A 25-gauge needle was bent for initiating the capsulorhexis. A 5-0 silk suture was placed through the conjunctiva superior and inferiorly to serve as bridle sutures. Hemostasis was obtained at the superior limbus using an eraser cautery. A partial thickness groove was made at the anterior surgical limbus with a 64 Beaver blade and this was dissected anteriorly with an Avaya. The anterior chamber was entered at 10 o'clock with a 1.0 mm paracentesis knife and through the lamellar dissection with a 2.6 mm Alcon keratome. Epi-Shugarcaine 0.5 CC [9 cc BSS Plus (Alcon), 3 cc 4% preservative-free lidocaine (Hospira) and 4 cc 1:1000 preservative-free, bisulfite-free epinephrine] was injected into the anterior chamber via the paracentesis tract. Epi-Shugarcaine 0.5 CC [9 cc BSS Plus (Alcon), 3 cc 4% preservative-free lidocaine (Hospira) and 4 cc 1:1000 preservative-free, bisulfite-free epinephrine] was injected into the anterior chamber via the paracentesis tract. DiscoVisc was injected to replace the  aqueous and a continuous tear curvilinear capsulorhexis was performed using a bent 25-gauge needle.  Balance salt on a syringe was used to perform hydro-dissection and phacoemulsification was carried out using a divide and conquer technique. Procedure(s) with comments: CATARACT EXTRACTION PHACO AND INTRAOCULAR LENS PLACEMENT (IOC) (Left) - [place] 00:58.4 AP% 22.4 CDE 22.49 Fluid Pack lot # [id number]. Irrigation/aspiration was used to remove the residual cortex and the capsular bag was inflated with DiscoVisc. The intraocular lens was inserted into the capsular bag using a pre-loaded UltraSert Delivery System. Irrigation/aspiration was used to remove the residual DiscoVisc. The wound was inflated with balanced salt and checked for leaks. None were found. Miostat was injected via the paracentesis track and 0.1 ml of cefuroxime containing 1 mg of drug  was injected via the paracentesis track. The wound was checked for leaks again and none were found.   The bridal sutures were removed and two drops of Vigamox were placed on the eye. An eye shield was placed to protect the eye and the patient was discharged to the recovery area in good condition.   Layloni Fahrner MD

## 2016-11-23 NOTE — Anesthesia Postprocedure Evaluation (Signed)
Anesthesia Post Note  Patient: Shelly Ponce  Procedure(s) Performed: Procedure(s) (LRB): CATARACT EXTRACTION PHACO AND INTRAOCULAR LENS PLACEMENT (IOC) (Left)  Patient location during evaluation: PACU Anesthesia Type: MAC Level of consciousness: awake Pain management: pain level controlled Vital Signs Assessment: post-procedure vital signs reviewed and stable Respiratory status: spontaneous breathing Cardiovascular status: stable Anesthetic complications: no     Last Vitals:  Vitals:   11/23/16 1026 11/23/16 1027  BP: (!) 161/75 (!) 161/75  Pulse: 67 64  Resp: 12 16  Temp: 36.6 C 36.6 C    Last Pain:  Vitals:   11/23/16 1027  TempSrc: Oral                 VAN STAVEREN,Yakub Lodes

## 2017-06-26 ENCOUNTER — Emergency Department: Payer: Medicare Other

## 2017-06-26 ENCOUNTER — Other Ambulatory Visit: Payer: Self-pay

## 2017-06-26 ENCOUNTER — Inpatient Hospital Stay: Admit: 2017-06-26 | Payer: Medicare Other

## 2017-06-26 ENCOUNTER — Observation Stay
Admission: EM | Admit: 2017-06-26 | Discharge: 2017-06-27 | Disposition: A | Discharge status: Nursing Home (Includes ICF) | Payer: Medicare Other | Attending: Internal Medicine | Admitting: Internal Medicine

## 2017-06-26 ENCOUNTER — Encounter: Payer: Self-pay | Admitting: Emergency Medicine

## 2017-06-26 DIAGNOSIS — Z66 Do not resuscitate: Secondary | ICD-10-CM | POA: Diagnosis not present

## 2017-06-26 DIAGNOSIS — K219 Gastro-esophageal reflux disease without esophagitis: Secondary | ICD-10-CM | POA: Insufficient documentation

## 2017-06-26 DIAGNOSIS — E43 Unspecified severe protein-calorie malnutrition: Secondary | ICD-10-CM | POA: Diagnosis not present

## 2017-06-26 DIAGNOSIS — R030 Elevated blood-pressure reading, without diagnosis of hypertension: Secondary | ICD-10-CM | POA: Insufficient documentation

## 2017-06-26 DIAGNOSIS — R319 Hematuria, unspecified: Secondary | ICD-10-CM | POA: Diagnosis not present

## 2017-06-26 DIAGNOSIS — Z79899 Other long term (current) drug therapy: Secondary | ICD-10-CM | POA: Insufficient documentation

## 2017-06-26 DIAGNOSIS — I4891 Unspecified atrial fibrillation: Principal | ICD-10-CM | POA: Insufficient documentation

## 2017-06-26 DIAGNOSIS — M1990 Unspecified osteoarthritis, unspecified site: Secondary | ICD-10-CM | POA: Insufficient documentation

## 2017-06-26 DIAGNOSIS — Z87440 Personal history of urinary (tract) infections: Secondary | ICD-10-CM | POA: Insufficient documentation

## 2017-06-26 DIAGNOSIS — M419 Scoliosis, unspecified: Secondary | ICD-10-CM | POA: Insufficient documentation

## 2017-06-26 DIAGNOSIS — R000 Tachycardia, unspecified: Secondary | ICD-10-CM | POA: Insufficient documentation

## 2017-06-26 DIAGNOSIS — N390 Urinary tract infection, site not specified: Secondary | ICD-10-CM | POA: Diagnosis not present

## 2017-06-26 DIAGNOSIS — Z681 Body mass index (BMI) 19 or less, adult: Secondary | ICD-10-CM | POA: Insufficient documentation

## 2017-06-26 DIAGNOSIS — Z882 Allergy status to sulfonamides status: Secondary | ICD-10-CM | POA: Diagnosis not present

## 2017-06-26 DIAGNOSIS — Z881 Allergy status to other antibiotic agents status: Secondary | ICD-10-CM | POA: Insufficient documentation

## 2017-06-26 DIAGNOSIS — Z7982 Long term (current) use of aspirin: Secondary | ICD-10-CM | POA: Insufficient documentation

## 2017-06-26 DIAGNOSIS — I482 Chronic atrial fibrillation, unspecified: Secondary | ICD-10-CM | POA: Diagnosis present

## 2017-06-26 LAB — COMPREHENSIVE METABOLIC PANEL
ALK PHOS: 70 U/L (ref 38–126)
ALT: 14 U/L (ref 14–54)
AST: 24 U/L (ref 15–41)
Albumin: 3.8 g/dL (ref 3.5–5.0)
Anion gap: 8 (ref 5–15)
BUN: 20 mg/dL (ref 6–20)
CALCIUM: 8.8 mg/dL — AB (ref 8.9–10.3)
CO2: 25 mmol/L (ref 22–32)
Chloride: 107 mmol/L (ref 101–111)
Creatinine, Ser: 0.8 mg/dL (ref 0.44–1.00)
Glucose, Bld: 93 mg/dL (ref 65–99)
Potassium: 4.4 mmol/L (ref 3.5–5.1)
Sodium: 140 mmol/L (ref 135–145)
Total Bilirubin: 0.6 mg/dL (ref 0.3–1.2)
Total Protein: 6.2 g/dL — ABNORMAL LOW (ref 6.5–8.1)

## 2017-06-26 LAB — TSH
TSH: 1.943 u[IU]/mL (ref 0.350–4.500)
TSH: 1.881 u[IU]/mL (ref 0.350–4.500)

## 2017-06-26 LAB — TROPONIN I

## 2017-06-26 LAB — URINALYSIS, COMPLETE (UACMP) WITH MICROSCOPIC
Bilirubin Urine: NEGATIVE
GLUCOSE, UA: NEGATIVE mg/dL
Ketones, ur: 5 mg/dL — AB
NITRITE: NEGATIVE
PH: 5 (ref 5.0–8.0)
Protein, ur: 30 mg/dL — AB
SPECIFIC GRAVITY, URINE: 1.032 — AB (ref 1.005–1.030)

## 2017-06-26 LAB — APTT: aPTT: 32 seconds (ref 24–36)

## 2017-06-26 LAB — CBC
HEMATOCRIT: 38.8 % (ref 35.0–47.0)
HEMOGLOBIN: 13.1 g/dL (ref 12.0–16.0)
MCH: 32.4 pg (ref 26.0–34.0)
MCHC: 33.8 g/dL (ref 32.0–36.0)
MCV: 95.7 fL (ref 80.0–100.0)
Platelets: 222 10*3/uL (ref 150–440)
RBC: 4.06 MIL/uL (ref 3.80–5.20)
RDW: 14.7 % — AB (ref 11.5–14.5)
WBC: 6.6 10*3/uL (ref 3.6–11.0)

## 2017-06-26 LAB — PROTIME-INR
INR: 0.97
Prothrombin Time: 12.8 seconds (ref 11.4–15.2)

## 2017-06-26 LAB — T4, FREE: Free T4: 1.1 ng/dL (ref 0.61–1.12)

## 2017-06-26 LAB — MAGNESIUM: MAGNESIUM: 2.1 mg/dL (ref 1.7–2.4)

## 2017-06-26 MED ORDER — VITAMIN E 180 MG (400 UNIT) PO CAPS
400.0000 [IU] | ORAL_CAPSULE | Freq: Every day | ORAL | Status: DC
  Administered 2017-06-27: 400 [IU] via ORAL
  Filled 2017-06-26: qty 1

## 2017-06-26 MED ORDER — DILTIAZEM HCL 100 MG IV SOLR
5.0000 mg/h | Freq: Once | INTRAVENOUS | Status: AC
  Administered 2017-06-26: 5 mg/h via INTRAVENOUS
  Filled 2017-06-26: qty 100

## 2017-06-26 MED ORDER — HYDROCODONE-ACETAMINOPHEN 5-325 MG PO TABS
1.0000 | ORAL_TABLET | Freq: Two times a day (BID) | ORAL | Status: DC
  Administered 2017-06-26 – 2017-06-27 (×2): 1 via ORAL
  Filled 2017-06-26 (×2): qty 1

## 2017-06-26 MED ORDER — SODIUM CHLORIDE 0.9 % IV SOLN
Freq: Once | INTRAVENOUS | Status: AC
  Administered 2017-06-26: 14:00:00 via INTRAVENOUS

## 2017-06-26 MED ORDER — CEFTRIAXONE SODIUM IN DEXTROSE 20 MG/ML IV SOLN
1.0000 g | INTRAVENOUS | Status: DC
  Filled 2017-06-26: qty 50

## 2017-06-26 MED ORDER — ESTRADIOL 1 MG PO TABS
0.5000 mg | ORAL_TABLET | Freq: Every day | ORAL | Status: DC
  Administered 2017-06-27: 0.5 mg via ORAL
  Filled 2017-06-26: qty 0.5

## 2017-06-26 MED ORDER — ACETAMINOPHEN 325 MG PO TABS
650.0000 mg | ORAL_TABLET | Freq: Four times a day (QID) | ORAL | Status: DC | PRN
  Administered 2017-06-27: 650 mg via ORAL
  Filled 2017-06-26 (×2): qty 2

## 2017-06-26 MED ORDER — OCUVITE-LUTEIN PO CAPS
1.0000 | ORAL_CAPSULE | Freq: Two times a day (BID) | ORAL | Status: DC
  Administered 2017-06-26 – 2017-06-27 (×2): 1 via ORAL
  Filled 2017-06-26 (×3): qty 1

## 2017-06-26 MED ORDER — HYDROCODONE-ACETAMINOPHEN 5-325 MG PO TABS: 1.0000 | ORAL_TABLET | Freq: Two times a day (BID) | ORAL | Status: DC

## 2017-06-26 MED ORDER — HEPARIN BOLUS VIA INFUSION
2000.0000 [IU] | Freq: Once | INTRAVENOUS | Status: AC
  Administered 2017-06-26: 2000 [IU] via INTRAVENOUS
  Filled 2017-06-26: qty 2000

## 2017-06-26 MED ORDER — MELATONIN 5 MG PO TABS
10.0000 mg | ORAL_TABLET | Freq: Every evening | ORAL | Status: DC | PRN
  Administered 2017-06-26: 10 mg via ORAL
  Filled 2017-06-26 (×2): qty 2

## 2017-06-26 MED ORDER — ASPIRIN EC 81 MG PO TBEC
81.0000 mg | DELAYED_RELEASE_TABLET | Freq: Every day | ORAL | Status: DC
  Administered 2017-06-26 – 2017-06-27 (×2): 81 mg via ORAL
  Filled 2017-06-26 (×2): qty 1

## 2017-06-26 MED ORDER — ZOLPIDEM TARTRATE 5 MG PO TABS
5.0000 mg | ORAL_TABLET | Freq: Every evening | ORAL | Status: DC | PRN
  Administered 2017-06-26: 5 mg via ORAL
  Filled 2017-06-26: qty 1

## 2017-06-26 MED ORDER — DEXTROSE 5 % IV SOLN
INTRAVENOUS | Status: DC
  Administered 2017-06-27: 14:00:00 via INTRAVENOUS
  Filled 2017-06-26 (×2): qty 10

## 2017-06-26 MED ORDER — CEFTRIAXONE SODIUM IN DEXTROSE 20 MG/ML IV SOLN
1.0000 g | Freq: Once | INTRAVENOUS | Status: AC
  Administered 2017-06-26: 1 g via INTRAVENOUS
  Filled 2017-06-26: qty 50

## 2017-06-26 MED ORDER — ENOXAPARIN SODIUM 40 MG/0.4ML ~~LOC~~ SOLN: 40.0000 mg | SUBCUTANEOUS | Status: DC

## 2017-06-26 MED ORDER — DILTIAZEM HCL 25 MG/5ML IV SOLN
10.0000 mg | Freq: Once | INTRAVENOUS | Status: AC
  Administered 2017-06-26: 10 mg via INTRAVENOUS
  Filled 2017-06-26: qty 5

## 2017-06-26 MED ORDER — DILTIAZEM HCL ER COATED BEADS 180 MG PO CP24
180.0000 mg | ORAL_CAPSULE | Freq: Every day | ORAL | Status: DC
  Administered 2017-06-26: 180 mg via ORAL
  Filled 2017-06-26: qty 1

## 2017-06-26 MED ORDER — DEXTROSE 5 % IV SOLN
5.0000 mg/h | INTRAVENOUS | Status: DC
  Administered 2017-06-26: 15 mg/h via INTRAVENOUS
  Administered 2017-06-27: 5 mg/h via INTRAVENOUS
  Administered 2017-06-27: 15 mg/h via INTRAVENOUS
  Filled 2017-06-26 (×2): qty 100

## 2017-06-26 MED ORDER — HEPARIN (PORCINE) IN NACL 100-0.45 UNIT/ML-% IJ SOLN
700.0000 [IU]/h | INTRAMUSCULAR | Status: DC
  Administered 2017-06-26: 600 [IU]/h via INTRAVENOUS
  Filled 2017-06-26: qty 250

## 2017-06-26 MED ORDER — ADULT MULTIVITAMIN W/MINERALS CH
1.0000 | ORAL_TABLET | Freq: Every day | ORAL | Status: DC
  Administered 2017-06-27: 1 via ORAL
  Filled 2017-06-26: qty 1

## 2017-06-26 NOTE — ED Provider Notes (Signed)
[facility] Emergency Department Provider Note       Time seen: ----------------------------------------- 12:45 PM on 06/26/2017 -----------------------------------------   I have reviewed the triage vital signs and the nursing notes.  HISTORY   Chief Complaint Tachycardia    HPI Shelly Ponce is a 84 y.o. female with a history of anemia, GERD, constipation, sepsis who presents to the ED for tachycardia.  Patient went to her doctor for a checkup and discovered rapid heartbeat.  Rapid A. fib was noted on arrival.  She has no history of this, she denies any history of any arrhythmia.  She denies fevers, chills, chest pain, shortness of breath, vomiting or diarrhea.  Past Medical History:  Diagnosis Date  . Absolute anemia 05/26/2015  . Acid reflux 05/26/2015  . Arthritis   . BP (high blood pressure) 05/26/2015  . CA of skin 05/26/2015  . Chronic constipation 05/26/2015  . Cyst and pseudocyst of pancreas 06/02/2013  . Edema leg 05/26/2015   Overview:  progressive   . Risk for falls   . Scoliosis   . Sepsis ([place]) 05/15/2015    Patient Active Problem List   Diagnosis Date Noted  . Absolute anemia 05/26/2015  . Chronic constipation 05/26/2015  . Acid reflux 05/26/2015  . BP (high blood pressure) 05/26/2015  . Edema leg 05/26/2015  . OP (osteoporosis) 05/26/2015  . Dupuytren's contracture of foot 05/26/2015  . CA of skin 05/26/2015  . Abdominal pain   . Sepsis ([place]) 05/15/2015  . UTI (lower urinary tract infection) 05/15/2015  . Bursitis, trochanteric 01/22/2015  . Degeneration of intervertebral disc of lumbar region 01/23/2014  . Neuritis or radiculitis due to rupture of lumbar intervertebral disc 01/22/2014  . Cyst and pseudocyst of pancreas 06/02/2013    Past Surgical History:  Procedure Laterality Date  . APPENDECTOMY  05/16/2015   Procedure: APPENDECTOMY;  Surgeon: Florene Glen, MD;  Location: [facility] ORS;  Service: General;;  . CATARACT  EXTRACTION W/PHACO Left 11/23/2016   Procedure: CATARACT EXTRACTION PHACO AND INTRAOCULAR LENS PLACEMENT ([place]);  Surgeon: Estill Cotta, MD;  Location: [facility] ORS;  Service: Ophthalmology;  Laterality: Left;  [place] 00:58.4 AP% 22.4 CDE 22.49 Fluid Pack lot # [id number]  . COLONOSCOPY  2006  . HERNIA REPAIR Right 3/9/   inguinal hernia  . LAPAROTOMY N/A 05/16/2015   Procedure: EXPLORATORY LAPAROTOMY, DRAINAGE OF INTRABDOMINAL ABSCESS;  Surgeon: Florene Glen, MD;  Location: [facility] ORS;  Service: General;  Laterality: N/A;  . TONSILLECTOMY  1960  . TUBAL LIGATION  1978    Allergies Macrobid [nitrofurantoin] and Sulfa antibiotics  Social History Social History   Tobacco Use  . Smoking status: Never Smoker  . Smokeless tobacco: Never Used  Substance Use Topics  . Alcohol use: No  . Drug use: No    Review of Systems Constitutional: Negative for fever. Eyes: Negative for vision changes ENT:  Negative for congestion, sore throat Cardiovascular: Negative for chest pain. Respiratory: Negative for shortness of breath. Gastrointestinal: Negative for abdominal pain, vomiting and diarrhea. Genitourinary: Negative for dysuria. Musculoskeletal: Negative for back pain. Skin: Negative for rash. Neurological: Negative for headaches, focal weakness or numbness.  All systems negative/normal/unremarkable except as stated in the HPI  ____________________________________________   PHYSICAL EXAM:  VITAL SIGNS: ED Triage Vitals [06/26/17 1222]  Enc Vitals Group     BP 110/72     Pulse Rate (!) 153     Resp 16     Temp 98 F (36.7 C)  Temp Source Oral     SpO2 98 %     Weight 92 lb (41.7 kg)     Height 5\' 1"  (1.549 m)     Head Circumference      Peak Flow      Pain Score      Pain Loc      Pain Edu?      Excl. in [place]?     Constitutional: Alert and oriented. Well appearing and in no distress. Eyes: Conjunctivae are normal. Normal extraocular movements. ENT   Head:  Normocephalic and atraumatic.   Nose: No congestion/rhinnorhea.   Mouth/Throat: Mucous membranes are moist.   Neck: No stridor. Cardiovascular: Rapid rate, regular rhythm. No murmurs, rubs, or gallops. Respiratory: Normal respiratory effort without tachypnea nor retractions. Breath sounds are clear and equal bilaterally. No wheezes/rales/rhonchi. Gastrointestinal: Soft and nontender. Normal bowel sounds Musculoskeletal: Nontender with normal range of motion in extremities. No lower extremity tenderness nor edema. Neurologic:  Normal speech and language. No gross focal neurologic deficits are appreciated.  Skin:  Skin is warm, dry and intact. No rash noted. Psychiatric: Mood and affect are normal. Speech and behavior are normal.  ____________________________________________  EKG: Interpreted by me.  Atrial fibrillation with a rapid ventricular response, rate is 156 bpm, possible septal infarct age-indeterminate, normal QT.  Baseline artifact is noted  EKG interpreted by me, atrial fibrillation with a rate of 140 bpm, normal QRS size, normal QT. ____________________________________________  ED COURSE:  As part of my medical decision making, I reviewed the following data within the [place] History obtained from family if available, nursing notes, old chart and ekg, as well as notes from prior ED visits. Patient presented for tachycardia, we will assess with labs and imaging as indicated at this time. Clinical Course as of Jun 26 1352  Mon Jun 26, 2017  1300 Heart rate appears to be improved after IV Cardizem  [JW]    Clinical Course User Index [JW] Earleen Newport, MD   Procedures ____________________________________________   LABS (pertinent positives/negatives)  Labs Reviewed  CBC - Abnormal; Notable for the following components:      Result Value   RDW 14.7 (*)    All other components within normal limits  COMPREHENSIVE METABOLIC PANEL -  Abnormal; Notable for the following components:   Calcium 8.8 (*)    Total Protein 6.2 (*)    All other components within normal limits  URINALYSIS, COMPLETE (UACMP) WITH MICROSCOPIC - Abnormal; Notable for the following components:   Color, Urine AMBER (*)    APPearance HAZY (*)    Specific Gravity, Urine 1.032 (*)    Hgb urine dipstick SMALL (*)    Ketones, ur 5 (*)    Protein, ur 30 (*)    Leukocytes, UA MODERATE (*)    Bacteria, UA RARE (*)    Squamous Epithelial / LPF 0-5 (*)    All other components within normal limits  TROPONIN I  T4, FREE  TSH   CRITICAL CARE Performed by: Earleen Newport   Total critical care time: 30 minutes  Critical care time was exclusive of separately billable procedures and treating other patients.  Critical care was necessary to treat or prevent imminent or life-threatening deterioration.  Critical care was time spent personally by me on the following activities: development of treatment plan with patient and/or surrogate as well as nursing, discussions with consultants, evaluation of patient's response to treatment, examination of patient, obtaining history from patient  or surrogate, ordering and performing treatments and interventions, ordering and review of laboratory studies, ordering and review of radiographic studies, pulse oximetry and re-evaluation of patient's condition.  RADIOLOGY  Chest x-ray Is unremarkable ____________________________________________  DIFFERENTIAL DIAGNOSIS   A. fib, SVT, electrolyte abnormality, dehydration, anemia, hyperthyroidism  FINAL ASSESSMENT AND PLAN   Atrial fibrillation with a rapid ventricular response, cystitis   Plan: Patient had presented for fast heartbeat as noted on routine examination. Patient's labs did reveal cystitis.  She was given IV fluids as well as IV Rocephin. Patient's imaging is negative for acute process.  She was found to have new onset A. fib with rapid ventricular  response.  She remains in A. fib at this time although she did have some improvement with IV Cardizem.  She would benefit from observation in the hospital and we have placed on a Cardizem drip due to persistent tachycardia.   Earleen Newport, MD   Note: This note was generated in part or whole with voice recognition software. Voice recognition is usually quite accurate but there are transcription errors that can and very often do occur. I apologize for any typographical errors that were not detected and corrected.     Earleen Newport, MD 06/26/17 1355

## 2017-06-26 NOTE — Progress Notes (Signed)
ANTICOAGULATION CONSULT NOTE - Initial Consult  Pharmacy Consult for Heparin drip Indication: atrial fibrillation  Allergies  Allergen Reactions  . Macrobid [Nitrofurantoin] Rash  . Sulfa Antibiotics Rash    Patient Measurements: Height: 5\' 1"  (154.9 cm) Weight: 92 lb (41.7 kg) IBW/kg (Calculated) : 47.8 Heparin Dosing Weight: 41.7 kg  Vital Signs: Temp: 98 F (36.7 C) (01/07 1222) Temp Source: Oral (01/07 1222) BP: 127/89 (01/07 1415) Pulse Rate: 101 (01/07 1415)  Labs: Recent Labs    06/26/17 1236  HGB 13.1  HCT 38.8  PLT 222  CREATININE 0.80  TROPONINI <0.03    Estimated Creatinine Clearance: 35.7 mL/min (by C-G formula based on SCr of 0.8 mg/dL).   Medical History: Past Medical History:  Diagnosis Date  . Absolute anemia 05/26/2015  . Acid reflux 05/26/2015  . Arthritis   . BP (high blood pressure) 05/26/2015  . CA of skin 05/26/2015  . Chronic constipation 05/26/2015  . Cyst and pseudocyst of pancreas 06/02/2013  . Edema leg 05/26/2015   Overview:  progressive   . Risk for falls   . Scoliosis   . Sepsis ([place]) 05/15/2015     Assessment: 84 yo female here with new onset AFib with RVR starting on heparin drip. No oral anticoagulation noted on PTA med list.   Hgb 13.1, plt 222 - WNL  Goal of Therapy:  Heparin level 0.3-0.7 units/ml Monitor platelets by anticoagulation protocol: Yes   Plan:  APTT and INR added on - pending Will order heparin drip 2000 units IV x1 then heparin drip at 600 units/hr (=6 ml/hr) Heparin level 8h after start of drip, CBC in AM  Pharmacy will continue to follow.   Rocky Morel 06/26/2017,2:52 PM

## 2017-06-26 NOTE — ED Triage Notes (Signed)
Went to MD for check up and discovered had rapid heart rate, rapid a fib on arrival with no known history of a fib. Patient states is asymptomatic.

## 2017-06-26 NOTE — Consult Note (Signed)
Shelly Ponce  Patient ID: Shelly Ponce, MRN: [id number], DOB/AGE: 82-Nov-1934 82 y.o. Admit date: 06/26/2017   Date of Consult: 06/26/2017 Primary Physician: Adin Hector, MD Primary Cardiologist: None  Chief Complaint:  Chief Complaint  Patient presents with  . Tachycardia   Reason for Consult: Atrial fibrillation  HPI: 84 y.o. female with no evidence of previous cardiovascular issues who has had history of urinary tract infections and arthritis.  The patient has not been on any medication management for end of this and has had recently been examined by her physician.  At that examination she was found to have rapid ventricular rate.  She has not had any significant symptoms of chest pain shortness of breath weakness fatigue heart failure or anginal type symptoms.  The patient feels somewhat okay.  She was admitted to the hospital placed on diltiazem drip with better heart rate control 110 bpm down from 160 bpm.  She still feels fine.  We have discussed the risks and benefits of heart rate control and anticoagulation  Past Medical History:  Diagnosis Date  . Absolute anemia 05/26/2015  . Acid reflux 05/26/2015  . Arthritis   . BP (high blood pressure) 05/26/2015  . CA of skin 05/26/2015  . Chronic constipation 05/26/2015  . Cyst and pseudocyst of pancreas 06/02/2013  . Edema leg 05/26/2015   Overview:  progressive   . Risk for falls   . Scoliosis   . Sepsis ([place]) 05/15/2015      Surgical History:  Past Surgical History:  Procedure Laterality Date  . APPENDECTOMY  05/16/2015   Procedure: APPENDECTOMY;  Surgeon: Florene Glen, MD;  Location: [facility] ORS;  Service: General;;  . CATARACT EXTRACTION W/PHACO Left 11/23/2016   Procedure: CATARACT EXTRACTION PHACO AND INTRAOCULAR LENS PLACEMENT ([facility]);  Surgeon: Estill Cotta, MD;  Location: [facility] ORS;  Service: Ophthalmology;  Laterality: Left;  [place] 00:58.4 AP% 22.4 CDE 22.49 Fluid Pack lot # [id number]   . COLONOSCOPY  2006  . HERNIA REPAIR Right 3/9/   inguinal hernia  . LAPAROTOMY N/A 05/16/2015   Procedure: EXPLORATORY LAPAROTOMY, DRAINAGE OF INTRABDOMINAL ABSCESS;  Surgeon: Florene Glen, MD;  Location: [facility] ORS;  Service: General;  Laterality: N/A;  . TONSILLECTOMY  1960  . TUBAL LIGATION  1978     Home Meds: Prior to Admission medications   Medication Sig Start Date End Date Taking? Authorizing Provider  acetaminophen (TYLENOL) 325 MG tablet Take 650 mg by mouth every 4 (four) hours as needed.   Yes [provider]  estradiol (ESTRACE) 0.5 MG tablet Take 0.5 mg by mouth daily.   Yes [provider]  HYDROcodone-acetaminophen (NORCO/VICODIN) 5-325 MG tablet Take 1 tablet by mouth every 6 (six) hours as needed for moderate pain. Patient taking differently: Take 1 tablet by mouth 2 (two) times daily.  05/23/15  Yes Clayburn Pert, MD  loperamide (IMODIUM) 2 MG capsule Take 2 mg by mouth as needed for diarrhea or loose stools.   Yes [provider]  Multiple Vitamins-Minerals (PRESERVISION AREDS PO) Take 1 capsule by mouth 2 (two) times daily.   Yes [provider]  multivitamin-iron-minerals-folic acid (THERAPEUTIC-M) TABS tablet Take 1 tablet by mouth daily.   Yes [provider]  promethazine (PHENERGAN) 25 MG suppository Place 25 mg rectally every 8 (eight) hours as needed for nausea or vomiting.   Yes [provider]  senna-docusate (SENOKOT-S) 8.6-50 MG tablet Take 2 tablets by mouth at bedtime as needed  for mild constipation.   Yes [provider]  traZODone (DESYREL) 50 MG tablet Take 50 mg by mouth at bedtime.    Yes [provider]  vitamin E 400 UNIT capsule Take 400 Units by mouth daily.   Yes [provider]  aspirin EC 81 MG tablet Take 81 mg by mouth daily.    [provider]  Melatonin 10 MG TABS Take 10 mg by mouth at bedtime as needed.    [provider]  progesterone  (PROMETRIUM) 100 MG capsule Take 100 mg by mouth daily. 15 DAYS PER MONTH    [provider]    Inpatient Medications:  . diltiazem  180 mg Oral Daily   . [START ON 06/27/2017] cefTRIAXone (ROCEPHIN)  IV    . diltiazem (CARDIZEM) infusion    . heparin 600 Units/hr (06/26/17 1533)    Allergies:  Allergies  Allergen Reactions  . Macrobid [Nitrofurantoin] Rash  . Sulfa Antibiotics Rash    Social History   Socioeconomic History  . Marital status: Widowed    Spouse name: Not on file  . Number of children: Not on file  . Years of education: Not on file  . Highest education level: Not on file  Social Needs  . Financial resource strain: Not on file  . Food insecurity - worry: Not on file  . Food insecurity - inability: Not on file  . Transportation needs - medical: Not on file  . Transportation needs - non-medical: Not on file  Occupational History  . Not on file  Tobacco Use  . Smoking status: Never Smoker  . Smokeless tobacco: Never Used  Substance and Sexual Activity  . Alcohol use: No  . Drug use: No  . Sexual activity: Not on file  Other Topics Concern  . Not on file  Social History Narrative  . Not on file     Family History  Problem Relation Age of Onset  . Dementia Mother   . Leukemia Father      Review of Systems Positive for none Negative for: General:  chills, fever, night sweats or weight changes.  Cardiovascular: PND orthopnea syncope dizziness  Dermatological skin lesions rashes Respiratory: Cough congestion Urologic: Frequent urination urination at night and hematuria Abdominal: negative for nausea, vomiting, diarrhea, bright red blood per rectum, melena, or hematemesis Neurologic: negative for visual changes, and/or hearing changes  All other systems reviewed and are otherwise negative except as noted above.  Labs: Recent Labs    06/26/17 1236  TROPONINI <0.03   Lab Results  Component Value Date   WBC 6.6 06/26/2017   HGB 13.1  06/26/2017   HCT 38.8 06/26/2017   MCV 95.7 06/26/2017   PLT 222 06/26/2017    Recent Labs  Lab 06/26/17 1236  NA 140  K 4.4  CL 107  CO2 25  BUN 20  CREATININE 0.80  CALCIUM 8.8*  PROT 6.2*  BILITOT 0.6  ALKPHOS 70  ALT 14  AST 24  GLUCOSE 93   No results found for: CHOL, HDL, LDLCALC, TRIG No results found for: DDIMER  Radiology/Studies:  Dg Chest 1 View  Result Date: 06/26/2017 CLINICAL DATA:  Elevated heart rate.  Atrial fibrillation. EXAM: CHEST 1 VIEW COMPARISON:  05/15/2015 FINDINGS: Numerous leads and wires project over the chest. Midline trachea. Normal heart size. Atherosclerosis in the transverse aorta. No pleural effusion or pneumothorax. Clear lungs. No congestive failure. IMPRESSION: No acute cardiopulmonary disease. Aortic Atherosclerosis (ICD10-I70.0). Electronically Signed   By:  Abigail Miyamoto M.D.   On: 06/26/2017 13:21   Ct Renal Stone Study  Result Date: 06/26/2017 CLINICAL DATA:  Microscopic hematuria in a patient with left flank and hip pain. EXAM: CT ABDOMEN AND PELVIS WITHOUT CONTRAST TECHNIQUE: Multidetector CT imaging of the abdomen and pelvis was performed following the standard protocol without IV contrast. COMPARISON:  CT abdomen and pelvis 06/08/2015. FINDINGS: Lower chest: Lung bases are clear. No pleural or pericardial effusion. Hepatobiliary: No focal liver abnormality is seen. No gallstones, gallbladder wall thickening, or biliary dilatation. Pancreas: Unremarkable. No pancreatic ductal dilatation or surrounding inflammatory changes. Spleen: Normal in size without focal abnormality. Adrenals/Urinary Tract: Nonobstructing stone lower pole right kidney measures 0.3 cm. Punctate nonobstructing stone is seen in the lower pole of the left kidney. No hydronephrosis on the right or left and no ureteral or urinary bladder stones. Urinary bladder is unremarkable. The adrenal glands appear normal. Stomach/Bowel: Diverticulosis appears worst in the sigmoid. No  diverticulitis. The appendix has been removed. The stomach and small bowel are unremarkable. Vascular/Lymphatic: Aortic atherosclerosis. No enlarged abdominal or pelvic lymph nodes. Reproductive: Uterus and bilateral adnexa are unremarkable. Other: No hernia.  No fluid collection. Musculoskeletal: No lytic or sclerotic lesion. Severe convex right scoliosis with the apex at approximately L1 is noted. The patient has multilevel thoracic and lumbar degenerative change. IMPRESSION: No acute abnormality. Single small nonobstructing bilateral renal stones. Diverticulosis without diverticulitis. Atherosclerosis. Marked scoliosis and multilevel degenerative disc disease. Electronically Signed   By: Inge Rise M.D.   On: 06/26/2017 14:15    EKG: Atrial fibrillation with rapid ventricular rate  Weights: Filed Weights   06/26/17 1222  Weight: 41.7 kg (92 lb)     Physical Exam: Blood pressure 136/77, pulse (!) 116, temperature 98 F (36.7 C), temperature source Oral, resp. rate 17, height 5\' 1"  (1.549 m), weight 41.7 kg (92 lb), SpO2 99 %. Body mass index is 17.38 kg/m. General: Well developed, well nourished, in no acute distress. Head eyes ears nose throat: Normocephalic, atraumatic, sclera non-icteric, no xanthomas, nares are without discharge. No apparent thyromegaly and/or mass  Lungs: Normal respiratory effort.  no wheezes, no rales, no rhonchi.  Heart: Irregular with normal S1 S2. no murmur gallop, no rub, PMI is normal size and placement, carotid upstroke normal without bruit, jugular venous pressure is normal Abdomen: Soft, non-tender, non-distended with normoactive bowel sounds. No hepatomegaly. No rebound/guarding. No obvious abdominal masses. Abdominal aorta is normal size without bruit Extremities: No edema. no cyanosis, no clubbing, no ulcers  Peripheral : 2+ bilateral upper extremity pulses, 2+ bilateral femoral pulses, 2+ bilateral dorsal pedal pulse Neuro: Alert and oriented. No  facial asymmetry. No focal deficit. Moves all extremities spontaneously. Musculoskeletal: Normal muscle tone without kyphosis Psych:  Responds to questions appropriately with a normal affect.    Assessment: 84 year old female with atrial fibrillation with rapid ventricular rate and no current evidence of congestive heart failure or myocardial infarction or chest pain  Plan: 1.  Anticoagulation with heparin and potentially changing over to Eliquis 2.5 mg twice per day further risk reduction and stroke with atrial fibrillation 2.  Diltiazem drip for heart rate control and changing over to oral diltiazem CD at 180 mg 3.  Begin ambulation and following for heart rate control and need for adjustments of medications 4.  Discharged home tomorrow if accomplishing above with further diagnostic testing and treatment options as an outpatient  Signed, Shelly Ponce M.D. [facility] 06/26/2017, 5:14 PM

## 2017-06-26 NOTE — H&P (Signed)
[facility] NAME: Shelly Ponce    MR#:  [id number]  DATE OF BIRTH:  11/16/1932  DATE OF ADMISSION:  06/26/2017  PRIMARY CARE PHYSICIAN: Adin Hector, MD   REQUESTING/REFERRING PHYSICIAN: Dr Jimmye Norman  CHIEF COMPLAINT:   Tachycardia HISTORY OF PRESENT ILLNESS:  Shelly Ponce  is a 84 y.o. female with a known history of UTI in the past, acid reflux, high blood pressure comes to the emergency room after she went to her primary care for checkup and was discovered to have a rapid heart rate.  Patient was found to be in A. fib on arrival 160 heart rate.  She has no history of this denies any palpitations.  She denies any fever chills vomiting or diarrhea.  She was also found to have UTI.  Patient is being admitted with rapid A. fib in the setting of UTI. Currently on diltiazem 10 mg/h and IV heparin drip has been ordered.  PAST MEDICAL HISTORY:   Past Medical History:  Diagnosis Date  . Absolute anemia 05/26/2015  . Acid reflux 05/26/2015  . Arthritis   . BP (high blood pressure) 05/26/2015  . CA of skin 05/26/2015  . Chronic constipation 05/26/2015  . Cyst and pseudocyst of pancreas 06/02/2013  . Edema leg 05/26/2015   Overview:  progressive   . Risk for falls   . Scoliosis   . Sepsis ([place]) 05/15/2015    PAST SURGICAL HISTOIRY:   Past Surgical History:  Procedure Laterality Date  . APPENDECTOMY  05/16/2015   Procedure: APPENDECTOMY;  Surgeon: Florene Glen, MD;  Location: [facility] ORS;  Service: General;;  . CATARACT EXTRACTION W/PHACO Left 11/23/2016   Procedure: CATARACT EXTRACTION PHACO AND INTRAOCULAR LENS PLACEMENT ([place]);  Surgeon: Estill Cotta, MD;  Location: [facility] ORS;  Service: Ophthalmology;  Laterality: Left;  [place] 00:58.4 AP% 22.4 CDE 22.49 Fluid Pack lot # [id number]  . COLONOSCOPY  2006  . HERNIA REPAIR Right 3/9/   inguinal hernia  . LAPAROTOMY N/A 05/16/2015   Procedure: EXPLORATORY LAPAROTOMY, DRAINAGE  OF INTRABDOMINAL ABSCESS;  Surgeon: Florene Glen, MD;  Location: [facility] ORS;  Service: General;  Laterality: N/A;  . TONSILLECTOMY  1960  . TUBAL LIGATION  1978    SOCIAL HISTORY:   Social History   Tobacco Use  . Smoking status: Never Smoker  . Smokeless tobacco: Never Used  Substance Use Topics  . Alcohol use: No    FAMILY HISTORY:   Family History  Problem Relation Age of Onset  . Dementia Mother   . Leukemia Father     DRUG ALLERGIES:   Allergies  Allergen Reactions  . Macrobid [Nitrofurantoin] Rash  . Sulfa Antibiotics Rash    REVIEW OF SYSTEMS:  Review of Systems  Constitutional: Negative for chills, fever and weight loss.  HENT: Negative for ear discharge, ear pain and nosebleeds.   Eyes: Negative for blurred vision, pain and discharge.  Respiratory: Negative for sputum production, shortness of breath, wheezing and stridor.   Cardiovascular: Positive for palpitations. Negative for chest pain, orthopnea and PND.  Gastrointestinal: Negative for abdominal pain, diarrhea, nausea and vomiting.  Genitourinary: Negative for frequency and urgency.  Musculoskeletal: Negative for back pain and joint pain.  Neurological: Positive for weakness. Negative for sensory change, speech change and focal weakness.  Psychiatric/Behavioral: Negative for depression and hallucinations. The patient is not nervous/anxious.      MEDICATIONS AT HOME:   Prior to Admission medications  Medication Sig Start Date End Date Taking? Authorizing Provider  aspirin EC 81 MG tablet Take 81 mg by mouth daily.    [provider]  diclofenac sodium (VOLTAREN) 1 % GEL Apply 2 g topically 4 (four) times daily as needed (for pain).    [provider]  enoxaparin (LOVENOX) 40 MG/0.4ML injection Inject 40 mg into the skin daily.    [provider]  estradiol (ESTRACE) 0.5 MG tablet Take 0.5 mg by mouth daily.    [provider]  estrogen,  conjugated,-medroxyprogesterone (PREMPRO) 0.3-1.5 MG tablet Take 1 tablet by mouth daily.    [provider]  feeding supplement, ENSURE ENLIVE, (ENSURE ENLIVE) LIQD Take 237 mLs by mouth 2 (two) times daily between meals. Patient not taking: Reported on 11/21/2016 05/23/15   Clayburn Pert, MD  HYDROcodone-acetaminophen (NORCO/VICODIN) 5-325 MG tablet Take 1 tablet by mouth every 6 (six) hours as needed for moderate pain. 05/23/15   Clayburn Pert, MD  loperamide (IMODIUM) 2 MG capsule Take 2 mg by mouth as needed for diarrhea or loose stools.    [provider]  meloxicam (MOBIC) 7.5 MG tablet Take 7.5 mg by mouth 2 (two) times daily.    [provider]  mirtazapine (REMERON) 7.5 MG tablet Take 7.5 mg by mouth at bedtime.    [provider]  Multiple Vitamins-Minerals (PRESERVISION AREDS PO) Take 1 capsule by mouth 2 (two) times daily.    [provider]  ondansetron (ZOFRAN-ODT) 4 MG disintegrating tablet Take 1 tablet (4 mg total) by mouth every 8 (eight) hours as needed for nausea or vomiting. Patient not taking: Reported on 11/21/2016 05/23/15   Clayburn Pert, MD  progesterone (PROMETRIUM) 100 MG capsule Take 100 mg by mouth daily. 15 DAYS PER MONTH    [provider]  senna-docusate (SENOKOT-S) 8.6-50 MG tablet Take 2 tablets by mouth at bedtime as needed for mild constipation.    [provider]  traZODone (DESYREL) 50 MG tablet Take 50-100 mg by mouth at bedtime as needed for sleep.    [provider]      VITAL SIGNS:  Blood pressure 127/89, pulse (!) 126, temperature 98 F (36.7 C), temperature source Oral, resp. rate 18, height 5\' 1"  (1.549 m), weight 41.7 kg (92 lb), SpO2 97 %.  PHYSICAL EXAMINATION:  GENERAL:  84 y.o.-year-old patient lying in the bed with no acute distress.  EYES: Pupils equal, round, reactive to light and accommodation. No scleral icterus. Extraocular muscles intact.  HEENT: Head  atraumatic, normocephalic. Oropharynx and nasopharynx clear.  NECK:  Supple, no jugular venous distention. No thyroid enlargement, no tenderness.  LUNGS: Normal breath sounds bilaterally, no wheezing, rales,rhonchi or crepitation. No use of accessory muscles of respiration.  CARDIOVASCULAR: S1, S2 normal. No murmurs, rubs, or gallops.  Tachycardia  aBDOMEN: Soft, nontender, nondistended. Bowel sounds present. No organomegaly or mass.  EXTREMITIES: No pedal edema, cyanosis, or clubbing. Arthritis + NEUROLOGIC: Cranial nerves II through XII are intact. Muscle strength 5/5 in all extremities. Sensation intact. Gait not checked.  PSYCHIATRIC: The patient is alert and oriented x 3.  SKIN: No obvious rash, lesion, or ulcer.   LABORATORY PANEL:   CBC Recent Labs  Lab 06/26/17 1236  WBC 6.6  HGB 13.1  HCT 38.8  PLT 222   ------------------------------------------------------------------------------------------------------------------  Chemistries  Recent Labs  Lab 06/26/17 1236  NA 140  K 4.4  CL 107  CO2 25  GLUCOSE 93  BUN 20  CREATININE 0.80  CALCIUM  8.8*  AST 24  ALT 14  ALKPHOS 70  BILITOT 0.6   ------------------------------------------------------------------------------------------------------------------  Cardiac Enzymes Recent Labs  Lab 06/26/17 1236  TROPONINI <0.03   ------------------------------------------------------------------------------------------------------------------  RADIOLOGY:  Dg Chest 1 View  Result Date: 06/26/2017 CLINICAL DATA:  Elevated heart rate.  Atrial fibrillation. EXAM: CHEST 1 VIEW COMPARISON:  05/15/2015 FINDINGS: Numerous leads and wires project over the chest. Midline trachea. Normal heart size. Atherosclerosis in the transverse aorta. No pleural effusion or pneumothorax. Clear lungs. No congestive failure. IMPRESSION: No acute cardiopulmonary disease. Aortic Atherosclerosis (ICD10-I70.0). Electronically Signed   By: Abigail Miyamoto  M.D.   On: 06/26/2017 13:21   Ct Renal Stone Study  Result Date: 06/26/2017 CLINICAL DATA:  Microscopic hematuria in a patient with left flank and hip pain. EXAM: CT ABDOMEN AND PELVIS WITHOUT CONTRAST TECHNIQUE: Multidetector CT imaging of the abdomen and pelvis was performed following the standard protocol without IV contrast. COMPARISON:  CT abdomen and pelvis 06/08/2015. FINDINGS: Lower chest: Lung bases are clear. No pleural or pericardial effusion. Hepatobiliary: No focal liver abnormality is seen. No gallstones, gallbladder wall thickening, or biliary dilatation. Pancreas: Unremarkable. No pancreatic ductal dilatation or surrounding inflammatory changes. Spleen: Normal in size without focal abnormality. Adrenals/Urinary Tract: Nonobstructing stone lower pole right kidney measures 0.3 cm. Punctate nonobstructing stone is seen in the lower pole of the left kidney. No hydronephrosis on the right or left and no ureteral or urinary bladder stones. Urinary bladder is unremarkable. The adrenal glands appear normal. Stomach/Bowel: Diverticulosis appears worst in the sigmoid. No diverticulitis. The appendix has been removed. The stomach and small bowel are unremarkable. Vascular/Lymphatic: Aortic atherosclerosis. No enlarged abdominal or pelvic lymph nodes. Reproductive: Uterus and bilateral adnexa are unremarkable. Other: No hernia.  No fluid collection. Musculoskeletal: No lytic or sclerotic lesion. Severe convex right scoliosis with the apex at approximately L1 is noted. The patient has multilevel thoracic and lumbar degenerative change. IMPRESSION: No acute abnormality. Single small nonobstructing bilateral renal stones. Diverticulosis without diverticulitis. Atherosclerosis. Marked scoliosis and multilevel degenerative disc disease. Electronically Signed   By: Inge Rise M.D.   On: 06/26/2017 14:15    EKG:   Rapid A. fib with RVR IMPRESSION AND PLAN:  Shelly Ponce  is a 84 y.o. female with a known  history of UTI in the past, acid reflux, high blood pressure comes to the emergency room after she went to her primary care for checkup and was discovered to have a rapid heart rate.  Patient was found to be in A. fib on arrival 160 heart rate.  She has no history of this denies any palpitations.   1.  Rapid A. fib with RVR--new onset -Admit to telemetry -IV Cardizem drip -Continue IV heparin drip -Echo of the heart, cardiology consultation with [facility] cardiology -Defer long-term anticoagulation to cardiology  2.  UTI -IV Rocephin -Follow-up urine culture blood culture  3.  GERD continue PPI  4.  DVT prophylaxis already on heparin drip    All the records are reviewed and case discussed with ED provider. Management plans discussed with the patient, family and they are in agreement.  CODE STATUS: Full  TOTAL  Critical TIME TAKING CARE OF THIS PATIENT: 50 minutes.    Fritzi Mandes M.D on 06/26/2017 at 2:58 PM  Between 7am to 6pm - Pager - [phone]  After 6pm go to [email or web address] - password [email or web address]  [facility]  Office  [phone]  CC: Primary care physician; Adin Hector, MD

## 2017-06-27 ENCOUNTER — Inpatient Hospital Stay
Admit: 2017-06-27 | Discharge: 2017-06-27 | Disposition: A | Discharge status: Home / Self Care | Payer: Medicare Other | Attending: Internal Medicine | Admitting: Internal Medicine

## 2017-06-27 DIAGNOSIS — E43 Unspecified severe protein-calorie malnutrition: Secondary | ICD-10-CM

## 2017-06-27 LAB — ECHOCARDIOGRAM COMPLETE
Height: 61 in
Weight: 1472 oz

## 2017-06-27 LAB — HEPARIN LEVEL (UNFRACTIONATED)
HEPARIN UNFRACTIONATED: 0.33 [IU]/mL (ref 0.30–0.70)
Heparin Unfractionated: 0.29 IU/mL — ABNORMAL LOW (ref 0.30–0.70)

## 2017-06-27 LAB — CBC
HEMATOCRIT: 36.5 % (ref 35.0–47.0)
Hemoglobin: 12 g/dL (ref 12.0–16.0)
MCH: 31.6 pg (ref 26.0–34.0)
MCHC: 33 g/dL (ref 32.0–36.0)
MCV: 96 fL (ref 80.0–100.0)
Platelets: 238 10*3/uL (ref 150–440)
RBC: 3.8 MIL/uL (ref 3.80–5.20)
RDW: 15 % — ABNORMAL HIGH (ref 11.5–14.5)
WBC: 6.3 10*3/uL (ref 3.6–11.0)

## 2017-06-27 LAB — MRSA PCR SCREENING: MRSA BY PCR: NEGATIVE

## 2017-06-27 MED ORDER — CEPHALEXIN 250 MG PO CAPS: 250.0000 mg | ORAL_CAPSULE | Freq: Two times a day (BID) | ORAL | 0 refills | Status: AC

## 2017-06-27 MED ORDER — DILTIAZEM HCL ER COATED BEADS 120 MG PO CP24
240.0000 mg | ORAL_CAPSULE | Freq: Every day | ORAL | Status: DC
  Administered 2017-06-27: 240 mg via ORAL
  Filled 2017-06-27: qty 2

## 2017-06-27 MED ORDER — APIXABAN 2.5 MG PO TABS: 2.5000 mg | ORAL_TABLET | Freq: Two times a day (BID) | ORAL | 0 refills | Status: AC

## 2017-06-27 MED ORDER — HEPARIN BOLUS VIA INFUSION
600.0000 [IU] | Freq: Once | INTRAVENOUS | Status: AC
  Administered 2017-06-27: 600 [IU] via INTRAVENOUS
  Filled 2017-06-27: qty 600

## 2017-06-27 MED ORDER — DILTIAZEM HCL ER COATED BEADS 240 MG PO CP24: 240.0000 mg | ORAL_CAPSULE | Freq: Every day | ORAL | 0 refills | Status: DC

## 2017-06-27 MED ORDER — APIXABAN 2.5 MG PO TABS: 2.5000 mg | ORAL_TABLET | Freq: Two times a day (BID) | ORAL | 0 refills | Status: DC

## 2017-06-27 MED ORDER — APIXABAN 2.5 MG PO TABS
2.5000 mg | ORAL_TABLET | Freq: Two times a day (BID) | ORAL | Status: DC
  Administered 2017-06-27: 2.5 mg via ORAL
  Filled 2017-06-27: qty 1

## 2017-06-27 NOTE — Progress Notes (Signed)
*  PRELIMINARY RESULTS* Echocardiogram 2D Echocardiogram has been performed.  Sherrie Sport 06/27/2017, 2:49 PM

## 2017-06-27 NOTE — Discharge Summary (Signed)
[facility] [place] NAME: Shelly Ponce    MR#:  [id number]  DATE OF BIRTH:  Jul 31, 1932  DATE OF ADMISSION:  06/26/2017 ADMITTING PHYSICIAN: Fritzi Mandes, MD  DATE OF DISCHARGE: 06/27/2017  PRIMARY CARE PHYSICIAN: Tama High III, MD    ADMISSION DIAGNOSIS:  Atrial fibrillation with rapid ventricular response ([place]) [I48.91] Urinary tract infection with hematuria, site unspecified [N39.0, R31.9] A-fib ([place]) [I48.91]  DISCHARGE DIAGNOSIS:  Active Problems:   A-fib (HCC)   Protein-calorie malnutrition, severe   SECONDARY DIAGNOSIS:   Past Medical History:  Diagnosis Date  . Absolute anemia 05/26/2015  . Acid reflux 05/26/2015  . Arthritis   . BP (high blood pressure) 05/26/2015  . CA of skin 05/26/2015  . Chronic constipation 05/26/2015  . Cyst and pseudocyst of pancreas 06/02/2013  . Edema leg 05/26/2015   Overview:  progressive   . Risk for falls   . Scoliosis   . Sepsis ([place]) 05/15/2015    HOSPITAL COURSE:   Shelly Ponce  is a 84 y.o. female with a known history of UTI in the past, acid reflux, high blood pressure comes to the emergency room after she went to her primary care for checkup and was discovered to have a rapid heart rate.  Patient was found to be in A. fib on arrival 160 heart rate.  She has no history of this denies any palpitations.   1.  Rapid A. fib with RVR--new onset -Admitted to telemetry -IV Cardizem drip -Continue IV heparin drip -Echo of the heart, cardiology consultation with [facility] cardiology - Converted to NSR and stable on oral cardizem. - cardio suggested Eliquis and follow in 1-2 weeks,..  2.  UTI -IV Rocephin -Follow-up urine culture blood culture - Oral keflex on d/c.  3.  GERD continue PPI  4.  DVT prophylaxis already on heparin drip    DISCHARGE CONDITIONS:   Stable.  CONSULTS OBTAINED:  Treatment Team:  Yolonda Kida, MD Corey Skains, MD  DRUG ALLERGIES:    Allergies  Allergen Reactions  . Macrobid [Nitrofurantoin] Rash  . Sulfa Antibiotics Rash    DISCHARGE MEDICATIONS:   Allergies as of 06/27/2017      Reactions   Macrobid [nitrofurantoin] Rash   Sulfa Antibiotics Rash      Medication List    TAKE these medications   acetaminophen 325 MG tablet Commonly known as:  TYLENOL Take 650 mg by mouth every 4 (four) hours as needed.   apixaban 2.5 MG Tabs tablet Commonly known as:  ELIQUIS Take 1 tablet (2.5 mg total) by mouth 2 (two) times daily.   aspirin EC 81 MG tablet Take 81 mg by mouth daily.   cephALEXin 250 MG capsule Commonly known as:  KEFLEX Take 1 capsule (250 mg total) by mouth 2 (two) times daily for 3 days.   diltiazem 240 MG 24 hr capsule Commonly known as:  CARDIZEM CD Take 1 capsule (240 mg total) by mouth daily. Start taking on:  06/28/2017   estradiol 0.5 MG tablet Commonly known as:  ESTRACE Take 0.5 mg by mouth daily.   HYDROcodone-acetaminophen 5-325 MG tablet Commonly known as:  NORCO/VICODIN Take 1 tablet by mouth every 6 (six) hours as needed for moderate pain. What changed:  when to take this   loperamide 2 MG capsule Commonly known as:  IMODIUM Take 2 mg by mouth as needed for diarrhea or loose stools.   Melatonin 10 MG Tabs Take  10 mg by mouth at bedtime as needed.   PRESERVISION AREDS PO Take 1 capsule by mouth 2 (two) times daily.   multivitamin-iron-minerals-folic acid Tabs tablet Take 1 tablet by mouth daily.   progesterone 100 MG capsule Commonly known as:  PROMETRIUM Take 100 mg by mouth daily. 15 DAYS PER MONTH   promethazine 25 MG suppository Commonly known as:  PHENERGAN Place 25 mg rectally every 8 (eight) hours as needed for nausea or vomiting.   senna-docusate 8.6-50 MG tablet Commonly known as:  Senokot-S Take 2 tablets by mouth at bedtime as needed for mild constipation.   traZODone 50 MG tablet Commonly known as:  DESYREL Take 50 mg by mouth at bedtime.    vitamin E 400 UNIT capsule Take 400 Units by mouth daily.        DISCHARGE INSTRUCTIONS:    Follow with Cardio clinci in 1-2 weeks.  If you experience worsening of your admission symptoms, develop shortness of breath, life threatening emergency, suicidal or homicidal thoughts you must seek medical attention immediately by calling 911 or calling your MD immediately  if symptoms less severe.  You Must read complete instructions/literature along with all the possible adverse reactions/side effects for all the Medicines you take and that have been prescribed to you. Take any new Medicines after you have completely understood and accept all the possible adverse reactions/side effects.   Please note  You were cared for by a hospitalist during your hospital stay. If you have any questions about your discharge medications or the care you received while you were in the hospital after you are discharged, you can call the unit and asked to speak with the hospitalist on call if the hospitalist that took care of you is not available. Once you are discharged, your primary care physician will handle any further medical issues. Please note that NO REFILLS for any discharge medications will be authorized once you are discharged, as it is imperative that you return to your primary care physician (or establish a relationship with a primary care physician if you do not have one) for your aftercare needs so that they can reassess your need for medications and monitor your lab values.    Today   CHIEF COMPLAINT:   Chief Complaint  Patient presents with  . Tachycardia    HISTORY OF PRESENT ILLNESS:  Shelly Ponce  is a 84 y.o. female with a known history of UTI in the past, acid reflux, high blood pressure comes to the emergency room after she went to her primary care for checkup and was discovered to have a rapid heart rate.  Patient was found to be in A. fib on arrival 160 heart rate.  She has no history  of this denies any palpitations.  She denies any fever chills vomiting or diarrhea.  She was also found to have UTI.  Patient is being admitted with rapid A. fib in the setting of UTI. Currently on diltiazem 10 mg/h and IV heparin drip has been ordered.     VITAL SIGNS:  Blood pressure (!) 126/55, pulse 76, temperature 98 F (36.7 C), temperature source Oral, resp. rate 14, height 5\' 1"  (1.549 m), weight 41.7 kg (92 lb), SpO2 99 %.  I/O:    Intake/Output Summary (Last 24 hours) at 06/27/2017 1544 Last data filed at 06/27/2017 1421 Gross per 24 hour  Intake 480 ml  Output 1050 ml  Net -570 ml    PHYSICAL EXAMINATION:  GENERAL:  84 y.o.-year-old patient  lying in the bed with no acute distress.  EYES: Pupils equal, round, reactive to light and accommodation. No scleral icterus. Extraocular muscles intact.  HEENT: Head atraumatic, normocephalic. Oropharynx and nasopharynx clear.  NECK:  Supple, no jugular venous distention. No thyroid enlargement, no tenderness.  LUNGS: Normal breath sounds bilaterally, no wheezing, rales,rhonchi or crepitation. No use of accessory muscles of respiration.  CARDIOVASCULAR: S1, S2 normal. No murmurs, rubs, or gallops.  ABDOMEN: Soft, non-tender, non-distended. Bowel sounds present. No organomegaly or mass.  EXTREMITIES: No pedal edema, cyanosis, or clubbing.  NEUROLOGIC: Cranial nerves II through XII are intact. Muscle strength 5/5 in all extremities. Sensation intact. Gait not checked.  PSYCHIATRIC: The patient is alert and oriented x 3.  SKIN: No obvious rash, lesion, or ulcer.   DATA REVIEW:   CBC Recent Labs  Lab 06/27/17 0451  WBC 6.3  HGB 12.0  HCT 36.5  PLT 238    Chemistries  Recent Labs  Lab 06/26/17 1236  NA 140  K 4.4  CL 107  CO2 25  GLUCOSE 93  BUN 20  CREATININE 0.80  CALCIUM 8.8*  MG 2.1  AST 24  ALT 14  ALKPHOS 70  BILITOT 0.6    Cardiac Enzymes Recent Labs  Lab 06/26/17 1236  TROPONINI <0.03     Microbiology Results  Results for orders placed or performed during the hospital encounter of 06/26/17  MRSA PCR Screening     Status: None   Collection Time: 06/26/17 11:45 PM  Result Value Ref Range Status   MRSA by PCR NEGATIVE NEGATIVE Final    Comment:        The GeneXpert MRSA Assay (FDA approved for NASAL specimens only), is one component of a comprehensive MRSA colonization surveillance program. It is not intended to diagnose MRSA infection nor to guide or monitor treatment for MRSA infections. Performed at [facility], [address], [place], [place] [address]     RADIOLOGY:  Dg Chest 1 View  Result Date: 06/26/2017 CLINICAL DATA:  Elevated heart rate.  Atrial fibrillation. EXAM: CHEST 1 VIEW COMPARISON:  05/15/2015 FINDINGS: Numerous leads and wires project over the chest. Midline trachea. Normal heart size. Atherosclerosis in the transverse aorta. No pleural effusion or pneumothorax. Clear lungs. No congestive failure. IMPRESSION: No acute cardiopulmonary disease. Aortic Atherosclerosis (ICD10-I70.0). Electronically Signed   By: Abigail Miyamoto M.D.   On: 06/26/2017 13:21   Ct Renal Stone Study  Result Date: 06/26/2017 CLINICAL DATA:  Microscopic hematuria in a patient with left flank and hip pain. EXAM: CT ABDOMEN AND PELVIS WITHOUT CONTRAST TECHNIQUE: Multidetector CT imaging of the abdomen and pelvis was performed following the standard protocol without IV contrast. COMPARISON:  CT abdomen and pelvis 06/08/2015. FINDINGS: Lower chest: Lung bases are clear. No pleural or pericardial effusion. Hepatobiliary: No focal liver abnormality is seen. No gallstones, gallbladder wall thickening, or biliary dilatation. Pancreas: Unremarkable. No pancreatic ductal dilatation or surrounding inflammatory changes. Spleen: Normal in size without focal abnormality. Adrenals/Urinary Tract: Nonobstructing stone lower pole right kidney measures 0.3 cm. Punctate nonobstructing stone  is seen in the lower pole of the left kidney. No hydronephrosis on the right or left and no ureteral or urinary bladder stones. Urinary bladder is unremarkable. The adrenal glands appear normal. Stomach/Bowel: Diverticulosis appears worst in the sigmoid. No diverticulitis. The appendix has been removed. The stomach and small bowel are unremarkable. Vascular/Lymphatic: Aortic atherosclerosis. No enlarged abdominal or pelvic lymph nodes. Reproductive: Uterus and bilateral adnexa are unremarkable. Other: No  hernia.  No fluid collection. Musculoskeletal: No lytic or sclerotic lesion. Severe convex right scoliosis with the apex at approximately L1 is noted. The patient has multilevel thoracic and lumbar degenerative change. IMPRESSION: No acute abnormality. Single small nonobstructing bilateral renal stones. Diverticulosis without diverticulitis. Atherosclerosis. Marked scoliosis and multilevel degenerative disc disease. Electronically Signed   By: Inge Rise M.D.   On: 06/26/2017 14:15    EKG:   Orders placed or performed during the hospital encounter of 06/26/17  . EKG 12-Lead  . EKG 12-Lead  . ED EKG within 10 minutes  . ED EKG within 10 minutes  . EKG 12-Lead  . EKG 12-Lead  . ED EKG  . ED EKG      Management plans discussed with the patient, family and they are in agreement.  CODE STATUS:     Code Status Orders  (From admission, onward)        Start     Ordered   06/26/17 1608  Do not attempt resuscitation (DNR)  Continuous    Question Answer Comment  In the event of cardiac or respiratory ARREST Do not call a "code blue"   In the event of cardiac or respiratory ARREST Do not perform Intubation, CPR, defibrillation or ACLS   In the event of cardiac or respiratory ARREST Use medication by any route, position, wound care, and other measures to relive pain and suffering. May use oxygen, suction and manual treatment of airway obstruction as needed for comfort.      06/26/17 1607     Code Status History    Date Active Date Inactive Code Status Order ID Comments User Context   05/15/2015 19:40 05/23/2015 18:36 DNR [id number]  [name], MD Inpatient    Advance Directive Documentation     Most Recent Value  Type of Advance Directive  Healthcare Power of Attorney, Living will  Pre-existing out of facility DNR order (yellow form or pink MOST form)  No data  "MOST" Form in Place?  No data      TOTAL TIME TAKING CARE OF THIS PATIENT: 35 minutes.    [name] M.D on 06/27/2017 at 3:44 PM  Between 7am to 6pm - Pager - [phone]  After 6pm go to [email or web address] - password [email or web address] [facility]  Office  [phone]  CC: Primary care physician; Adin Hector, MD   Note: This dictation was prepared with Dragon dictation along with smaller phrase technology. Any transcriptional errors that result from this process are unintentional.

## 2017-06-27 NOTE — Progress Notes (Addendum)
Margarita Rana to be D/C'd home- Assisted Living per MD order. Patient given discharge teaching and paperwork regarding medications, diet, follow-up appointments and activity. Patient understanding verbalized. No questions or complaints at this time. Skin condition as charted. IV and telemetry removed prior to leaving.  No further needs by Care Management/Social Work. Prescriptions sent to pharmacy. Eliquis prescription filled prior to discharge.   An After Visit Summary was printed and given to the patient. Caregiver/family present during discharge teaching.   Report called to [facility] at [facility]. Family/Friend taking patient.    Terrilyn Saver

## 2017-06-27 NOTE — Care Management (Signed)
Patient to discharge back to [facility] on new Eliquis.  Provided [facility] pharmacist with 30 day trial coupon who will have it filled by [place] so can be sent with patient as [facility] will not have the mediation on hand until tomorrow

## 2017-06-27 NOTE — Progress Notes (Signed)
Initial Nutrition Assessment  DOCUMENTATION CODES:   Severe malnutrition in context of chronic illness  INTERVENTION:   Magic cup TID with meals, each supplement provides 290 kcal and 9 grams of protein  MVI  Liberalize diet   NUTRITION DIAGNOSIS:   Severe Malnutrition related to chronic illness(heart disease, advanced age ) as evidenced by severe fat depletion, severe muscle depletion.  GOAL:   Patient will meet greater than or equal to 90% of their needs  MONITOR:   PO intake, Supplement acceptance, Labs, Weight trends, Skin, I & O's  REASON FOR ASSESSMENT:   Other (Comment)(Low BMI)    ASSESSMENT:   84 y.o. female with a known history of UTI in the past, acid reflux, high blood pressure comes to the emergency room after she went to her primary care for checkup and was discovered to have a rapid heart rate.  Patient was found to be in A.   Met with pt in room today. Pt reports chronic poor appetite and oral intake pta. Pt reports a decrease in her appetite with age and states that she mostly just grazes throughout the day. Pt does not like supplements and refuses them while hospitalized. Per chart, pt is weight stable. RD will add Magic Cups to pt's meal trays. Pt already ordered for MVI. Per RN, pt to possibly discharge today.   Medications reviewed and include: aspirin, hydrocodone, MVI, ocuvite, vitamin E  Labs reviewed:   Nutrition-Focused physical exam completed. Findings are severe fat and muscle depletions over entire body, and no edema.   Diet Order:  Diet Heart Room service appropriate? Yes; Fluid consistency: Thin  EDUCATION NEEDS:   Education needs have been addressed  Skin: Reviewed RN Assessment  Last BM:  1/8- TYPE 1  Height:   Ht Readings from Last 1 Encounters:  06/26/17 _0  (1.549 m)    Weight:   Wt Readings from Last 1 Encounters:  06/26/17 92 lb (41.7 kg)    Ideal Body Weight:  47.7 kg  BMI:  Body mass index is 17.38  kg/m.  Estimated Nutritional Needs:   Kcal:  1200-1300kcal/day   Protein:  63-71g/day   Fluid:  >1.2L/day   Koleen Distance MS, RD, LDN Pager #[phone] After Hours Pager: [phone]

## 2017-06-27 NOTE — Discharge Instructions (Addendum)

## 2017-06-27 NOTE — Progress Notes (Signed)
Dr. Nehemiah Massed aware of patient's heart rate 110-120s. Instructed to proceed with orders and begin weaning drip once PO dose is given with morning meds. Will continue to monitor heart rate.

## 2017-06-27 NOTE — Progress Notes (Signed)
[facility] Cardiology [facility] Encounter Note  Patient: Shelly Ponce / Admit Date: 06/26/2017 / Date of Encounter: 06/27/2017, 8:03 AM   Subjective: Patient feels well this morning.  No evidence of chest pain congestive heart failure or other significant concerns.  Heart rate slightly improved with diltiazem intravenously and orally.  Tolerating heparin well  Review of Systems: Positive for: None Negative for: Vision change, hearing change, syncope, dizziness, nausea, vomiting,diarrhea, bloody stool, stomach pain, cough, congestion, diaphoresis, urinary frequency, urinary pain,skin lesions, skin rashes Others previously listed  Objective: Telemetry: Atrial fibrillation with rapid ventricular rate Physical Exam: Blood pressure (!) 103/47, pulse 84, temperature 98.3 F (36.8 C), temperature source Oral, resp. rate 18, height 5\' 1"  (1.549 m), weight 41.7 kg (92 lb), SpO2 97 %. Body mass index is 17.38 kg/m. General: Well developed, well nourished, in no acute distress. Head: Normocephalic, atraumatic, sclera non-icteric, no xanthomas, nares are without discharge. Neck: No apparent masses Lungs: Normal respirations with no wheezes, no rhonchi, no rales , no crackles   Heart: irregular rate and rhythm, normal S1 S2, no murmur, no rub, no gallop, PMI is normal size and placement, carotid upstroke normal without bruit, jugular venous pressure normal Abdomen: Soft, non-tender, non-distended with normoactive bowel sounds. No hepatosplenomegaly. Abdominal aorta is normal size without bruit Extremities: No edema, no clubbing, no cyanosis, no ulcers,  Peripheral: 2+ radial, 2+ femoral, 2+ dorsal pedal pulses Neuro: Alert and oriented. Moves all extremities spontaneously. Psych:  Responds to questions appropriately with a normal affect.   Intake/Output Summary (Last 24 hours) at 06/27/2017 0803 Last data filed at 06/27/2017 0322 Gross per 24 hour  Intake 120 ml  Output 1050 ml  Net -930 ml     Inpatient Medications:  . aspirin EC  81 mg Oral Daily  . diltiazem  240 mg Oral Daily  . estradiol  0.5 mg Oral Daily  . HYDROcodone-acetaminophen  1 tablet Oral BID  . multivitamin with minerals  1 tablet Oral Daily  . multivitamin-lutein  1 capsule Oral BID  . vitamin E  400 Units Oral Daily   Infusions:  . small volume/piggyback builder    . heparin 700 Units/hr (06/27/17 0639)    Labs: Recent Labs    06/26/17 1236  NA 140  K 4.4  CL 107  CO2 25  GLUCOSE 93  BUN 20  CREATININE 0.80  CALCIUM 8.8*  MG 2.1   Recent Labs    06/26/17 1236  AST 24  ALT 14  ALKPHOS 70  BILITOT 0.6  PROT 6.2*  ALBUMIN 3.8   Recent Labs    06/26/17 1236 06/27/17 0451  WBC 6.6 6.3  HGB 13.1 12.0  HCT 38.8 36.5  MCV 95.7 96.0  PLT 222 238   Recent Labs    06/26/17 1236  TROPONINI <0.03   Invalid input(s): POCBNP No results for input(s): HGBA1C in the last 72 hours.   Weights: Filed Weights   06/26/17 1222  Weight: 41.7 kg (92 lb)     Radiology/Studies:  Dg Chest 1 View  Result Date: 06/26/2017 CLINICAL DATA:  Elevated heart rate.  Atrial fibrillation. EXAM: CHEST 1 VIEW COMPARISON:  05/15/2015 FINDINGS: Numerous leads and wires project over the chest. Midline trachea. Normal heart size. Atherosclerosis in the transverse aorta. No pleural effusion or pneumothorax. Clear lungs. No congestive failure. IMPRESSION: No acute cardiopulmonary disease. Aortic Atherosclerosis (ICD10-I70.0). Electronically Signed   By: Abigail Miyamoto M.D.   On: 06/26/2017 13:21   Ct Renal Stone Study  Result Date: 06/26/2017 CLINICAL DATA:  Microscopic hematuria in a patient with left flank and hip pain. EXAM: CT ABDOMEN AND PELVIS WITHOUT CONTRAST TECHNIQUE: Multidetector CT imaging of the abdomen and pelvis was performed following the standard protocol without IV contrast. COMPARISON:  CT abdomen and pelvis 06/08/2015. FINDINGS: Lower chest: Lung bases are clear. No pleural or pericardial  effusion. Hepatobiliary: No focal liver abnormality is seen. No gallstones, gallbladder wall thickening, or biliary dilatation. Pancreas: Unremarkable. No pancreatic ductal dilatation or surrounding inflammatory changes. Spleen: Normal in size without focal abnormality. Adrenals/Urinary Tract: Nonobstructing stone lower pole right kidney measures 0.3 cm. Punctate nonobstructing stone is seen in the lower pole of the left kidney. No hydronephrosis on the right or left and no ureteral or urinary bladder stones. Urinary bladder is unremarkable. The adrenal glands appear normal. Stomach/Bowel: Diverticulosis appears worst in the sigmoid. No diverticulitis. The appendix has been removed. The stomach and small bowel are unremarkable. Vascular/Lymphatic: Aortic atherosclerosis. No enlarged abdominal or pelvic lymph nodes. Reproductive: Uterus and bilateral adnexa are unremarkable. Other: No hernia.  No fluid collection. Musculoskeletal: No lytic or sclerotic lesion. Severe convex right scoliosis with the apex at approximately L1 is noted. The patient has multilevel thoracic and lumbar degenerative change. IMPRESSION: No acute abnormality. Single small nonobstructing bilateral renal stones. Diverticulosis without diverticulitis. Atherosclerosis. Marked scoliosis and multilevel degenerative disc disease. Electronically Signed   By: Inge Rise M.D.   On: 06/26/2017 14:15     Assessment and Recommendation  84 y.o. female with borderline hypertension having new onset atrial fibrillation with rapid ventricular rate and no evidence of congestive heart failure and/or anginal equivalent. 1.  Continue diltiazem orally and increase to 240 mg. 2.  Discontinuation of diltiazem drip 3.  Possible use of low-dose beta-blocker in combination of diltiazem for better heart rate control with a goal heart rate between 60 and 100 bpm 4.  Continue anticoagulation and use Eliquis 2.5 mg twice per day for further risk reduction and  stroke 5.  Continue supportive care and begin ambulation and follow for further need in discharged home today with follow-up in 2-3 days for adjustments of medication management  Signed, Serafina Royals M.D. FACC

## 2017-06-27 NOTE — Clinical Social Work Note (Signed)
Patient to be d/c'ed today to [facility].  Patient and family agreeable to plans will transport via family friend RN to call report [phone].  Evette Cristal, MSW, [place]

## 2017-06-27 NOTE — Clinical Social Work Note (Signed)
Clinical Social Work Assessment  Patient Details  Name: Shelly Ponce MRN: [id number] Date of Birth: Aug 09, 1932  Date of referral:  06/27/17               Reason for consult:  Facility Placement                Permission sought to share information with:  Family Supports, Customer service manager Permission granted to share information::  Yes, Verbal Permission Granted  Name::     Riddle,Shelly Ponce [phone]  [phone]   Agency::  ALF Admissions  Relationship::     Contact Information:     Housing/Transportation Living arrangements for the past 2 months:  Assisted Living Facility([facility]) Source of Information:  Patient, Medical Team Patient Interpreter Needed:  None Criminal Activity/Legal Involvement Pertinent to Current Situation/Hospitalization:  No - Comment as needed Significant Relationships:  Friend Lives with:  Facility Resident Do you feel safe going back to the place where you live?  Yes Need for family participation in patient care:  No (Coment)  Care giving concerns:  Patient did not express any concerns about returning back to [place].   Social Worker assessment / plan:  Patient is an 84 year old female who is a resident at [facility].  Patient did not express any concerns about returning back to ALF.  Patient is satisfied with the care that is being provided.  Patient did not have any questions or concerns to report to CSW.  Patient to return back to [place] today.  Employment status:  Retired Nurse, adult PT Recommendations:  Not assessed at this time Information / Referral to community resources:     Patient/Family's Response to care:  Patient agreeable to returning back to ALF.  Patient/Family's Understanding of and Emotional Response to Diagnosis, Current Treatment, and Prognosis:  Patient looking forward to returning back to [place].  Emotional Assessment Appearance:  Appears  stated age Attitude/Demeanor/Rapport:    Affect (typically observed):  Calm, Appropriate Orientation:  Oriented to Self, Oriented to Place, Oriented to Situation, Oriented to  Time Alcohol / Substance use:  Not Applicable Psych involvement (Current and /or in the community):  No (Comment)  Discharge Needs  Concerns to be addressed:  Care Coordination Readmission within the last 30 days:  No Current discharge risk:  None Barriers to Discharge:  No Barriers Identified   Shelly Ponce, LCSWA 06/27/2017, 4:18 PM

## 2017-06-27 NOTE — Care Management Obs Status (Signed)
[place] NOTIFICATION   Patient Details  Name: Shelly Ponce MRN: [id number] Date of Birth: 01/03/1933   Medicare Observation Status Notification Given:  Yes Notice signed, one given to patient and the other to HIM for scanning    Katrina Stack, RN 06/27/2017, 3:42 PM

## 2017-06-27 NOTE — NC FL2 (Signed)
[place] LEVEL OF CARE SCREENING TOOL     IDENTIFICATION  Patient Name: Shelly Ponce Birthdate: 1933/04/05 Sex: female Admission Date (Current Location): 06/26/2017  [place] and [place] Number:  Engineering geologist and Address:  [facility], [address], [place], [place] [address]      Provider Number: [id number]  Attending Physician Name and Address:  [name], *  Relative Name and Phone Number:  Riddle,Shelly Ponce [phone]  [phone]     Current Level of Care: Hospital Recommended Level of Care: [place] Prior Approval Number:    Date Approved/Denied:   PASRR Number:    Discharge Plan: Domiciliary (Rest home)    Current Diagnoses: Patient Active Problem List   Diagnosis Date Noted  . Protein-calorie malnutrition, severe 06/27/2017  . A-fib ([place]) 06/26/2017  . Absolute anemia 05/26/2015  . Chronic constipation 05/26/2015  . Acid reflux 05/26/2015  . BP (high blood pressure) 05/26/2015  . Edema leg 05/26/2015  . OP (osteoporosis) 05/26/2015  . Dupuytren's contracture of foot 05/26/2015  . CA of skin 05/26/2015  . Abdominal pain   . Sepsis ([place]) 05/15/2015  . UTI (lower urinary tract infection) 05/15/2015  . Bursitis, trochanteric 01/22/2015  . Degeneration of intervertebral disc of lumbar region 01/23/2014  . Neuritis or radiculitis due to rupture of lumbar intervertebral disc 01/22/2014  . Cyst and pseudocyst of pancreas 06/02/2013    Orientation RESPIRATION BLADDER Height & Weight     Time, Situation, Place, Self  Normal Continent Weight: 92 lb (41.7 kg) Height:  5\' 1"  (154.9 cm)  BEHAVIORAL SYMPTOMS/MOOD NEUROLOGICAL BOWEL NUTRITION STATUS      Continent Diet  AMBULATORY STATUS COMMUNICATION OF NEEDS Skin   Supervision Verbally Normal                       Personal Care Assistance Level of Assistance  Bathing, Feeding, Dressing Bathing Assistance: Limited  assistance Feeding assistance: Independent Dressing Assistance: Limited assistance     Functional Limitations Info  Sight, Hearing, Speech Sight Info: Adequate Hearing Info: Adequate Speech Info: Adequate    SPECIAL CARE FACTORS FREQUENCY                       Contractures      Additional Factors Info  Code Status, Allergies Code Status Info: DNR Allergies Info: MACROBID NITROFURANTOIN, SULFA ANTIBIOTICS            Current Medications (06/27/2017):  This is the current hospital active medication list Current Facility-Administered Medications  Medication Dose Route Frequency Provider Last Rate Last Dose  . acetaminophen (TYLENOL) tablet 650 mg  650 mg Oral Q6H PRN Fritzi Mandes, MD   650 mg at 06/27/17 1132  . apixaban (ELIQUIS) tablet 2.5 mg  2.5 mg Oral BID [name], MD   2.5 mg at 06/27/17 1151  . aspirin EC tablet 81 mg  81 mg Oral Daily Fritzi Mandes, MD   81 mg at 06/27/17 0856  . cefTRIAXone (ROCEPHIN) 1 g in dextrose 5 % 50 mL injection   Intravenous Q24H Fritzi Mandes, MD      . diltiazem (CARDIZEM CD) 24 hr capsule 240 mg  240 mg Oral Daily Corey Skains, MD   240 mg at 06/27/17 0856  . estradiol (ESTRACE) tablet 0.5 mg  0.5 mg Oral Daily Fritzi Mandes, MD   0.5 mg at 06/27/17 0857  . HYDROcodone-acetaminophen (NORCO/VICODIN) 5-325 MG per tablet 1  tablet  1 tablet Oral BID Fritzi Mandes, MD   1 tablet at 06/27/17 0857  . Melatonin TABS 10 mg  10 mg Oral QHS PRN Fritzi Mandes, MD   10 mg at 06/26/17 2054  . multivitamin with minerals tablet 1 tablet  1 tablet Oral Daily Fritzi Mandes, MD   1 tablet at 06/27/17 0856  . multivitamin-lutein (OCUVITE-LUTEIN) capsule 1 capsule  1 capsule Oral BID Fritzi Mandes, MD   1 capsule at 06/27/17 0857  . vitamin E capsule 400 Units  400 Units Oral Daily Fritzi Mandes, MD   400 Units at 06/27/17 0856  . zolpidem (AMBIEN) tablet 5 mg  5 mg Oral QHS PRN Amelia Jo, MD   5 mg at 06/26/17 2334     Discharge Medications:   TAKE these medications   acetaminophen 325 MG tablet Commonly known as:  TYLENOL Take 650 mg by mouth every 4 (four) hours as needed.   apixaban 2.5 MG Tabs tablet Commonly known as:  ELIQUIS Take 1 tablet (2.5 mg total) by mouth 2 (two) times daily.   aspirin EC 81 MG tablet Take 81 mg by mouth daily.   cephALEXin 250 MG capsule Commonly known as:  KEFLEX Take 1 capsule (250 mg total) by mouth 2 (two) times daily for 3 days.   diltiazem 240 MG 24 hr capsule Commonly known as:  CARDIZEM CD Take 1 capsule (240 mg total) by mouth daily. Start taking on:  06/28/2017   estradiol 0.5 MG tablet Commonly known as:  ESTRACE Take 0.5 mg by mouth daily.   HYDROcodone-acetaminophen 5-325 MG tablet Commonly known as:  NORCO/VICODIN Take 1 tablet by mouth every 6 (six) hours as needed for moderate pain. What changed:  when to take this   loperamide 2 MG capsule Commonly known as:  IMODIUM Take 2 mg by mouth as needed for diarrhea or loose stools.   Melatonin 10 MG Tabs Take 10 mg by mouth at bedtime as needed.   PRESERVISION AREDS PO Take 1 capsule by mouth 2 (two) times daily.   multivitamin-iron-minerals-folic acid Tabs tablet Take 1 tablet by mouth daily.   progesterone 100 MG capsule Commonly known as:  PROMETRIUM Take 100 mg by mouth daily. 15 DAYS PER MONTH   promethazine 25 MG suppository Commonly known as:  PHENERGAN Place 25 mg rectally every 8 (eight) hours as needed for nausea or vomiting.   senna-docusate 8.6-50 MG tablet Commonly known as:  Senokot-S Take 2 tablets by mouth at bedtime as needed for mild constipation.   traZODone 50 MG tablet Commonly known as:  DESYREL Take 50 mg by mouth at bedtime.   vitamin E 400 UNIT capsule Take 400 Units by mouth daily.      Relevant Imaging Results:  Relevant Lab Results:   Additional Information SS: [id number]  Ross Ludwig, LCSWA

## 2017-06-27 NOTE — Progress Notes (Signed)
Per CCMD, patient had 30 beats of wide QRS with heart rate continuing to fluctuate 110-140s. Dr. Nehemiah Massed notified. Order to proceed with PO medications (aware of soft BP), and begin weaning cardizem gtt off about an hour after PO dose given. Will continue to monitor heart rate.

## 2017-06-27 NOTE — Care Management CC44 (Signed)
Condition Code 44 Documentation Completed  Patient Details  Name: LISE PINCUS MRN: [id number] Date of Birth: 1933-05-28   Condition Code 44 given:  Yes Patient signature on Condition Code 44 notice:  Yes Documentation of 2 MD's agreement:  Yes Code 44 added to claim:  Yes    Katrina Stack, RN 06/27/2017, 3:43 PM

## 2017-06-27 NOTE — Progress Notes (Addendum)
Dr. Nehemiah Massed aware of 2 second pause and conversion to SR 70's (as reported by CCMD). No new orders at this time. Patient resting comfortably.

## 2017-06-27 NOTE — Progress Notes (Addendum)
ANTICOAGULATION CONSULT NOTE - Initial Consult  Pharmacy Consult for Heparin drip Indication: atrial fibrillation  Allergies  Allergen Reactions  . Macrobid [Nitrofurantoin] Rash  . Sulfa Antibiotics Rash    Patient Measurements: Height: 5\' 1"  (154.9 cm) Weight: 92 lb (41.7 kg) IBW/kg (Calculated) : 47.8 Heparin Dosing Weight: 41.7 kg  Vital Signs: Temp: 99.2 F (37.3 C) (01/07 2009) Temp Source: Oral (01/07 2009) BP: 112/62 (01/07 2344) Pulse Rate: 72 (01/07 2344)  Labs: Recent Labs    06/26/17 1236 06/27/17 0005  HGB 13.1  --   HCT 38.8  --   PLT 222  --   APTT 32  --   LABPROT 12.8  --   INR 0.97  --   HEPARINUNFRC  --  0.33  CREATININE 0.80  --   TROPONINI <0.03  --     Estimated Creatinine Clearance: 35.7 mL/min (by C-G formula based on SCr of 0.8 mg/dL).   Medical History: Past Medical History:  Diagnosis Date  . Absolute anemia 05/26/2015  . Acid reflux 05/26/2015  . Arthritis   . BP (high blood pressure) 05/26/2015  . CA of skin 05/26/2015  . Chronic constipation 05/26/2015  . Cyst and pseudocyst of pancreas 06/02/2013  . Edema leg 05/26/2015   Overview:  progressive   . Risk for falls   . Scoliosis   . Sepsis ([place]) 05/15/2015     Assessment: 84 yo female here with new onset AFib with RVR starting on heparin drip. No oral anticoagulation noted on PTA med list.   Hgb 13.1, plt 222 - WNL  Goal of Therapy:  Heparin level 0.3-0.7 units/ml Monitor platelets by anticoagulation protocol: Yes   Plan:  APTT and INR added on - pending Will order heparin drip 2000 units IV x1 then heparin drip at 600 units/hr (=6 ml/hr) Heparin level 8h after start of drip, CBC in AM  01/08 0000 heparin level 0.33. Continue current regimen. Recheck with AM labs to confirm.  01/08 AM heparin level 0.29. 600 unit bolus and increase rate to 700 units/hr. Recheck in 8 hours.  Pharmacy will continue to follow.   Anahla Bevis S 06/27/2017,12:52 AM

## 2017-06-28 LAB — URINE CULTURE

## 2018-01-10 ENCOUNTER — Other Ambulatory Visit: Payer: Self-pay | Admitting: Family Medicine

## 2018-01-10 DIAGNOSIS — R109 Unspecified abdominal pain: Secondary | ICD-10-CM

## 2018-01-10 DIAGNOSIS — R110 Nausea: Secondary | ICD-10-CM

## 2018-01-10 DIAGNOSIS — D72829 Elevated white blood cell count, unspecified: Secondary | ICD-10-CM

## 2018-01-10 DIAGNOSIS — R634 Abnormal weight loss: Secondary | ICD-10-CM

## 2018-01-12 ENCOUNTER — Ambulatory Visit: Admission: RE | Admit: 2018-01-12 | Payer: Medicare Other | Source: Ambulatory Visit

## 2018-05-31 ENCOUNTER — Other Ambulatory Visit: Payer: Self-pay | Admitting: Internal Medicine

## 2018-05-31 DIAGNOSIS — R110 Nausea: Secondary | ICD-10-CM

## 2018-05-31 DIAGNOSIS — R634 Abnormal weight loss: Secondary | ICD-10-CM

## 2018-06-01 ENCOUNTER — Ambulatory Visit: Payer: Medicare Other

## 2018-06-05 ENCOUNTER — Other Ambulatory Visit: Payer: Self-pay | Admitting: Student

## 2018-06-05 DIAGNOSIS — D490 Neoplasm of unspecified behavior of digestive system: Secondary | ICD-10-CM

## 2018-06-05 DIAGNOSIS — R110 Nausea: Secondary | ICD-10-CM

## 2018-06-05 DIAGNOSIS — R634 Abnormal weight loss: Secondary | ICD-10-CM

## 2018-06-06 ENCOUNTER — Ambulatory Visit: Payer: Medicare Other

## 2018-06-21 ENCOUNTER — Ambulatory Visit: Payer: Medicare Other

## 2018-06-27 ENCOUNTER — Ambulatory Visit
Admission: RE | Admit: 2018-06-27 | Discharge: 2018-06-27 | Disposition: A | Discharge status: Home / Self Care | Payer: Medicare Other | Source: Ambulatory Visit | Attending: Student | Admitting: Student

## 2018-06-27 DIAGNOSIS — R110 Nausea: Secondary | ICD-10-CM | POA: Insufficient documentation

## 2018-06-27 DIAGNOSIS — D490 Neoplasm of unspecified behavior of digestive system: Secondary | ICD-10-CM | POA: Insufficient documentation

## 2018-06-27 DIAGNOSIS — R634 Abnormal weight loss: Secondary | ICD-10-CM | POA: Insufficient documentation

## 2018-06-27 MED ORDER — IOHEXOL 300 MG/ML  SOLN
75.0000 mL | Freq: Once | INTRAMUSCULAR | Status: AC | PRN
  Administered 2018-06-27: 75 mL via INTRAVENOUS

## 2018-12-08 IMAGING — CT CT RENAL STONE PROTOCOL
2 of 4 series · 16 of 46 positions shown, 18 images · non-contrast
Comparison: CT abdomen and pelvis 06/08/2015.

CLINICAL DATA: Microscopic hematuria in a patient with left flank
and hip pain.

EXAM:
CT ABDOMEN AND PELVIS WITHOUT CONTRAST
TECHNIQUE: Multidetector CT imaging of the abdomen and pelvis was performed
following the standard protocol without IV contrast.

[Series 2: stone full standard · axial · 0.61mm/px · z∈[-674,-334]mm · 13 of 76 slices shown, 15 images]
[im 4/76  soft-tissue]
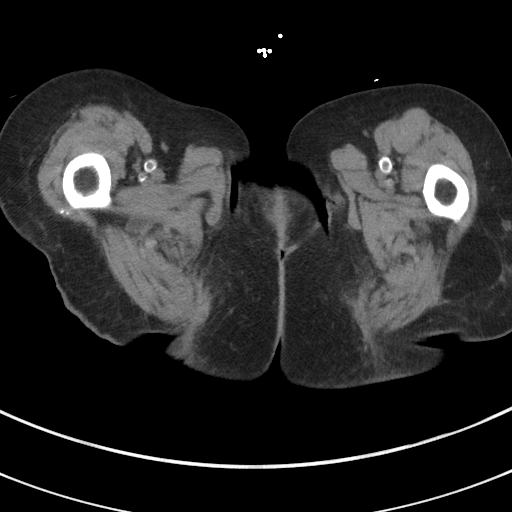
[im 4/76  bone]
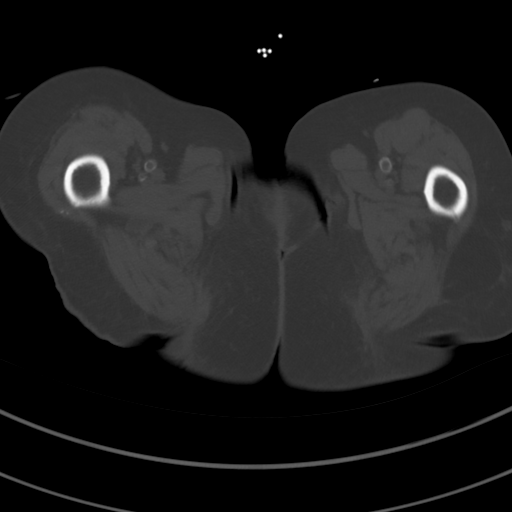
[im 11/76  soft-tissue]
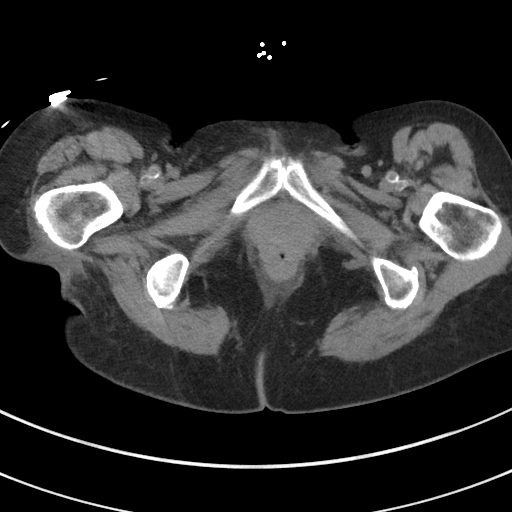
[im 18/76  soft-tissue]
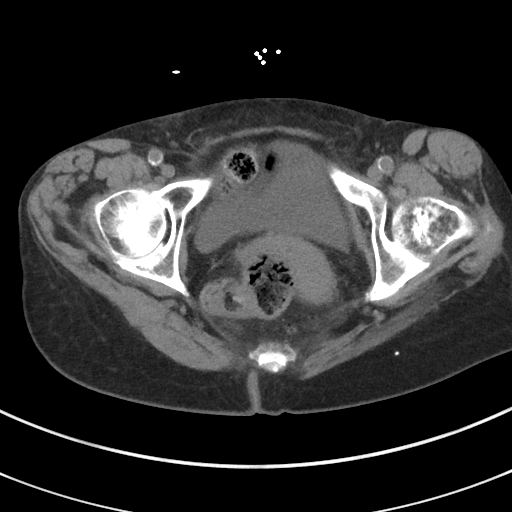
[im 21/76  soft-tissue]
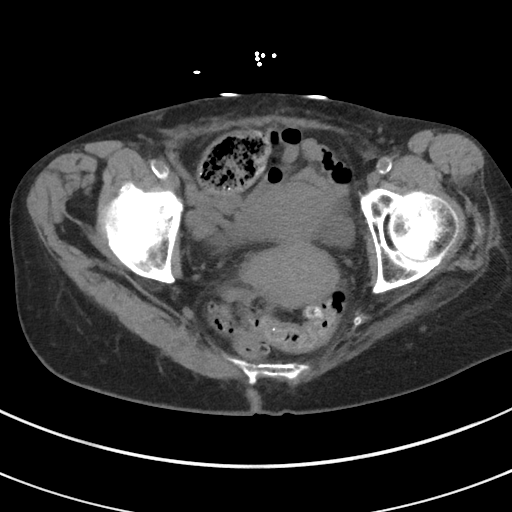
[im 28/76  soft-tissue]
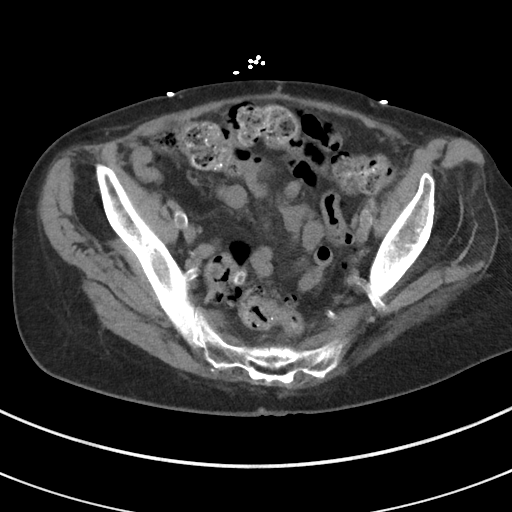
[im 31/76  soft-tissue]
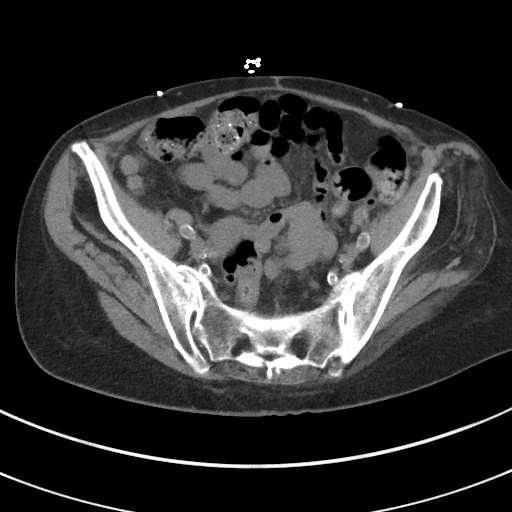
[im 38/76  soft-tissue]
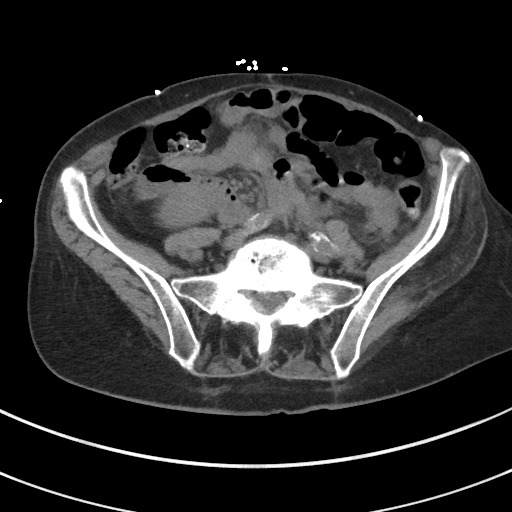
[im 45/76  soft-tissue]
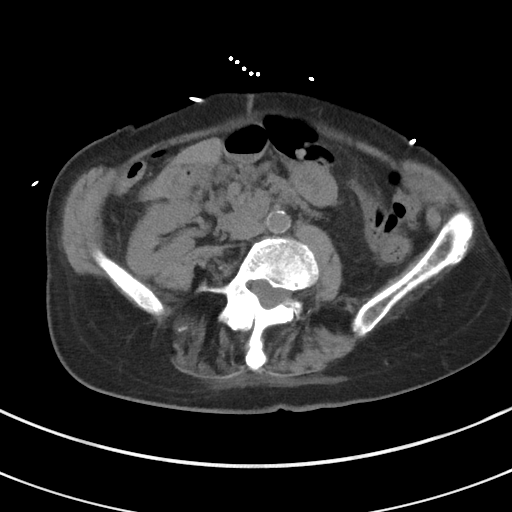
[im 48/76  soft-tissue]
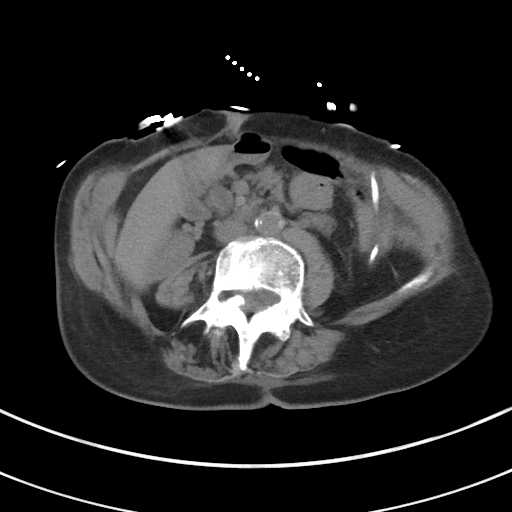
[im 48/76  bone]
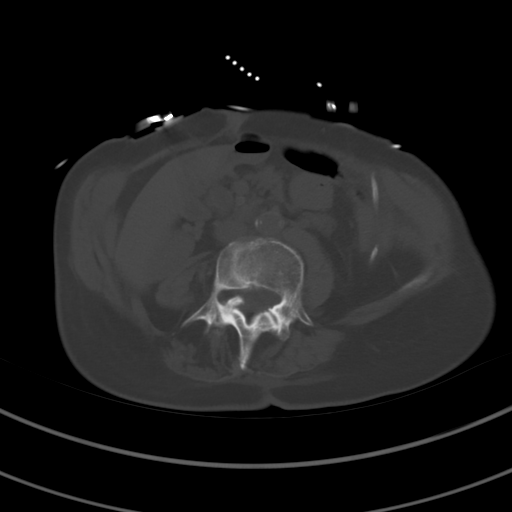
[im 55/76  soft-tissue]
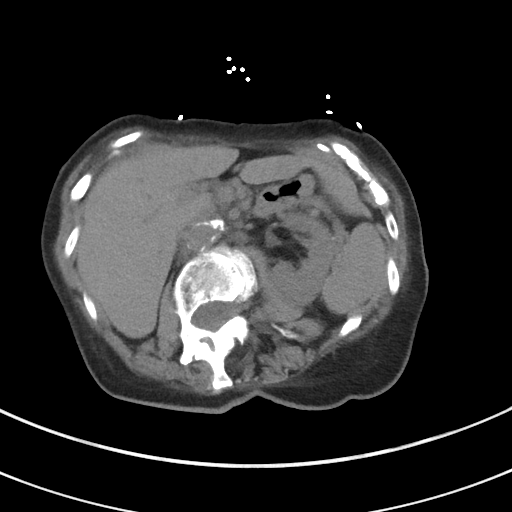
[im 58/76  soft-tissue]
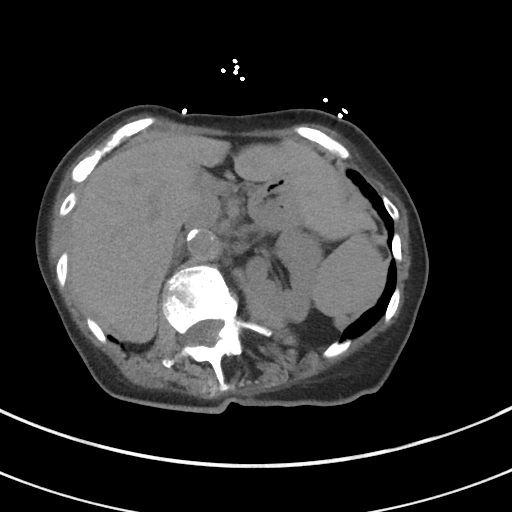
[im 65/76  soft-tissue]
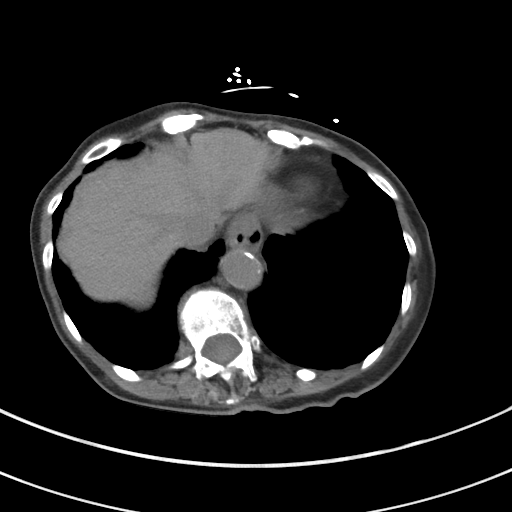
[im 72/76  soft-tissue]
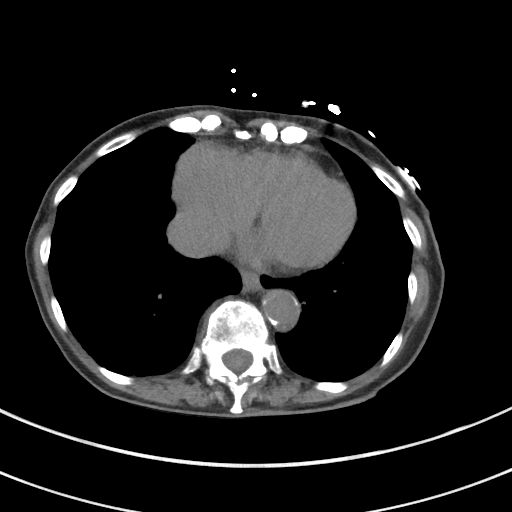

[Series 5: coronal · coronal · 0.61mm/px · 3 of 117 slices shown]
[im 39/117  soft-tissue]
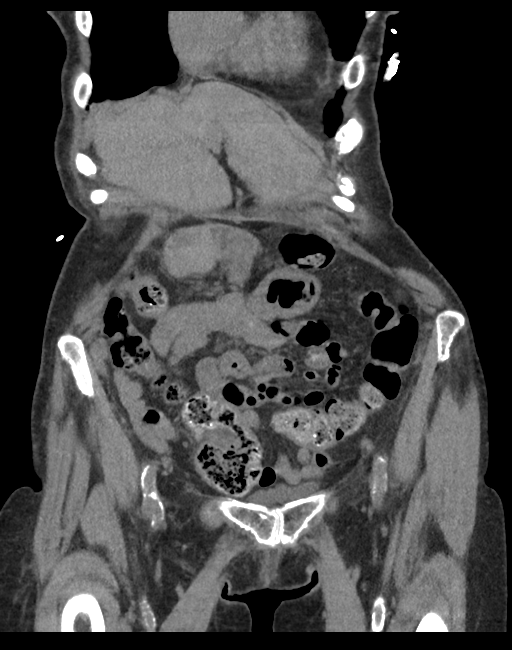
[im 52/117  soft-tissue]
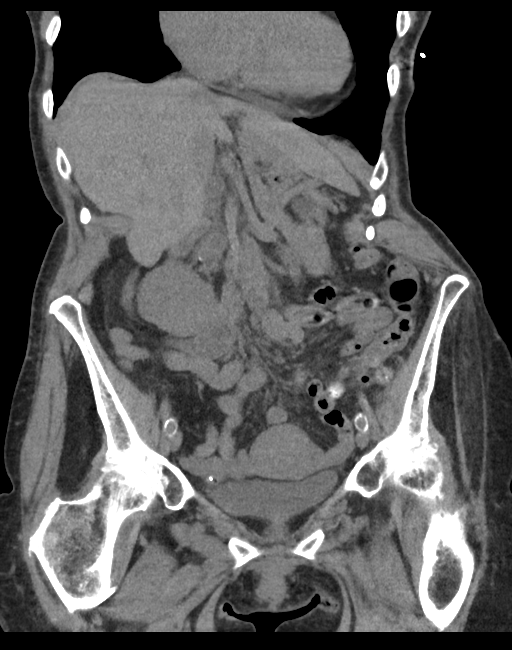
[im 65/117  soft-tissue]
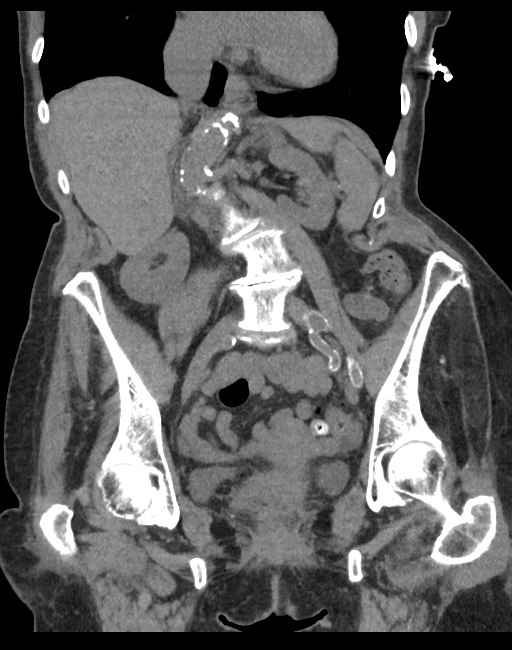

[16 of 46 positions shown; findings below may reference images not displayed]

FINDINGS: Lower chest: Lung bases are clear. No pleural or pericardial
effusion.

Hepatobiliary: No focal liver abnormality is seen. No gallstones,
gallbladder wall thickening, or biliary dilatation.

Pancreas: Unremarkable. No pancreatic ductal dilatation or
surrounding inflammatory changes.

Spleen: Normal in size without focal abnormality.

Adrenals/Urinary Tract: Nonobstructing stone lower pole right kidney
measures 0.3 cm. Punctate nonobstructing stone is seen in the lower
pole of the left kidney. No hydronephrosis on the right or left and
no ureteral or urinary bladder stones. Urinary bladder is
unremarkable. The adrenal glands appear normal.

Stomach/Bowel: Diverticulosis appears worst in the sigmoid. No
diverticulitis. The appendix has been removed. The stomach and small
bowel are unremarkable.

Vascular/Lymphatic: Aortic atherosclerosis. No enlarged abdominal or
pelvic lymph nodes.

Reproductive: Uterus and bilateral adnexa are unremarkable.

Other: No hernia.  No fluid collection.

Musculoskeletal: No lytic or sclerotic lesion. Severe convex right
scoliosis with the apex at approximately L1 is noted. The patient
has multilevel thoracic and lumbar degenerative change.
IMPRESSION: No acute abnormality.

Single small nonobstructing bilateral renal stones.

Diverticulosis without diverticulitis.

Atherosclerosis.

Marked scoliosis and multilevel degenerative disc disease.

## 2019-02-12 ENCOUNTER — Emergency Department
Admission: EM | Admit: 2019-02-12 | Discharge: 2019-02-12 | Disposition: A | Discharge status: Home / Self Care | Payer: Medicare Other | Attending: Emergency Medicine | Admitting: Emergency Medicine

## 2019-02-12 ENCOUNTER — Other Ambulatory Visit: Payer: Self-pay

## 2019-02-12 ENCOUNTER — Emergency Department: Payer: Medicare Other

## 2019-02-12 DIAGNOSIS — Y9301 Activity, walking, marching and hiking: Secondary | ICD-10-CM | POA: Diagnosis not present

## 2019-02-12 DIAGNOSIS — Z7982 Long term (current) use of aspirin: Secondary | ICD-10-CM | POA: Diagnosis not present

## 2019-02-12 DIAGNOSIS — Z79899 Other long term (current) drug therapy: Secondary | ICD-10-CM | POA: Diagnosis not present

## 2019-02-12 DIAGNOSIS — S7012XA Contusion of left thigh, initial encounter: Secondary | ICD-10-CM | POA: Diagnosis not present

## 2019-02-12 DIAGNOSIS — Y92018 Other place in single-family (private) house as the place of occurrence of the external cause: Secondary | ICD-10-CM | POA: Insufficient documentation

## 2019-02-12 DIAGNOSIS — Y998 Other external cause status: Secondary | ICD-10-CM | POA: Insufficient documentation

## 2019-02-12 DIAGNOSIS — Z85828 Personal history of other malignant neoplasm of skin: Secondary | ICD-10-CM | POA: Insufficient documentation

## 2019-02-12 DIAGNOSIS — Z7901 Long term (current) use of anticoagulants: Secondary | ICD-10-CM | POA: Insufficient documentation

## 2019-02-12 DIAGNOSIS — S79922A Unspecified injury of left thigh, initial encounter: Secondary | ICD-10-CM | POA: Diagnosis present

## 2019-02-12 DIAGNOSIS — W19XXXA Unspecified fall, initial encounter: Secondary | ICD-10-CM

## 2019-02-12 DIAGNOSIS — W010XXA Fall on same level from slipping, tripping and stumbling without subsequent striking against object, initial encounter: Secondary | ICD-10-CM | POA: Insufficient documentation

## 2019-02-12 HISTORY — DX: Chronic atrial fibrillation, unspecified: I48.20

## 2019-02-12 HISTORY — DX: Bradycardia, unspecified: R00.1

## 2019-02-12 HISTORY — DX: Unspecified osteoarthritis, unspecified site: M19.90

## 2019-02-12 NOTE — ED Notes (Signed)
This RN called [facility] to arrange transport for pt back home. [facility] Rivers stated that pt's friend [name] transports pt. Called [name] back to arrange transportation. [name] stated she would come pick pt up.

## 2019-02-12 NOTE — ED Notes (Signed)
E-signature did not transfer over from pad to computer. Pt signed. Verbalized understanding of DC instructions. Taken to lobby to wait for friend, [name], to pick pt up

## 2019-02-12 NOTE — ED Triage Notes (Signed)
Pt arrives to ED via [facility] for a fall yesterday at 2pm. Pt remembers falling, denies LOC. Takes eliquis. Bruise to L hip. Has been walking around since yesterday. A&O.

## 2019-02-12 NOTE — ED Notes (Signed)
EDP at bedside  

## 2019-02-12 NOTE — ED Notes (Signed)
Pt taken to scans via stretcher.  

## 2019-02-12 NOTE — ED Notes (Signed)
Pt ambulatory to toilet with steady gait noted.  

## 2019-02-12 NOTE — ED Provider Notes (Signed)
[facility] Emergency Department Provider Note   ____________________________________________   First MD Initiated Contact with Patient 02/12/19 [phone]     (approximate)  I have reviewed the triage vital signs and the nursing notes.   HISTORY  Chief Complaint Fall    HPI Shelly Ponce is a 85 y.o. female with past medical history of A. fib on Eliquis, hypertension presents to the ED following fall.  Per EMS, patient fell yesterday afternoon around 2 PM.  Patient states that she has a tennis ball hanging from the ceiling she uses as a lever, ran into this and lost her balance, causing her to fall to the concrete.  She hit her head but did not lose consciousness, also fell onto her left side.  She sustained skin tears to her shins bilaterally as well as a hematoma to her left hip and thigh.  She states she has been able to walk, but was told to come to the ED for further evaluation of her hematoma.  She denies any headache, neck pain, chest pain, abdominal pain, or upper extremity pain.  She has not had any vision changes, speech changes, numbness, or weakness.        Past Medical History:  Diagnosis Date  . Absolute anemia 05/26/2015  . Acid reflux 05/26/2015  . Arthritis   . Atrial fibrillation, chronic   . BP (high blood pressure) 05/26/2015  . Bradycardia   . CA of skin 05/26/2015  . Chronic constipation 05/26/2015  . Cyst and pseudocyst of pancreas 06/02/2013  . Edema leg 05/26/2015   Overview:  progressive   . Osteoarthritis   . Risk for falls   . Scoliosis   . Sepsis ([place]) 05/15/2015    Patient Active Problem List   Diagnosis Date Noted  . Protein-calorie malnutrition, severe 06/27/2017  . A-fib ([place]) 06/26/2017  . Absolute anemia 05/26/2015  . Chronic constipation 05/26/2015  . Acid reflux 05/26/2015  . BP (high blood pressure) 05/26/2015  . Edema leg 05/26/2015  . OP (osteoporosis) 05/26/2015  . Dupuytren's contracture of foot 05/26/2015   . CA of skin 05/26/2015  . Abdominal pain   . Sepsis ([place]) 05/15/2015  . UTI (lower urinary tract infection) 05/15/2015  . Bursitis, trochanteric 01/22/2015  . Degeneration of intervertebral disc of lumbar region 01/23/2014  . Neuritis or radiculitis due to rupture of lumbar intervertebral disc 01/22/2014  . Cyst and pseudocyst of pancreas 06/02/2013    Past Surgical History:  Procedure Laterality Date  . APPENDECTOMY  05/16/2015   Procedure: APPENDECTOMY;  Surgeon: Florene Glen, MD;  Location: [facility] ORS;  Service: General;;  . CATARACT EXTRACTION W/PHACO Left 11/23/2016   Procedure: CATARACT EXTRACTION PHACO AND INTRAOCULAR LENS PLACEMENT ([place]);  Surgeon: Estill Cotta, MD;  Location: [facility] ORS;  Service: Ophthalmology;  Laterality: Left;  [place] 00:58.4 AP% 22.4 CDE 22.49 Fluid Pack lot # [id number]  . COLONOSCOPY  2006  . HERNIA REPAIR Right 3/9/   inguinal hernia  . LAPAROTOMY N/A 05/16/2015   Procedure: EXPLORATORY LAPAROTOMY, DRAINAGE OF INTRABDOMINAL ABSCESS;  Surgeon: Florene Glen, MD;  Location: [facility] ORS;  Service: General;  Laterality: N/A;  . TONSILLECTOMY  1960  . TUBAL LIGATION  1978    Prior to Admission medications   Medication Sig Start Date End Date Taking? Authorizing Provider  acetaminophen (TYLENOL) 325 MG tablet Take 650 mg by mouth every 4 (four) hours as needed.    [provider]  apixaban (ELIQUIS) 2.5 MG TABS tablet  Take 1 tablet (2.5 mg total) by mouth 2 (two) times daily. 06/27/17   [name], MD  aspirin EC 81 MG tablet Take 81 mg by mouth daily.    [provider]  diltiazem (CARDIZEM CD) 240 MG 24 hr capsule Take 1 capsule (240 mg total) by mouth daily. 06/28/17   [name], MD  estradiol (ESTRACE) 0.5 MG tablet Take 0.5 mg by mouth daily.    [provider]  HYDROcodone-acetaminophen (NORCO/VICODIN) 5-325 MG tablet Take 1 tablet by mouth every 6 (six) hours as needed for moderate pain. Patient  taking differently: Take 1 tablet by mouth 2 (two) times daily.  05/23/15   Clayburn Pert, MD  loperamide (IMODIUM) 2 MG capsule Take 2 mg by mouth as needed for diarrhea or loose stools.    [provider]  Melatonin 10 MG TABS Take 10 mg by mouth at bedtime as needed.    [provider]  Multiple Vitamins-Minerals (PRESERVISION AREDS PO) Take 1 capsule by mouth 2 (two) times daily.    [provider]  multivitamin-iron-minerals-folic acid (THERAPEUTIC-M) TABS tablet Take 1 tablet by mouth daily.    [provider]  progesterone (PROMETRIUM) 100 MG capsule Take 100 mg by mouth daily. 15 DAYS PER MONTH    [provider]  promethazine (PHENERGAN) 25 MG suppository Place 25 mg rectally every 8 (eight) hours as needed for nausea or vomiting.    [provider]  senna-docusate (SENOKOT-S) 8.6-50 MG tablet Take 2 tablets by mouth at bedtime as needed for mild constipation.    [provider]  traZODone (DESYREL) 50 MG tablet Take 50 mg by mouth at bedtime.     [provider]  vitamin E 400 UNIT capsule Take 400 Units by mouth daily.    [provider]    Allergies Macrobid [nitrofurantoin] and Sulfa antibiotics  Family History  Problem Relation Age of Onset  . Dementia Mother   . Leukemia Father     Social History Social History   Tobacco Use  . Smoking status: Never Smoker  . Smokeless tobacco: Never Used  Substance Use Topics  . Alcohol use: No  . Drug use: No    Review of Systems  Constitutional: No fever/chills Eyes: No visual changes. ENT: No sore throat. Cardiovascular: Denies chest pain. Respiratory: Denies shortness of breath. Gastrointestinal: No abdominal pain.  No nausea, no vomiting.  No diarrhea.  No constipation. Genitourinary: Negative for dysuria. Musculoskeletal: Negative for back pain.  Positive for hematoma. Skin: Negative for rash.  Positive for skin tears. Neurological:  Negative for headaches, focal weakness or numbness.  ____________________________________________   PHYSICAL EXAM:  VITAL SIGNS: ED Triage Vitals  Enc Vitals Group     BP      Pulse      Resp      Temp      Temp src      SpO2      Weight      Height      Head Circumference      Peak Flow      Pain Score      Pain Loc      Pain Edu?      Excl. in [place]?     Constitutional: Alert and oriented. Eyes: Conjunctivae are normal. Head: Atraumatic. Nose: No congestion/rhinnorhea. Mouth/Throat: Mucous membranes are moist. Neck: Normal ROM, no midline cervical spine tenderness. Cardiovascular: Normal rate, regular rhythm. Grossly normal heart sounds. Respiratory: Normal respiratory effort.  No retractions. Lungs CTAB. Gastrointestinal: Soft and nontender. No distention. Genitourinary: deferred Musculoskeletal: No lower extremity tenderness nor edema.  Large hematoma to left lateral thigh with mild associated tenderness.  Range of motion intact to bilateral upper and lower extremities with no associated pain. Neurologic:  Normal speech and language. No gross focal neurologic deficits are appreciated.  5-5 strength in bilateral upper and lower extremities, no pronator drift. Skin:  Skin is warm, dry and intact. No rash noted.  Skin tears to bilateral shins with dressings in place and no active bleeding. Psychiatric: Mood and affect are normal. Speech and behavior are normal.  ____________________________________________   LABS (all labs ordered are listed, but only abnormal results are displayed)  Labs Reviewed - No data to display   PROCEDURES  Procedure(s) performed (including Critical Care):  Procedures   ____________________________________________   INITIAL IMPRESSION / ASSESSMENT AND PLAN / ED COURSE       85 year old female, currently anticoagulated on Eliquis, presents to the ED following mechanical fall yesterday afternoon around 2 PM.  She has a large hematoma  to her left hip and lateral thigh, but reports this does not seem to be rapidly expanding and she has minimal discomfort with full range of motion in her left lower extremity.  Additionally, noted to have skin tears to bilateral shins, but no other obvious traumatic injuries.  Will assess with CT head and C-spine, as well as x-rays of left hip.  CT head and C-spine negative for acute process, x-rays of left hip also negative for acute process.  Patient ambulating without difficulty here in the ED, is appropriate for discharge back to nursing facility.  Counseled her to follow-up with PCP and return to the ED for new or worsening symptoms, patient agrees with plan.      ____________________________________________   FINAL CLINICAL IMPRESSION(S) / ED DIAGNOSES  Final diagnoses:  Fall, initial encounter  Thigh hematoma, left, initial encounter     ED Discharge Orders    None       Note:  This document was prepared using Dragon voice recognition software and may include unintentional dictation errors.   Blake Divine, MD 02/12/19 [phone]

## 2019-07-30 DIAGNOSIS — C4492 Squamous cell carcinoma of skin, unspecified: Secondary | ICD-10-CM

## 2019-07-30 HISTORY — DX: Squamous cell carcinoma of skin, unspecified: C44.92

## 2019-10-03 ENCOUNTER — Ambulatory Visit: Payer: Medicare Other | Admitting: Dermatology

## 2020-01-30 ENCOUNTER — Ambulatory Visit: Payer: Medicare Other | Admitting: Dermatology

## 2020-02-03 ENCOUNTER — Other Ambulatory Visit: Payer: Self-pay

## 2020-02-03 ENCOUNTER — Ambulatory Visit (INDEPENDENT_AMBULATORY_CARE_PROVIDER_SITE_OTHER): Payer: Medicare Other | Admitting: Dermatology

## 2020-02-03 DIAGNOSIS — L820 Inflamed seborrheic keratosis: Secondary | ICD-10-CM

## 2020-02-03 DIAGNOSIS — L821 Other seborrheic keratosis: Secondary | ICD-10-CM | POA: Diagnosis not present

## 2020-02-03 DIAGNOSIS — L570 Actinic keratosis: Secondary | ICD-10-CM

## 2020-02-03 DIAGNOSIS — D485 Neoplasm of uncertain behavior of skin: Secondary | ICD-10-CM

## 2020-02-03 DIAGNOSIS — L578 Other skin changes due to chronic exposure to nonionizing radiation: Secondary | ICD-10-CM | POA: Diagnosis not present

## 2020-02-03 NOTE — Progress Notes (Signed)
   Follow-Up Visit   Subjective  Shelly Ponce is a 86 y.o. female who presents for the following: lesions (patient has numerous lesions on her skin that she is concerned about some are irregular, irritated, and itchy).  The following portions of the chart were reviewed this encounter and updated as appropriate:  Tobacco  Allergies  Meds  Problems  Med Hx  Surg Hx  Fam Hx     Review of Systems:  No other skin or systemic complaints except as noted in HPI or Assessment and Plan.  Objective  Well appearing patient in no apparent distress; mood and affect are within normal limits.  A focused examination was performed including face, chest, back, arms, legs. Relevant physical exam findings are noted in the Assessment and Plan.  Objective  R cheek x 5, back x 1 (6): Erythematous keratotic or waxy stuck-on papule or plaque.   Objective  Face x 1, chest x 1, R lower leg x 1, R forearm x 1, R forehead x 2 (6): Erythematous thin papules/macules with gritty scale.   Objective  Upper back: Scaly pink patch       Assessment & Plan  Inflamed seborrheic keratosis (6) R cheek x 5, back x 1  Destruction of lesion - R cheek x 5, back x 1 Complexity: simple   Destruction method: cryotherapy   Informed consent: discussed and consent obtained   Timeout:  patient name, date of birth, surgical site, and procedure verified Lesion destroyed using liquid nitrogen: Yes   Region frozen until ice ball extended beyond lesion: Yes   Outcome: patient tolerated procedure well with no complications   Post-procedure details: wound care instructions given    AK (actinic keratosis) (6) Face x 1, chest x 1, R lower leg x 1, R forearm x 1, R forehead x 2  Destruction of lesion - Face x 1, chest x 1, R lower leg x 1, R forearm x 1, R forehead x 2 Complexity: simple   Destruction method: cryotherapy   Informed consent: discussed and consent obtained   Timeout:  patient name, date of birth,  surgical site, and procedure verified Lesion destroyed using liquid nitrogen: Yes   Region frozen until ice ball extended beyond lesion: Yes   Outcome: patient tolerated procedure well with no complications   Post-procedure details: wound care instructions given    Neoplasm of uncertain behavior of skin - Actinic Keratosis Upper back Start 5FU/Calcipotriene cream BID x 7 days (patient has at home already but if she can't find it she will call us back for a new rx).  Seborrheic Keratoses - Stuck-on, waxy, tan-brown papules and plaques  - Discussed benign etiology and prognosis. - Observe - Call for any changes  Actinic Damage - diffuse scaly erythematous macules with underlying dyspigmentation - Recommend daily broad spectrum sunscreen SPF 30+ to sun-exposed areas, reapply every 2 hours as needed.  - Call for new or changing lesions.  Return in about 2 months (around 04/04/2020), or if symptoms worsen or fail to improve.  Luther Redo, CMA, am acting as scribe for Sarina Ser, MD .  Documentation: I have reviewed the above documentation for accuracy and completeness, and I agree with the above.  Sarina Ser, MD

## 2020-02-10 ENCOUNTER — Encounter: Payer: Self-pay | Admitting: Dermatology

## 2020-03-25 ENCOUNTER — Ambulatory Visit: Payer: Medicare Other | Admitting: Dermatology

## 2020-04-09 ENCOUNTER — Other Ambulatory Visit: Payer: Self-pay

## 2020-04-09 ENCOUNTER — Ambulatory Visit (INDEPENDENT_AMBULATORY_CARE_PROVIDER_SITE_OTHER): Payer: Medicare Other | Admitting: Dermatology

## 2020-04-09 DIAGNOSIS — D692 Other nonthrombocytopenic purpura: Secondary | ICD-10-CM

## 2020-04-09 DIAGNOSIS — D485 Neoplasm of uncertain behavior of skin: Secondary | ICD-10-CM

## 2020-04-09 DIAGNOSIS — L570 Actinic keratosis: Secondary | ICD-10-CM | POA: Diagnosis not present

## 2020-04-09 DIAGNOSIS — L578 Other skin changes due to chronic exposure to nonionizing radiation: Secondary | ICD-10-CM | POA: Diagnosis not present

## 2020-04-09 DIAGNOSIS — C44722 Squamous cell carcinoma of skin of right lower limb, including hip: Secondary | ICD-10-CM | POA: Diagnosis not present

## 2020-04-09 DIAGNOSIS — Z85828 Personal history of other malignant neoplasm of skin: Secondary | ICD-10-CM | POA: Diagnosis not present

## 2020-04-09 DIAGNOSIS — L820 Inflamed seborrheic keratosis: Secondary | ICD-10-CM | POA: Diagnosis not present

## 2020-04-09 NOTE — Progress Notes (Signed)
Follow-Up Visit   Subjective  Shelly Ponce is a 86 y.o. female who presents for the following: Actinic Keratosis (follow up). She also has history of BCC with spot on the back recently treated with topical anticancer treatment.  She got a good reaction and is doing better.  She has several areas to be evaluated today.  The following portions of the chart were reviewed this encounter and updated as appropriate:  Tobacco  Allergies  Meds  Problems  Med Hx  Surg Hx  Fam Hx     Review of Systems:  No other skin or systemic complaints except as noted in HPI or Assessment and Plan.  Objective  Well appearing patient in no apparent distress; mood and affect are within normal limits.  A focused examination was performed including face, neck, back, right lower leg. Relevant physical exam findings are noted in the Assessment and Plan.  Objective  Right Lower Leg - Anterior: 1.2 cm hyperkeratotic papule with crust  Objective  Face, neck (7): Erythematous thin papules/macules with gritty scale.   Objective  Left Forearm - Posterior: Erythematous keratotic or waxy stuck-on papule or plaque.   Objective  Mid Back: Well healed scar with no evidence of recurrence.    Assessment & Plan    Actinic Damage - diffuse scaly erythematous macules with underlying dyspigmentation - Recommend daily broad spectrum sunscreen SPF 30+ to sun-exposed areas, reapply every 2 hours as needed.  - Call for new or changing lesions.  Purpura - Violaceous macules and patches - Benign - Related to age, sun damage and/or use of blood thinners - Observe - Can use OTC arnica containing moisturizer such as Dermend Bruise Formula if desired - Call for worsening or other concerns  Neoplasm of uncertain behavior of skin Right Lower Leg - Anterior  Epidermal / dermal shaving  Lesion diameter (cm):  1.2 Informed consent: discussed and consent obtained   Timeout: patient name, date of birth,  surgical site, and procedure verified   Procedure prep:  Patient was prepped and draped in usual sterile fashion Prep type:  Isopropyl alcohol Anesthesia: the lesion was anesthetized in a standard fashion   Anesthetic:  1% lidocaine w/ epinephrine 1-100,000 buffered w/ 8.4% NaHCO3 Instrument used: flexible razor blade   Hemostasis achieved with: pressure, aluminum chloride and electrodesiccation   Outcome: patient tolerated procedure well   Post-procedure details: sterile dressing applied and wound care instructions given   Dressing type: bandage and petrolatum    Destruction of lesion Complexity: extensive   Destruction method: electrodesiccation and curettage   Informed consent: discussed and consent obtained   Timeout:  patient name, date of birth, surgical site, and procedure verified Procedure prep:  Patient was prepped and draped in usual sterile fashion Prep type:  Isopropyl alcohol Anesthesia: the lesion was anesthetized in a standard fashion   Anesthetic:  1% lidocaine w/ epinephrine 1-100,000 buffered w/ 8.4% NaHCO3 Curettage performed in three different directions: Yes   Electrodesiccation performed over the curetted area: Yes   Lesion length (cm):  1.2 Lesion width (cm):  1.2 Margin per side (cm):  0.2 Final wound size (cm):  1.6 Hemostasis achieved with:  pressure and aluminum chloride Outcome: patient tolerated procedure well with no complications   Post-procedure details: sterile dressing applied and wound care instructions given   Dressing type: bandage and petrolatum    Specimen 1 - Surgical pathology Differential Diagnosis: SCC vs other  Check Margins: No 1.2 cm hyperkeratotic papule with crust EDC today  AK (actinic keratosis) (7) Face, neck  Destruction of lesion - Face, neck Complexity: simple   Destruction method: cryotherapy   Informed consent: discussed and consent obtained   Timeout:  patient name, date of birth, surgical site, and procedure  verified Lesion destroyed using liquid nitrogen: Yes   Region frozen until ice ball extended beyond lesion: Yes   Outcome: patient tolerated procedure well with no complications   Post-procedure details: wound care instructions given    Inflamed seborrheic keratosis Left Forearm - Posterior  Destruction of lesion - Left Forearm - Posterior Complexity: simple   Destruction method: cryotherapy   Informed consent: discussed and consent obtained   Timeout:  patient name, date of birth, surgical site, and procedure verified Lesion destroyed using liquid nitrogen: Yes   Region frozen until ice ball extended beyond lesion: Yes   Outcome: patient tolerated procedure well with no complications   Post-procedure details: wound care instructions given    History of basal cell carcinoma (BCC) Mid Back  Clear. Observe for recurrence. Call clinic for new or changing lesions.  Recommend regular skin exams, daily broad-spectrum spf 30+ sunscreen use, and photoprotection.     Return in about 4 months (around 08/10/2020) for Follow up.  I, Ashok Cordia, CMA, am acting as scribe for Sarina Ser, MD .  Documentation: I have reviewed the above documentation for accuracy and completeness, and I agree with the above.  Sarina Ser, MD

## 2020-04-09 NOTE — Patient Instructions (Signed)

## 2020-04-14 ENCOUNTER — Encounter: Payer: Self-pay | Admitting: Dermatology

## 2020-04-15 ENCOUNTER — Telehealth: Payer: Self-pay

## 2020-04-15 NOTE — Telephone Encounter (Signed)
Advised patient's caregiver, [name], of results/hd

## 2020-04-15 NOTE — Telephone Encounter (Signed)
-----   Message from Ralene Bathe, MD sent at 04/15/2020 10:30 AM EDT ----- Diagnosis Skin , right lower leg - anterior WELL DIFFERENTIATED SQUAMOUS CELL CARCINOMA WITH SUPERFICIAL INFILTRATION  Cancer - SCC Already treated Recheck next visit

## 2020-08-13 ENCOUNTER — Ambulatory Visit (INDEPENDENT_AMBULATORY_CARE_PROVIDER_SITE_OTHER): Payer: Medicare Other | Admitting: Dermatology

## 2020-08-13 ENCOUNTER — Other Ambulatory Visit: Payer: Self-pay

## 2020-08-13 DIAGNOSIS — L578 Other skin changes due to chronic exposure to nonionizing radiation: Secondary | ICD-10-CM

## 2020-08-13 DIAGNOSIS — L820 Inflamed seborrheic keratosis: Secondary | ICD-10-CM

## 2020-08-13 DIAGNOSIS — L570 Actinic keratosis: Secondary | ICD-10-CM | POA: Diagnosis not present

## 2020-08-13 DIAGNOSIS — C44519 Basal cell carcinoma of skin of other part of trunk: Secondary | ICD-10-CM | POA: Diagnosis not present

## 2020-08-13 DIAGNOSIS — C4491 Basal cell carcinoma of skin, unspecified: Secondary | ICD-10-CM

## 2020-08-13 NOTE — Progress Notes (Signed)
   Follow-Up Visit   Subjective  Shelly Ponce is a 87 y.o. female who presents for the following: Follow-up (F/u for AKs to the face and neck x 7 treated with Ln2 at last visit. Recheck SCC on right lower leg that was treated with EDC at last visit. Pt would also like a spot on her back checked. ).  The following portions of the chart were reviewed this encounter and updated as appropriate:  Tobacco  Allergies  Meds  Problems  Med Hx  Surg Hx  Fam Hx     Review of Systems: No other skin or systemic complaints except as noted in HPI or Assessment and Plan.  Objective  Well appearing patient in no apparent distress; mood and affect are within normal limits.  A focused examination was performed including face, back, legs. Relevant physical exam findings are noted in the Assessment and Plan.  Objective  Mid Back: Recurrent BCC  Objective  forehead x 2 (2): Erythematous thin papules/macules with gritty scale.   Objective  b/l hands x 2: Erythematous keratotic or waxy stuck-on papule or plaque.   Assessment & Plan  Basal cell carcinoma (BCC), unspecified site Mid Back Recurrent Recommend excision. Pt will schedule.   Plan to check with Dr. Serafina Royals about pt coming off of Eliquis for any time period before and after the procedure.   Will send orders to [facility] with decision from cardiologist regarding blood thinners.  Phone number: [phone]  AK (actinic keratosis) forehead x 2 Destruction of lesion - forehead x 2 Complexity: simple   Destruction method: cryotherapy   Informed consent: discussed and consent obtained   Timeout:  patient name, date of birth, surgical site, and procedure verified Lesion destroyed using liquid nitrogen: Yes   Region frozen until ice ball extended beyond lesion: Yes   Outcome: patient tolerated procedure well with no complications   Post-procedure details: wound care instructions given    Inflamed seborrheic keratosis b/l  hands x 2 Prior to procedure, discussed risks of blister formation, small wound, skin dyspigmentation, or rare scar following cryotherapy.   Destruction of lesion - b/l hands x 2 Complexity: simple   Destruction method: cryotherapy   Informed consent: discussed and consent obtained   Timeout:  patient name, date of birth, surgical site, and procedure verified Lesion destroyed using liquid nitrogen: Yes   Region frozen until ice ball extended beyond lesion: Yes   Outcome: patient tolerated procedure well with no complications   Post-procedure details: wound care instructions given    Actinic Damage - chronic, secondary to cumulative UV radiation exposure/sun exposure over time - diffuse scaly erythematous macules with underlying dyspigmentation - Recommend daily broad spectrum sunscreen SPF 30+ to sun-exposed areas, reapply every 2 hours as needed.  - Call for new or changing lesions.  Return for [facility] excision .   IHarriett Sine, CMA, am acting as scribe for Sarina Ser, MD.  Documentation: I have reviewed the above documentation for accuracy and completeness, and I agree with the above.  Sarina Ser, MD

## 2020-08-15 ENCOUNTER — Encounter: Payer: Self-pay | Admitting: Dermatology

## 2020-08-17 ENCOUNTER — Telehealth: Payer: Self-pay

## 2020-08-17 NOTE — Telephone Encounter (Signed)
Dr. Nehemiah Massed,   Pt is scheduled for an in office excision of a recurrent basal cell carcinoma on her mid back on 09/08/20 with Dr. Sarina Ser. Due to risk of bleeding during surgery and bruising after we ask that if possible pts stop all blood thinning medication for a short time period before and after surgery.  We are wondering about the possibility of pt temporarily stopping her Eliquis for any time before and after procedure.    Thank you,  Harriett Sine, [place] [phone]

## 2020-09-03 ENCOUNTER — Telehealth: Payer: Self-pay

## 2020-09-03 NOTE — Telephone Encounter (Signed)
Called Dr Derrick Ravel office, left a message for his nurse to call me regarding this pt, pt will be here in the office March 22 for a surgery, Dr Sarina Ser would like to know if this pt could stop Eliquis before her surgery appt.

## 2020-09-03 NOTE — Telephone Encounter (Signed)
[name],   Pt is scheduled for an in office excision of a recurrent basal cell carcinoma on her mid back on 09/08/20 with Dr. Sarina Ser. Due to risk of bleeding during surgery and bruising after we ask that if possible pts stop all blood thinning medication for a short time period before and after surgery.  We are wondering about the possibility of pt temporarily stopping her Eliquis for any time before and after procedure.    I apologize for the short notice, we were told by the pt and her daughter that she was getting her Eliquis from her cardiologist. The nursing home just informed me that she is getting it from you.   Our office is closed tomorrow and over the weekend. If she is able to stop the Eliquis before the procedure, would it be possible for someone in your office to call her nursing home and let them know?   Kandis Mannan - [phone]  Thank you so much!  Shelly Ponce, [place] x4

## 2020-09-07 NOTE — Telephone Encounter (Signed)
[place] with Pharmacy called regarding patient and d/cing Eliquis. Patient has rescheduled surgery until April. If [name] agrees to d/c medication we need to call her at [phone] so order can be placed.

## 2020-09-08 ENCOUNTER — Encounter: Payer: Medicare Other | Admitting: Dermatology

## 2020-10-06 ENCOUNTER — Ambulatory Visit (INDEPENDENT_AMBULATORY_CARE_PROVIDER_SITE_OTHER): Payer: Medicare Other | Admitting: Dermatology

## 2020-10-06 ENCOUNTER — Other Ambulatory Visit: Payer: Self-pay

## 2020-10-06 ENCOUNTER — Encounter: Payer: Self-pay | Admitting: Dermatology

## 2020-10-06 DIAGNOSIS — C44519 Basal cell carcinoma of skin of other part of trunk: Secondary | ICD-10-CM | POA: Diagnosis not present

## 2020-10-06 DIAGNOSIS — D492 Neoplasm of unspecified behavior of bone, soft tissue, and skin: Secondary | ICD-10-CM

## 2020-10-06 MED ORDER — MUPIROCIN 2 % EX OINT
1.0000 | TOPICAL_OINTMENT | Freq: Every day | CUTANEOUS | 1 refills | Status: DC
Start: 2020-10-06 — End: 2020-10-06

## 2020-10-06 MED ORDER — MUPIROCIN 2 % EX OINT: 1.0000 "application " | TOPICAL_OINTMENT | Freq: Every day | CUTANEOUS | 1 refills | Status: DC

## 2020-10-06 NOTE — Patient Instructions (Signed)
If you have any questions or concerns for your doctor, please call our main line at [phone] and press option 4 to reach your doctor's medical assistant. If no one answers, please leave a voicemail as directed and we will return your call as soon as possible. Messages left after 4 pm will be answered the following business day.   You may also send us a message via MyChart. We typically respond to MyChart messages within 1-2 business days.  For prescription refills, please ask your pharmacy to contact our office. Our fax number is [phone].  If you have an urgent issue when the clinic is closed that cannot wait until the next business day, you can page your doctor at the number below.    Please note that while we do our best to be available for urgent issues outside of office hours, we are not available 24/7.   If you have an urgent issue and are unable to reach us, you may choose to seek medical care at your doctor's office, retail clinic, urgent care center, or emergency room.  If you have a medical emergency, please immediately call 911 or go to the emergency department.  Pager Numbers  - Dr. Kowalski: [phone]  - Dr. Moye: [phone]  - Dr. Stewart: [phone]  In the event of inclement weather, please call our main line at [phone] for an update on the status of any delays or closures.  Dermatology Medication Tips: Please keep the boxes that topical medications come in in order to help keep track of the instructions about where and how to use these. Pharmacies typically print the medication instructions only on the boxes and not directly on the medication tubes.   If your medication is too expensive, please contact our office at [phone] option 4 or send us a message through MyChart.   We are unable to tell what your co-pay for medications will be in advance as this is different depending on your insurance coverage. However, we may be able to find a  substitute medication at lower cost or fill out paperwork to get insurance to cover a needed medication.   If a prior authorization is required to get your medication covered by your insurance company, please allow us 1-2 business days to complete this process.  Drug prices often vary depending on where the prescription is filled and some pharmacies may offer cheaper prices.  The website www.goodrx.com contains coupons for medications through different pharmacies. The prices here do not account for what the cost may be with help from insurance (it may be cheaper with your insurance), but the website can give you the price if you did not use any insurance.  - You can print the associated coupon and take it with your prescription to the pharmacy.  - You may also stop by our office during regular business hours and pick up a GoodRx coupon card.  - If you need your prescription sent electronically to a different pharmacy, notify our office through [place] MyChart or by phone at [phone] option 4.     Wound Care Instructions  1. Cleanse wound gently with soap and water once a day then pat dry with clean gauze. Apply a thing coat of Petrolatum (petroleum jelly, "Vaseline") over the wound (unless you have an allergy to this). We recommend that you use a new, sterile tube of Vaseline. Do not pick or remove scabs. Do not remove the yellow or white "healing tissue" from the base of the wound.    2. Cover the wound with fresh, clean, nonstick gauze and secure with paper tape. You may use Band-Aids in place of gauze and tape if the would is small enough, but would recommend trimming much of the tape off as there is often too much. Sometimes Band-Aids can irritate the skin.  3. You should call the office for your biopsy report after 1 week if you have not already been contacted.  4. If you experience any problems, such as abnormal amounts of bleeding, swelling, significant bruising, significant pain,  or evidence of infection, please call the office immediately.  5. FOR ADULT SURGERY PATIENTS: If you need something for pain relief you may take 1 extra strength Tylenol (acetaminophen) AND 2 Ibuprofen (200mg each) together every 4 hours as needed for pain. (do not take these if you are allergic to them or if you have a reason you should not take them.) Typically, you may only need pain medication for 1 to 3 days.     

## 2020-10-06 NOTE — Progress Notes (Signed)
   Follow-Up Visit   Subjective  Shelly Ponce is a 87 y.o. female who presents for the following: Recurrent BCC vs other (Mid back, pt presents for excision today).  The following portions of the chart were reviewed this encounter and updated as appropriate:   Tobacco  Allergies  Meds  Problems  Med Hx  Surg Hx  Fam Hx     Review of Systems:  No other skin or systemic complaints except as noted in HPI or Assessment and Plan.  Objective  Well appearing patient in no apparent distress; mood and affect are within normal limits.  A focused examination was performed including back. Relevant physical exam findings are noted in the Assessment and Plan.  Objective  Spinal mid back: Crusted nodule 2.1 x 1.7cm   Assessment & Plan  Neoplasm of skin Spinal mid back  Skin excision  Lesion length (cm):  2.1 Lesion width (cm):  1.7 Margin per side (cm):  0.2 Total excision diameter (cm):  2.5 Informed consent: discussed and consent obtained   Timeout: patient name, date of birth, surgical site, and procedure verified   Procedure prep:  Patient was prepped and draped in usual sterile fashion Prep type:  Isopropyl alcohol and povidone-iodine Anesthesia: the lesion was anesthetized in a standard fashion   Anesthesia comment:  11 Anesthetic:  1% lidocaine w/ epinephrine 1-100,000 buffered w/ 8.4% NaHCO3 (0.5% bupivicaine buffered w/ 8.4% NaHC03) Instrument used: #15 blade   Hemostasis achieved with: pressure   Hemostasis achieved with comment:  Electrocautery Outcome: patient tolerated procedure well with no complications   Dressing: Mupirocin.    Skin repair Complexity:  Complex Final length (cm):  6 Reason for type of repair: reduce tension to allow closure, reduce the risk of dehiscence, infection, and necrosis, reduce subcutaneous dead space and avoid a hematoma, allow closure of the large defect, preserve normal anatomy, preserve normal anatomical and functional  relationships and enhance both functionality and cosmetic results   Undermining: area extensively undermined   Undermining comment:  Undermining Defect 2.5cm Subcutaneous layers (deep stitches):  Suture size:  2-0 Suture type: Vicryl (polyglactin 910)   Subcutaneous suture technique: Inverted Dermal. Fine/surface layer approximation (top stitches):  Suture size:  2-0 Suture type: nylon   Stitches: simple running   Suture removal (days):  7 Hemostasis achieved with: pressure Outcome: patient tolerated procedure well with no complications   Post-procedure details: sterile dressing applied and wound care instructions given   Dressing type: bandage, pressure dressing and bacitracin (Mupirocin)    Specimen 1 - Surgical pathology Differential Diagnosis: D48.5 Recurrent BCC vs other  Check Margins: yes Crusted nodule 2.1 x 1.7cm [id number]  Recurrent BCC vs other  Start Mupirocin oint qd to excision site  Reordered Medications mupirocin ointment (BACTROBAN) 2 %  Return in about 1 week (around 10/13/2020) for suture removal and check spot forehead.  I, Othelia Pulling, RMA, am acting as scribe for Sarina Ser, MD .  Documentation: I have reviewed the above documentation for accuracy and completeness, and I agree with the above.  Sarina Ser, MD

## 2020-10-07 ENCOUNTER — Telehealth: Payer: Self-pay

## 2020-10-07 ENCOUNTER — Encounter: Payer: Self-pay | Admitting: Dermatology

## 2020-10-07 NOTE — Telephone Encounter (Signed)
Patient doing fine after yesterdays surgery./sh 

## 2020-10-13 ENCOUNTER — Other Ambulatory Visit: Payer: Self-pay

## 2020-10-13 ENCOUNTER — Ambulatory Visit (INDEPENDENT_AMBULATORY_CARE_PROVIDER_SITE_OTHER): Payer: Medicare Other | Admitting: Dermatology

## 2020-10-13 DIAGNOSIS — D692 Other nonthrombocytopenic purpura: Secondary | ICD-10-CM

## 2020-10-13 DIAGNOSIS — Z85828 Personal history of other malignant neoplasm of skin: Secondary | ICD-10-CM

## 2020-10-13 DIAGNOSIS — Z4802 Encounter for removal of sutures: Secondary | ICD-10-CM

## 2020-10-13 DIAGNOSIS — L578 Other skin changes due to chronic exposure to nonionizing radiation: Secondary | ICD-10-CM

## 2020-10-13 NOTE — Patient Instructions (Signed)

## 2020-10-13 NOTE — Progress Notes (Signed)
   Follow-Up Visit   Subjective  Shelly Ponce is a 87 y.o. female who presents for the following: suture removal (For margins free BCC of the mid back spinal ).  The following portions of the chart were reviewed this encounter and updated as appropriate:   Tobacco  Allergies  Meds  Problems  Med Hx  Surg Hx  Fam Hx     Review of Systems:  No other skin or systemic complaints except as noted in HPI or Assessment and Plan.  Objective  Well appearing patient in no apparent distress; mood and affect are within normal limits.  A focused examination was performed including the back and arms. Relevant physical exam findings are noted in the Assessment and Plan.  Objective  Mid back spinal: Healing excision site.  Assessment & Plan  History of basal cell carcinoma (BCC) of skin Mid back spinal  Encounter for Removal of Sutures - Incision site at the mid back spinal is clean, dry and intact - Wound cleansed, sutures removed, wound cleansed and steri strips applied.  - Discussed pathology results showing margins free basal cell carcinoma. - Patient advised to keep steri-strips dry until they fall off. - Scars remodel for a full year. - Once steri-strips fall off, patient can apply over-the-counter silicone scar cream each night to help with scar remodeling if desired. - Patient advised to call with any concerns or if they notice any new or changing lesions.  Actinic Damage - chronic, secondary to cumulative UV radiation exposure/sun exposure over time - diffuse scaly erythematous macules with underlying dyspigmentation - Recommend daily broad spectrum sunscreen SPF 30+ to sun-exposed areas, reapply every 2 hours as needed.  - Recommend staying in the shade or wearing long sleeves, sun glasses (UVA+UVB protection) and wide brim hats (4-inch brim around the entire circumference of the hat). - Call for new or changing lesions.  Purpura - Chronic; persistent and recurrent.   Treatable, but not curable. - Violaceous macules and patches - Benign - Related to trauma, age, sun damage and/or use of blood thinners, chronic use of topical and/or oral steroids - Observe - Can use OTC arnica containing moisturizer such as Dermend Bruise Formula if desired - Call for worsening or other concerns  Return in about 6 months (around 04/14/2021) for TBSE.  Luther Redo, CMA, am acting as scribe for Sarina Ser, MD .  Documentation: I have reviewed the above documentation for accuracy and completeness, and I agree with the above.  Sarina Ser, MD

## 2020-10-15 ENCOUNTER — Encounter: Payer: Self-pay | Admitting: Dermatology

## 2020-10-29 ENCOUNTER — Emergency Department: Payer: Medicare Other

## 2020-10-29 ENCOUNTER — Inpatient Hospital Stay
Admission: EM | Admit: 2020-10-29 | Discharge: 2020-11-02 | DRG: 291 | Disposition: A | Discharge status: Nursing Home (Includes ICF) | Payer: Medicare Other | Attending: Internal Medicine | Admitting: Internal Medicine

## 2020-10-29 DIAGNOSIS — Z66 Do not resuscitate: Secondary | ICD-10-CM | POA: Diagnosis present

## 2020-10-29 DIAGNOSIS — I482 Chronic atrial fibrillation, unspecified: Secondary | ICD-10-CM | POA: Diagnosis present

## 2020-10-29 DIAGNOSIS — J918 Pleural effusion in other conditions classified elsewhere: Secondary | ICD-10-CM | POA: Diagnosis present

## 2020-10-29 DIAGNOSIS — Z882 Allergy status to sulfonamides status: Secondary | ICD-10-CM

## 2020-10-29 DIAGNOSIS — Z20822 Contact with and (suspected) exposure to covid-19: Secondary | ICD-10-CM | POA: Diagnosis present

## 2020-10-29 DIAGNOSIS — J9601 Acute respiratory failure with hypoxia: Secondary | ICD-10-CM | POA: Diagnosis present

## 2020-10-29 DIAGNOSIS — Z883 Allergy status to other anti-infective agents status: Secondary | ICD-10-CM

## 2020-10-29 DIAGNOSIS — J90 Pleural effusion, not elsewhere classified: Secondary | ICD-10-CM

## 2020-10-29 DIAGNOSIS — N182 Chronic kidney disease, stage 2 (mild): Secondary | ICD-10-CM | POA: Diagnosis present

## 2020-10-29 DIAGNOSIS — R531 Weakness: Secondary | ICD-10-CM | POA: Diagnosis present

## 2020-10-29 DIAGNOSIS — Z79899 Other long term (current) drug therapy: Secondary | ICD-10-CM

## 2020-10-29 DIAGNOSIS — I4891 Unspecified atrial fibrillation: Secondary | ICD-10-CM | POA: Diagnosis not present

## 2020-10-29 DIAGNOSIS — J9600 Acute respiratory failure, unspecified whether with hypoxia or hypercapnia: Secondary | ICD-10-CM | POA: Diagnosis present

## 2020-10-29 DIAGNOSIS — Z7901 Long term (current) use of anticoagulants: Secondary | ICD-10-CM

## 2020-10-29 DIAGNOSIS — I5043 Acute on chronic combined systolic (congestive) and diastolic (congestive) heart failure: Secondary | ICD-10-CM | POA: Diagnosis present

## 2020-10-29 DIAGNOSIS — R0902 Hypoxemia: Secondary | ICD-10-CM | POA: Diagnosis present

## 2020-10-29 DIAGNOSIS — Z85828 Personal history of other malignant neoplasm of skin: Secondary | ICD-10-CM

## 2020-10-29 DIAGNOSIS — I5031 Acute diastolic (congestive) heart failure: Secondary | ICD-10-CM | POA: Diagnosis not present

## 2020-10-29 DIAGNOSIS — K219 Gastro-esophageal reflux disease without esophagitis: Secondary | ICD-10-CM | POA: Diagnosis present

## 2020-10-29 DIAGNOSIS — I509 Heart failure, unspecified: Secondary | ICD-10-CM

## 2020-10-29 DIAGNOSIS — Z9889 Other specified postprocedural states: Secondary | ICD-10-CM

## 2020-10-29 DIAGNOSIS — I130 Hypertensive heart and chronic kidney disease with heart failure and stage 1 through stage 4 chronic kidney disease, or unspecified chronic kidney disease: Secondary | ICD-10-CM | POA: Diagnosis present

## 2020-10-29 LAB — COMPREHENSIVE METABOLIC PANEL
ALT: 18 U/L (ref 0–44)
AST: 47 U/L — ABNORMAL HIGH (ref 15–41)
Albumin: 3.8 g/dL (ref 3.5–5.0)
Alkaline Phosphatase: 52 U/L (ref 38–126)
Anion gap: 11 (ref 5–15)
BUN: 23 mg/dL (ref 8–23)
CO2: 19 mmol/L — ABNORMAL LOW (ref 22–32)
Calcium: 9 mg/dL (ref 8.9–10.3)
Chloride: 109 mmol/L (ref 98–111)
Creatinine, Ser: 0.9 mg/dL (ref 0.44–1.00)
GFR, Estimated: >60 mL/min (ref >60)
Glucose, Bld: 135 mg/dL — ABNORMAL HIGH (ref 70–99)
Potassium: 4.9 mmol/L (ref 3.5–5.1)
Sodium: 139 mmol/L (ref 135–145)
Total Bilirubin: 1.2 mg/dL (ref 0.3–1.2)
Total Protein: 6.3 g/dL — ABNORMAL LOW (ref 6.5–8.1)

## 2020-10-29 LAB — CBC
HCT: 40.1 % (ref 36.0–46.0)
Hemoglobin: 12.9 g/dL (ref 12.0–15.0)
MCH: 32 pg (ref 26.0–34.0)
MCHC: 32.2 g/dL (ref 30.0–36.0)
MCV: 99.5 fL (ref 80.0–100.0)
Platelets: 222 10*3/uL (ref 150–400)
RBC: 4.03 MIL/uL (ref 3.87–5.11)
RDW: 16.4 % — ABNORMAL HIGH (ref 11.5–15.5)
WBC: 7.4 10*3/uL (ref 4.0–10.5)
nRBC: 0 % (ref 0.0–0.2)

## 2020-10-29 LAB — BRAIN NATRIURETIC PEPTIDE: B Natriuretic Peptide: 410 pg/mL — ABNORMAL HIGH (ref 0.0–100.0)

## 2020-10-29 LAB — TROPONIN I (HIGH SENSITIVITY)
Troponin I (High Sensitivity): 14 ng/L (ref <18)
Troponin I (High Sensitivity): 13 ng/L (ref <18)

## 2020-10-29 MED ORDER — DILTIAZEM HCL-DEXTROSE 125-5 MG/125ML-% IV SOLN (PREMIX)
5.0000 mg/h | INTRAVENOUS | Status: DC
  Administered 2020-10-29: 5 mg/h via INTRAVENOUS
  Filled 2020-10-29: qty 125

## 2020-10-29 MED ORDER — PROGESTERONE MICRONIZED 100 MG PO CAPS
100.0000 mg | ORAL_CAPSULE | Freq: Every day | ORAL | Status: DC
  Filled 2020-10-29: qty 1

## 2020-10-29 MED ORDER — TRAZODONE HCL 50 MG PO TABS
50.0000 mg | ORAL_TABLET | Freq: Every day | ORAL | Status: DC
  Administered 2020-10-29 – 2020-11-01 (×4): 50 mg via ORAL
  Filled 2020-10-29 (×4): qty 1

## 2020-10-29 MED ORDER — ESTRADIOL 1 MG PO TABS
0.5000 mg | ORAL_TABLET | Freq: Every day | ORAL | Status: DC
  Administered 2020-10-30 – 2020-11-01 (×3): 0.5 mg via ORAL
  Filled 2020-10-29 (×4): qty 0.5

## 2020-10-29 MED ORDER — VITAMIN D 25 MCG (1000 UNIT) PO TABS
1000.0000 [IU] | ORAL_TABLET | Freq: Every day | ORAL | Status: DC
  Administered 2020-10-30 – 2020-11-02 (×4): 1000 [IU] via ORAL
  Filled 2020-10-29 (×4): qty 1

## 2020-10-29 MED ORDER — OCUVITE-LUTEIN PO CAPS
ORAL_CAPSULE | Freq: Two times a day (BID) | ORAL | Status: DC
  Administered 2020-10-29 – 2020-11-01 (×7): 1 via ORAL
  Filled 2020-10-29 (×9): qty 1

## 2020-10-29 MED ORDER — ACETAMINOPHEN 500 MG PO TABS
500.0000 mg | ORAL_TABLET | Freq: Three times a day (TID) | ORAL | Status: DC | PRN
  Administered 2020-10-29 – 2020-10-30 (×2): 500 mg via ORAL
  Filled 2020-10-29 (×2): qty 1

## 2020-10-29 MED ORDER — DILTIAZEM LOAD VIA INFUSION
10.0000 mg | Freq: Once | INTRAVENOUS | Status: AC
  Administered 2020-10-29: 10 mg via INTRAVENOUS
  Filled 2020-10-29: qty 10

## 2020-10-29 MED ORDER — HYDROCODONE-ACETAMINOPHEN 10-325 MG PO TABS
1.0000 | ORAL_TABLET | Freq: Three times a day (TID) | ORAL | Status: DC
  Administered 2020-10-29 – 2020-11-02 (×12): 1 via ORAL
  Filled 2020-10-29 (×12): qty 1

## 2020-10-29 MED ORDER — APIXABAN 2.5 MG PO TABS
2.5000 mg | ORAL_TABLET | Freq: Two times a day (BID) | ORAL | Status: DC
  Administered 2020-10-29 – 2020-11-02 (×8): 2.5 mg via ORAL
  Filled 2020-10-29 (×9): qty 1

## 2020-10-29 MED ORDER — SODIUM CHLORIDE 0.9% FLUSH: 3.0000 mL | INTRAVENOUS | Status: DC | PRN

## 2020-10-29 MED ORDER — FUROSEMIDE 10 MG/ML IJ SOLN
40.0000 mg | Freq: Every day | INTRAMUSCULAR | Status: DC
  Administered 2020-10-30 – 2020-10-31 (×2): 40 mg via INTRAVENOUS
  Filled 2020-10-29 (×2): qty 4

## 2020-10-29 MED ORDER — METOPROLOL TARTRATE 5 MG/5ML IV SOLN
2.5000 mg | INTRAVENOUS | Status: DC | PRN
  Administered 2020-10-31: 2.5 mg via INTRAVENOUS
  Filled 2020-10-29 (×2): qty 5

## 2020-10-29 MED ORDER — FUROSEMIDE 10 MG/ML IJ SOLN
20.0000 mg | Freq: Once | INTRAMUSCULAR | Status: AC
  Administered 2020-10-29: 20 mg via INTRAVENOUS
  Filled 2020-10-29: qty 4

## 2020-10-29 MED ORDER — ONDANSETRON HCL 4 MG/2ML IJ SOLN: 4.0000 mg | Freq: Four times a day (QID) | INTRAMUSCULAR | Status: DC | PRN

## 2020-10-29 MED ORDER — SODIUM CHLORIDE 0.9 % IV SOLN: 250.0000 mL | INTRAVENOUS | Status: DC | PRN

## 2020-10-29 MED ORDER — SODIUM CHLORIDE 0.9% FLUSH
3.0000 mL | Freq: Two times a day (BID) | INTRAVENOUS | Status: DC
  Administered 2020-10-29 – 2020-11-02 (×8): 3 mL via INTRAVENOUS

## 2020-10-29 MED ORDER — SENNOSIDES-DOCUSATE SODIUM 8.6-50 MG PO TABS
1.0000 | ORAL_TABLET | Freq: Every day | ORAL | Status: DC
  Administered 2020-10-30 – 2020-11-01 (×3): 1 via ORAL
  Filled 2020-10-29 (×4): qty 1

## 2020-10-29 MED ORDER — METOPROLOL TARTRATE 5 MG/5ML IV SOLN
5.0000 mg | Freq: Once | INTRAVENOUS | Status: AC
  Administered 2020-10-29: 5 mg via INTRAVENOUS
  Filled 2020-10-29: qty 5

## 2020-10-29 MED ORDER — PANTOPRAZOLE SODIUM 40 MG PO TBEC
40.0000 mg | DELAYED_RELEASE_TABLET | Freq: Every day | ORAL | Status: DC
  Administered 2020-10-30 – 2020-11-02 (×4): 40 mg via ORAL
  Filled 2020-10-29 (×4): qty 1

## 2020-10-29 MED ORDER — DOCUSATE SODIUM 100 MG PO CAPS
100.0000 mg | ORAL_CAPSULE | Freq: Two times a day (BID) | ORAL | Status: DC
  Administered 2020-10-29 – 2020-11-02 (×8): 100 mg via ORAL
  Filled 2020-10-29 (×8): qty 1

## 2020-10-29 MED ORDER — DILTIAZEM HCL ER COATED BEADS 120 MG PO CP24
240.0000 mg | ORAL_CAPSULE | Freq: Every day | ORAL | Status: DC
  Administered 2020-10-29 – 2020-10-30 (×2): 240 mg via ORAL
  Filled 2020-10-29: qty 1
  Filled 2020-10-29: qty 2
  Filled 2020-10-29: qty 1

## 2020-10-29 NOTE — ED Notes (Signed)
Patient assisted with brushing teeth, HOB adjusted, TV turned off per request and lights dimmed. Call bell remains within reach.

## 2020-10-29 NOTE — ED Notes (Signed)
MD notified of patient HR increasing intermittently to 120s. PRN metoprolol to be ordered.

## 2020-10-29 NOTE — ED Provider Notes (Signed)
[facility] Emergency Department Provider Note  ____________________________________________   Event Date/Time   First MD Initiated Contact with Patient 10/29/20 1326     (approximate)  I have reviewed the triage vital signs and the nursing notes.   HISTORY  Chief Complaint Atrial Fibrillation   HPI Shelly Ponce is a 87 y.o. female with a history of A. fib and other history as listed below presents to the emergency department for treatment and evaluation of shortness of breath.  Patient resides at [facility].  According to report from EMS, patient has had increasing in shortness of breath over the past 3 weeks.  EMS reports A. fib rate of 170 upon arrival.  She was given 3.5 mg of metoprolol.  She denies any known recent changes to her medications.  She takes Eliquis daily with the last dose being today.   She denies chest pain.  She does complain of intermittent headache.  She denies diaphoresis, nausea, peripheral edema, cough.   Past Medical History:  Diagnosis Date  . Absolute anemia 05/26/2015  . Acid reflux 05/26/2015  . Actinic keratosis   . Arthritis   . Atrial fibrillation, chronic ([place])   . Basal cell carcinoma 12/11/2008   right post neck  . Basal cell carcinoma 08/17/2009   left post lat base of neck  . Basal cell carcinoma 10/26/2012   left post base of neck. Excised 12/05/2012, margins free  . Basal cell carcinoma 09/27/2017   spinal mid back. [place], EXC 10/06/20  . Basal cell carcinoma 02/11/2019   right mid to upper back paraspinal. Nodular. EDC  . BP (high blood pressure) 05/26/2015  . Bradycardia   . CA of skin 05/26/2015  . Chronic constipation 05/26/2015  . Cyst and pseudocyst of pancreas 06/02/2013  . Edema leg 05/26/2015   Overview:  progressive   . Osteoarthritis   . Risk for falls   . Scoliosis   . Sepsis ([place]) 05/15/2015  . Squamous cell carcinoma of skin 03/25/2013   right prox dorsum forearm (in situ)  . Squamous cell  carcinoma of skin 10/25/2017   left sup lat forehead.   . Squamous cell carcinoma of skin 07/30/2019   right index finger. WD SCC. EDc.  Marland Kitchen Squamous cell carcinoma of skin 04/09/2020   right lower leg, anterior. WD SCC with superficial infiltration. EDC.    Patient Active Problem List   Diagnosis Date Noted  . Acute diastolic CHF (congestive heart failure) ([place]) 10/29/2020  . Protein-calorie malnutrition, severe 06/27/2017  . A-fib ([place]) 06/26/2017  . Absolute anemia 05/26/2015  . Chronic constipation 05/26/2015  . Acid reflux 05/26/2015  . BP (high blood pressure) 05/26/2015  . Edema leg 05/26/2015  . OP (osteoporosis) 05/26/2015  . Dupuytren's contracture of foot 05/26/2015  . CA of skin 05/26/2015  . Abdominal pain   . Sepsis ([place]) 05/15/2015  . UTI (lower urinary tract infection) 05/15/2015  . Bursitis, trochanteric 01/22/2015  . Degeneration of intervertebral disc of lumbar region 01/23/2014  . Neuritis or radiculitis due to rupture of lumbar intervertebral disc 01/22/2014  . Cyst and pseudocyst of pancreas 06/02/2013    Past Surgical History:  Procedure Laterality Date  . APPENDECTOMY  05/16/2015   Procedure: APPENDECTOMY;  Surgeon: Florene Glen, MD;  Location: [facility] ORS;  Service: General;;  . CATARACT EXTRACTION W/PHACO Left 11/23/2016   Procedure: CATARACT EXTRACTION PHACO AND INTRAOCULAR LENS PLACEMENT ([place]);  Surgeon: Estill Cotta, MD;  Location: [facility] ORS;  Service: Ophthalmology;  Laterality: Left;  [place] 00:58.4 AP% 22.4 CDE 22.49 Fluid Pack lot # [id number]  . COLONOSCOPY  2006  . HERNIA REPAIR Right 3/9/   inguinal hernia  . LAPAROTOMY N/A 05/16/2015   Procedure: EXPLORATORY LAPAROTOMY, DRAINAGE OF INTRABDOMINAL ABSCESS;  Surgeon: Florene Glen, MD;  Location: [facility] ORS;  Service: General;  Laterality: N/A;  . TONSILLECTOMY  1960  . TUBAL LIGATION  1978    Prior to Admission medications   Medication Sig Start Date End Date Taking? Authorizing  Provider  acetaminophen (TYLENOL) 325 MG tablet Take 650 mg by mouth every 4 (four) hours as needed.    [provider]  apixaban (ELIQUIS) 2.5 MG TABS tablet Take 1 tablet (2.5 mg total) by mouth 2 (two) times daily. 06/27/17   [name], MD  aspirin EC 81 MG tablet Take 81 mg by mouth daily.    [provider]  diltiazem (CARDIZEM CD) 240 MG 24 hr capsule Take 1 capsule (240 mg total) by mouth daily. 06/28/17   [name], MD  estradiol (ESTRACE) 0.5 MG tablet Take 0.5 mg by mouth daily.    [provider]  HYDROcodone-acetaminophen (NORCO/VICODIN) 5-325 MG tablet Take 1 tablet by mouth every 6 (six) hours as needed for moderate pain. Patient taking differently: Take 1 tablet by mouth 2 (two) times daily. 05/23/15   Clayburn Pert, MD  loperamide (IMODIUM) 2 MG capsule Take 2 mg by mouth as needed for diarrhea or loose stools.    [provider]  Melatonin 10 MG TABS Take 10 mg by mouth at bedtime as needed.    [provider]  Multiple Vitamins-Minerals (PRESERVISION AREDS PO) Take 1 capsule by mouth 2 (two) times daily.    [provider]  multivitamin-iron-minerals-folic acid (THERAPEUTIC-M) TABS tablet Take 1 tablet by mouth daily.    [provider]  mupirocin ointment (BACTROBAN) 2 % Apply 1 application topically daily. Qd to excision site 10/06/20   Ralene Bathe, MD  progesterone (PROMETRIUM) 100 MG capsule Take 100 mg by mouth daily. 15 DAYS PER MONTH    [provider]  promethazine (PHENERGAN) 25 MG suppository Place 25 mg rectally every 8 (eight) hours as needed for nausea or vomiting.    [provider]  senna-docusate (SENOKOT-S) 8.6-50 MG tablet Take 2 tablets by mouth at bedtime as needed for mild constipation.    [provider]  traZODone (DESYREL) 50 MG tablet Take 50 mg by mouth at bedtime.    [provider]  vitamin E 400 UNIT capsule Take 400 Units by  mouth daily.    [provider]    Allergies Macrobid [nitrofurantoin] and Sulfa antibiotics  Family History  Problem Relation Age of Onset  . Dementia Mother   . Leukemia Father     Social History Social History   Tobacco Use  . Smoking status: Never Smoker  . Smokeless tobacco: Never Used  Substance Use Topics  . Alcohol use: No  . Drug use: No    Review of Systems  Constitutional: No fever/chills. Eyes: No visual changes. ENT: No sore throat. Cardiovascular: Negative for chest pain.  Negative for pleuritic pain.  Negative for palpitations.  Negative for leg pain. Respiratory: Positive for shortness of breath. Gastrointestinal: Negative for abdominal pain.  No nausea, no vomiting.  No diarrhea.  No constipation. Genitourinary: Negative for dysuria. Musculoskeletal: Negative for back pain.  Skin: Negative for rash, lesion, wound. Neurological: Positive for intermittent headaches, focal weakness or numbness.  ____________________________________________   PHYSICAL EXAM:  VITAL SIGNS: ED Triage Vitals  Enc Vitals Group     BP 10/29/20 1326 (!) 116/91     Pulse Rate 10/29/20 1326 (!) 145     Resp 10/29/20 1326 20     Temp 10/29/20 1334 98.5 F (36.9 C)     Temp Source 10/29/20 1334 Oral     SpO2 10/29/20 1326 94 %     Weight 10/29/20 1326 99 lb 3.3 oz (45 kg)     Height 10/29/20 1326 5\' 1"  (1.549 m)     Head Circumference --      Peak Flow --      Pain Score 10/29/20 1326 0     Pain Loc --      Pain Edu? --      Excl. in [place]? --     Constitutional: Alert and oriented.  Overall well appearing and in no acute distress.  Baseline mental status. Eyes: Conjunctivae are normal. PERRL. Head: Atraumatic. Nose: No congestion/rhinnorhea. Mouth/Throat: Mucous membranes are moist.  Oropharynx non-erythematous. Tongue normal in size and color. Neck: No stridor.  No carotid bruit appreciated on exam. Hematological/Lymphatic/Immunilogical: No cervical  lymphadenopathy. Cardiovascular: Tachycardic good peripheral circulation. Respiratory: Normal respiratory effort.  No retractions. Lungs CTAB. Gastrointestinal: Soft and nontender. No distention. No abdominal bruits. No CVA tenderness. Genitourinary: Exam deferred. Musculoskeletal: No lower extremity tenderness.  No edema of extremities. Neurologic:  Normal speech and language. No gross focal neurologic deficits are appreciated. Skin:  Skin is warm, dry and intact. No rash noted. Psychiatric: Mood and affect are normal. Speech and behavior are normal.  ____________________________________________   LABS (all labs ordered are listed, but only abnormal results are displayed)  Labs Reviewed  CBC - Abnormal; Notable for the following components:      Result Value   RDW 16.4 (*)    All other components within normal limits  COMPREHENSIVE METABOLIC PANEL - Abnormal; Notable for the following components:   CO2 19 (*)    Glucose, Bld 135 (*)    Total Protein 6.3 (*)    AST 47 (*)    All other components within normal limits  BRAIN NATRIURETIC PEPTIDE  TROPONIN I (HIGH SENSITIVITY)  TROPONIN I (HIGH SENSITIVITY)   ____________________________________________  EKG  ED ECG REPORT I, Malvika Tung, FNP-BC personally viewed and interpreted this ECG.   Date: 10/29/2020  EKG Time: 1329  Rate: 136  Rhythm: atrial fibrillation, rate   Axis: normal  Intervals:none  ST&T Change: no ST elevation  ____________________________________________  RADIOLOGY  ED MD interpretation:  Bilateral pleural effusions.  I, Sherrie George, personally viewed and evaluated these images (plain radiographs) as part of my medical decision making, as well as reviewing the written report by the radiologist.  Official radiology report(s): DG Chest 2 View  Result Date: 10/29/2020 CLINICAL DATA:  Shortness of breath. EXAM: CHEST - 2 VIEW COMPARISON:  06/26/2017 FINDINGS: Normal sized heart. Interval  moderate-sized bilateral pleural effusions and bibasilar atelectasis. Thoracolumbar spine degenerative changes and scoliosis. IMPRESSION: Interval moderate-sized bilateral pleural effusions with bibasilar atelectasis. Electronically Signed   By: Claudie Revering M.D.   On: 10/29/2020 14:31    ____________________________________________   PROCEDURES  Procedure(s) performed: None  Procedures  Critical Care performed: Yes.  ____________________________________________   INITIAL IMPRESSION / ASSESSMENT AND PLAN   87 year old female presenting to the emergency department for treatment and evaluation of shortness of breath.  See HPI for further details.  She is currently in no acute  distress.  She currently denies chest pain.  She also denies headache at this time.  Family at bedside states that when she arrived to pick her up today, the patient stated that she felt she needed to go to the doctor.  Family member reports that this is very unusual for her to admit.  ____________________________________________  Differential diagnosis includes, but not limited to:  A fib RVR, cardiac event  ED COURSE  ----------------------------------------- 2:07 PM on 10/29/2020 -----------------------------------------  Rate remaining around 150. Metoprolol 5mg  ordered. BP stable. Awaiting labs and chest x-ray. Requiring 3l oxygen to keep oxygen saturation above 90%. She denies previous oxygen requirement.  ----------------------------------------- 2:16 PM on 10/29/2020 -----------------------------------------  Initial labs reassuring. Troponin normal. Discussed patient with Dr. Quentin Cornwall. Orders for cardizem placed as she is on cardizem daily.   ----------------------------------------- 3:19 PM on 10/29/2020 -----------------------------------------  Bilateral pleural effusions on chest x-ray, new since last available exam. ECHO in 2019 showed 60% EF.  Results discussed with patient. She  remains in afib, but now with controlled rate ranging between 65 and 99. Cardizem drip is infusing.   Discussed with hospitalist who accepts patient for admission.  CRITICAL CARE Performed by: Sherrie George   Total critical care time: 30 minutes  Critical care time was exclusive of separately billable procedures and treating other patients.  Critical care was necessary to treat or prevent imminent or life-threatening deterioration.  Critical care was time spent personally by me on the following activities: development of treatment plan with patient and/or surrogate as well as nursing, discussions with consultants, evaluation of patient's response to treatment, examination of patient, obtaining history from patient or surrogate, ordering and performing treatments and interventions, ordering and review of laboratory studies, ordering and review of radiographic studies, pulse oximetry and re-evaluation of patient's condition.      FINAL CLINICAL IMPRESSION(S) / ED DIAGNOSES  Final diagnoses:  Atrial fibrillation with RVR (HCC)  Hypoxia  Acute congestive heart failure, unspecified heart failure type [facility])     ED Discharge Orders    None       ZIERRA CLAIBORNE was evaluated in Emergency Department on 10/29/2020 for the symptoms described in the history of present illness. She was evaluated in the context of the global COVID-19 pandemic, which necessitated consideration that the patient might be at risk for infection with the SARS-CoV-2 virus that causes COVID-19. Institutional protocols and algorithms that pertain to the evaluation of patients at risk for COVID-19 are in a state of rapid change based on information released by regulatory bodies including the CDC and federal and state organizations. These policies and algorithms were followed during the patient's care in the ED.   Note:  This document was prepared using Dragon voice recognition software and may include unintentional  dictation errors.   Victorino Dike, FNP 10/29/20 1529    Merlyn Lot, MD 10/29/20 [phone]

## 2020-10-29 NOTE — ED Notes (Signed)
Patient awaiting transport.

## 2020-10-29 NOTE — H&P (Addendum)
History and Physical    Shelly Ponce [id number] DOB: 22-May-1933 DOA: 10/29/2020  PCP: Adin Hector, MD   Patient coming from: [facility] (assisted living facility)  I have personally briefly reviewed patient's old medical records in [place]  Chief Complaint: Shortness of breath  HPI: Shelly Ponce is a 87 y.o. female with medical history significant for chronic atrial fibrillation on anticoagulation therapy, GERD, anemia who presents to the ER via EMS for evaluation of shortness of breath mostly with exertion. Patient states she has had symptoms for over 2 weeks and notes that she has to rest after walking very short distances to catch her breath. On the day of admission she developed shortness of breath which woke her up out of her sleep.  She denies having any chest pain, no leg swelling, no orthopnea, no paroxysmal nocturnal dyspnea, no diaphoresis, no, no dizziness, no lightheadedness, no headache, no blurred vision, no abdominal pain, no changes in her bowel habits, no urinary symptoms, no fever or chills. Labs show sodium 139, potassium 4.9, chloride 109, bicarbonate 19, glucose 135, BUN 23, creatinine 0.90, calcium 9, albumin 3.8, alkaline phosphatase 52, AST 47, ALT 18, total protein 6.3, BNP 410, troponin 13, white count 7.4, hemoglobin 12.9, hematocrit 40.1, MCV 99.5, RDW 32.2, platelet count 222 Patient's SARS coronavirus 2 point-of-care test is pending Chest x-ray reviewed by me shows moderate-sized bilateral pleural effusions with bibasilar atelectasis. Twelve-lead EKG reviewed by me shows atrial fibrillation  ED Course: Patient is an 87 year old Caucasian female who was brought into the ER from the assisted living facility where she resides for evaluation of shortness of breath.  Per EMS she had a heart rate of about 170 bpm and was noted to be in rapid A. fib.  EMS administered 2.5 mg of metoprolol.  Upon arrival to the ER she remained tachycardic with heart  rate of 145 and was started on a Cardizem drip.  At the time of my evaluation she remains in A. fib but is rate controlled and is off the Cardizem drip.  Chest x-ray showed bilateral pleural effusions.  She received a dose of Lasix 20 mg IV.  She was noted to be hypoxic in the ER with pulse oximetry less than 88% and is currently on 3 L of oxygen with pulse oximetry of about 94%.  She will be admitted to the hospital for further evaluation.     Review of Systems: As per HPI otherwise all other systems reviewed and negative.    Past Medical History:  Diagnosis Date  . Absolute anemia 05/26/2015  . Acid reflux 05/26/2015  . Actinic keratosis   . Arthritis   . Atrial fibrillation, chronic ([place])   . Basal cell carcinoma 12/11/2008   right post neck  . Basal cell carcinoma 08/17/2009   left post lat base of neck  . Basal cell carcinoma 10/26/2012   left post base of neck. Excised 12/05/2012, margins free  . Basal cell carcinoma 09/27/2017   spinal mid back. [place], EXC 10/06/20  . Basal cell carcinoma 02/11/2019   right mid to upper back paraspinal. Nodular. EDC  . BP (high blood pressure) 05/26/2015  . Bradycardia   . CA of skin 05/26/2015  . Chronic constipation 05/26/2015  . Cyst and pseudocyst of pancreas 06/02/2013  . Edema leg 05/26/2015   Overview:  progressive   . Osteoarthritis   . Risk for falls   . Scoliosis   . Sepsis ([place]) 05/15/2015  .  Squamous cell carcinoma of skin 03/25/2013   right prox dorsum forearm (in situ)  . Squamous cell carcinoma of skin 10/25/2017   left sup lat forehead.   . Squamous cell carcinoma of skin 07/30/2019   right index finger. WD SCC. EDc.  Marland Kitchen Squamous cell carcinoma of skin 04/09/2020   right lower leg, anterior. WD SCC with superficial infiltration. EDC.    Past Surgical History:  Procedure Laterality Date  . APPENDECTOMY  05/16/2015   Procedure: APPENDECTOMY;  Surgeon: Florene Glen, MD;  Location: [facility] ORS;  Service: General;;  .  CATARACT EXTRACTION W/PHACO Left 11/23/2016   Procedure: CATARACT EXTRACTION PHACO AND INTRAOCULAR LENS PLACEMENT ([place]);  Surgeon: Estill Cotta, MD;  Location: [facility] ORS;  Service: Ophthalmology;  Laterality: Left;  [place] 00:58.4 AP% 22.4 CDE 22.49 Fluid Pack lot # [id number]  . COLONOSCOPY  2006  . HERNIA REPAIR Right 3/9/   inguinal hernia  . LAPAROTOMY N/A 05/16/2015   Procedure: EXPLORATORY LAPAROTOMY, DRAINAGE OF INTRABDOMINAL ABSCESS;  Surgeon: Florene Glen, MD;  Location: [facility] ORS;  Service: General;  Laterality: N/A;  . TONSILLECTOMY  1960  . TUBAL LIGATION  1978     reports that she has never smoked. She has never used smokeless tobacco. She reports that she does not drink alcohol and does not use drugs.  Allergies  Allergen Reactions  . Macrobid [Nitrofurantoin] Rash  . Sulfa Antibiotics Rash    Family History  Problem Relation Age of Onset  . Dementia Mother   . Leukemia Father       Prior to Admission medications   Medication Sig Start Date End Date Taking? Authorizing Provider  acetaminophen (TYLENOL) 500 MG tablet Take 500 mg by mouth every 8 (eight) hours as needed for mild pain.   Yes [provider]  apixaban (ELIQUIS) 2.5 MG TABS tablet Take 1 tablet (2.5 mg total) by mouth 2 (two) times daily. 06/27/17  Yes [name], MD  carbamide peroxide (DEBROX) 6.5 % OTIC solution Place 5 drops into both ears 2 (two) times daily. 10/27/20 11/05/20 Yes [provider]  cholecalciferol (VITAMIN D3) 25 MCG (1000 UNIT) tablet Take 1,000 Units by mouth daily.   Yes [provider]  diclofenac Sodium (VOLTAREN) 1 % GEL Apply 2 g topically 3 (three) times daily as needed (pain).   Yes [provider]  docusate sodium (COLACE) 100 MG capsule Take 100 mg by mouth 2 (two) times daily.   Yes [provider]  estradiol (ESTRACE) 0.5 MG tablet Take 0.5 mg by mouth daily.   Yes [provider]  HYDROcodone-acetaminophen  (NORCO) 10-325 MG tablet Take 1 tablet by mouth 3 (three) times daily.   Yes [provider]  hydrocortisone cream 1 % Apply 1 application topically daily as needed for itching.   Yes [provider]  ketoconazole (NIZORAL) 2 % cream Apply 1 application topically 2 (two) times daily as needed for irritation.   Yes [provider]  Multiple Vitamins-Minerals (PRESERVISION AREDS PO) Take 1 capsule by mouth 2 (two) times daily.   Yes [provider]  multivitamin-iron-minerals-folic acid (THERAPEUTIC-M) TABS tablet Take 1 tablet by mouth daily.   Yes [provider]  ondansetron (ZOFRAN) 4 MG tablet Take 4 mg by mouth every 6 (six) hours as needed for nausea or vomiting.   Yes [provider]  pantoprazole (PROTONIX) 40 MG tablet Take 40 mg by mouth daily.   Yes [provider]  progesterone (PROMETRIUM) 100 MG capsule  Take 100 mg by mouth daily. (only 15 days per month)   Yes [provider]  senna-docusate (SENOKOT-S) 8.6-50 MG tablet Take 1 tablet by mouth daily.   Yes [provider]  traZODone (DESYREL) 50 MG tablet Take 50 mg by mouth at bedtime.   Yes [provider]  triamcinolone cream (KENALOG) 0.1 % Apply 1 application topically 2 (two) times daily as needed (skin irritations).   Yes [provider]  diltiazem (CARDIZEM CD) 240 MG 24 hr capsule Take 1 capsule (240 mg total) by mouth daily. Patient not taking: No sig reported 06/28/17   [name], MD  mupirocin ointment (BACTROBAN) 2 % Apply 1 application topically daily. Qd to excision site Patient not taking: No sig reported 10/06/20   Ralene Bathe, MD    Physical Exam: Vitals:   10/29/20 1530 10/29/20 1532 10/29/20 1535 10/29/20 1600  BP: 114/80   101/81  Pulse: 78 86 63   Resp: 20 16 17 19   Temp:      TempSrc:      SpO2: 94% 97% 96% 95%  Weight:      Height:         Vitals:   10/29/20 1530 10/29/20 1532 10/29/20  1535 10/29/20 1600  BP: 114/80   101/81  Pulse: 78 86 63   Resp: 20 16 17 19   Temp:      TempSrc:      SpO2: 94% 97% 96% 95%  Weight:      Height:          Constitutional: Alert and oriented x 3 . Not in any apparent distress.  Thin and frail HEENT:      Head: Normocephalic and atraumatic.         Eyes: PERLA, EOMI, Conjunctivae are normal. Sclera is non-icteric.       Mouth/Throat: Mucous membranes are moist.       Neck: Supple with no signs of meningismus. Cardiovascular:  Irregularly irregular. No murmurs, gallops, or rubs. 2+ symmetrical distal pulses are present . No JVD. No LE edema Respiratory: Respiratory effort normal .bibasilar crackles in both lower lung fields,  No wheezes or rhonchi.  Gastrointestinal: Soft, non tender, and non distended with positive bowel sounds.  Genitourinary: No CVA tenderness. Musculoskeletal: Nontender with normal range of motion in all extremities. No cyanosis, or erythema of extremities. Neurologic:  Face is symmetric. Moving all extremities. No gross focal neurologic deficits  Skin: Skin is warm, dry.  No rash or ulcers Psychiatric: Mood and affect are normal   Labs on Admission: I have personally reviewed following labs and imaging studies  CBC: Recent Labs  Lab 10/29/20 1331  WBC 7.4  HGB 12.9  HCT 40.1  MCV 99.5  PLT 732   Basic Metabolic Panel: Recent Labs  Lab 10/29/20 1331  NA 139  K 4.9  CL 109  CO2 19*  GLUCOSE 135*  BUN 23  CREATININE 0.90  CALCIUM 9.0   GFR: Estimated Creatinine Clearance: 32.5 mL/min (by C-G formula based on SCr of 0.9 mg/dL). Liver Function Tests: Recent Labs  Lab 10/29/20 1331  AST 47*  ALT 18  ALKPHOS 52  BILITOT 1.2  PROT 6.3*  ALBUMIN 3.8   No results for input(s): LIPASE, AMYLASE in the last 168 hours. No results for input(s): AMMONIA in the last 168 hours. Coagulation Profile: No results for input(s): INR, PROTIME in the last 168 hours. Cardiac Enzymes: No results for  input(s): CKTOTAL, CKMB, CKMBINDEX, TROPONINI in  the last 168 hours. BNP (last 3 results) No results for input(s): PROBNP in the last 8760 hours. HbA1C: No results for input(s): HGBA1C in the last 72 hours. CBG: No results for input(s): GLUCAP in the last 168 hours. Lipid Profile: No results for input(s): CHOL, HDL, LDLCALC, TRIG, CHOLHDL, LDLDIRECT in the last 72 hours. Thyroid Function Tests: No results for input(s): TSH, T4TOTAL, FREET4, T3FREE, THYROIDAB in the last 72 hours. Anemia Panel: No results for input(s): VITAMINB12, FOLATE, FERRITIN, TIBC, IRON, RETICCTPCT in the last 72 hours. Urine analysis:    Component Value Date/Time   COLORURINE AMBER (A) 06/26/2017 1251   APPEARANCEUR HAZY (A) 06/26/2017 1251   LABSPEC 1.032 (H) 06/26/2017 1251   PHURINE 5.0 06/26/2017 1251   GLUCOSEU NEGATIVE 06/26/2017 1251   HGBUR SMALL (A) 06/26/2017 1251   BILIRUBINUR NEGATIVE 06/26/2017 1251   KETONESUR 5 (A) 06/26/2017 1251   PROTEINUR 30 (A) 06/26/2017 1251   NITRITE NEGATIVE 06/26/2017 1251   LEUKOCYTESUR MODERATE (A) 06/26/2017 1251    Radiological Exams on Admission: DG Chest 2 View  Result Date: 10/29/2020 CLINICAL DATA:  Shortness of breath. EXAM: CHEST - 2 VIEW COMPARISON:  06/26/2017 FINDINGS: Normal sized heart. Interval moderate-sized bilateral pleural effusions and bibasilar atelectasis. Thoracolumbar spine degenerative changes and scoliosis. IMPRESSION: Interval moderate-sized bilateral pleural effusions with bibasilar atelectasis. Electronically Signed   By: Claudie Revering M.D.   On: 10/29/2020 14:31     Assessment/Plan Principal Problem:   Acute diastolic CHF (congestive heart failure) (HCC) Active Problems:   Acid reflux   Atrial fibrillation, chronic (HCC)   Acute respiratory failure (HCC)      Acute diastolic dysfunction CHF Most likely rate induced as patient presented with rapid A. Fib Last 2D echocardiogram from 01/19 showed an LVEF of 55 to 60% We will  place patient on Lasix 40 mg IV daily Continue Cardizem We will repeat 2D echocardiogram to assess LVEF    Acute respiratory failure Secondary to acute diastolic dysfunction CHF At rest patient had room air pulse oximetry of about 87% and is currently on 3 L of oxygen to keep pulse oximetry greater than 92% Will attempt to wean patient off oxygen as tolerated   Chronic atrial fibrillation Initially was in rapid A. fib upon arrival to the ER responded to Cardizem drip She is currently off the Cardizem drip with heart rate in the 80s Continue Eliquis as primary prophylaxis for an acute stroke   GERD Continue Protonix  DVT prophylaxis: Apixaban Code Status: DO NOT RESUSCITATE Family Communication: Greater than 50% of time was spent discussing patient's condition and plan of care with her and her healthcare power of attorney, Ms. Humberto Leep at the bedside.  All questions and concerns have been addressed.  CODE STATUS was discussed and patient's request to be a DO NOT RESUSCITATE.  They verbalized understanding and agreed with the treatment plan. Disposition Plan: Back to previous home environment Consults called: none Status: At time of admission, it appears that the appropriate admission status for this patient is inpatient. This is judged to be reasonable and necessary in order to provide the required intensity of service to ensure the patient's safety given the presenting symptoms, physical exam findings and initial radiographic and laboratory data in the context of their co morbid conditions. Patient requires inpatient status due to high intensity of service, high risk for further deterioration and high frequency of surveillance required     Collier Bullock MD [facility]     10/29/2020, 4:54  PM

## 2020-10-29 NOTE — ED Triage Notes (Signed)
Patient presents to ER from [facility]. EMS called out for shortness of breath with exertion for 3 weeks. Worsening today. Patient noted to be in Afib 170s with EMS. Patient given 3.5 mg metoprolol with EMS. Patient reports she takes eliquis for afib. Patient A&OX3.

## 2020-10-30 ENCOUNTER — Inpatient Hospital Stay
Admit: 2020-10-30 | Discharge: 2020-10-30 | Disposition: A | Discharge status: Home / Self Care | Payer: Medicare Other | Attending: Internal Medicine | Admitting: Internal Medicine

## 2020-10-30 ENCOUNTER — Other Ambulatory Visit: Payer: Self-pay

## 2020-10-30 ENCOUNTER — Inpatient Hospital Stay: Payer: Medicare Other

## 2020-10-30 ENCOUNTER — Encounter: Payer: Self-pay | Admitting: Internal Medicine

## 2020-10-30 DIAGNOSIS — I482 Chronic atrial fibrillation, unspecified: Secondary | ICD-10-CM

## 2020-10-30 DIAGNOSIS — K219 Gastro-esophageal reflux disease without esophagitis: Secondary | ICD-10-CM

## 2020-10-30 DIAGNOSIS — R531 Weakness: Secondary | ICD-10-CM

## 2020-10-30 DIAGNOSIS — J90 Pleural effusion, not elsewhere classified: Secondary | ICD-10-CM

## 2020-10-30 DIAGNOSIS — J9601 Acute respiratory failure with hypoxia: Secondary | ICD-10-CM

## 2020-10-30 LAB — BODY FLUID CELL COUNT WITH DIFFERENTIAL
Eos, Fluid: 0 %
Lymphs, Fluid: 52 %
Monocyte-Macrophage-Serous Fluid: 36 %
Neutrophil Count, Fluid: 12 %
Total Nucleated Cell Count, Fluid: 179 cu mm

## 2020-10-30 LAB — BASIC METABOLIC PANEL
Anion gap: 9 (ref 5–15)
BUN: 25 mg/dL — ABNORMAL HIGH (ref 8–23)
CO2: 24 mmol/L (ref 22–32)
Calcium: 8.3 mg/dL — ABNORMAL LOW (ref 8.9–10.3)
Chloride: 109 mmol/L (ref 98–111)
Creatinine, Ser: 0.92 mg/dL (ref 0.44–1.00)
GFR, Estimated: >60 mL/min (ref >60)
Glucose, Bld: 94 mg/dL (ref 70–99)
Potassium: 3.6 mmol/L (ref 3.5–5.1)
Sodium: 142 mmol/L (ref 135–145)

## 2020-10-30 LAB — ALBUMIN, PLEURAL OR PERITONEAL FLUID: Albumin, Fluid: 1.3 g/dL

## 2020-10-30 LAB — ECHOCARDIOGRAM COMPLETE
AR max vel: 1.74 cm2
AV Area VTI: 1.85 cm2
AV Area mean vel: 1.7 cm2
AV Mean grad: 1 mmHg
AV Peak grad: 2.4 mmHg
Ao pk vel: 0.78 m/s
Area-P 1/2: 5.16 cm2
Height: 61 in
MV VTI: 1.16 cm2
S' Lateral: 3.3 cm
Weight: 1619.2 oz

## 2020-10-30 LAB — GLUCOSE, PLEURAL OR PERITONEAL FLUID: Glucose, Fluid: 106 mg/dL

## 2020-10-30 LAB — PROTEIN, PLEURAL OR PERITONEAL FLUID: Total protein, fluid: <3 g/dL

## 2020-10-30 LAB — SARS CORONAVIRUS 2 (TAT 6-24 HRS): SARS Coronavirus 2: NEGATIVE

## 2020-10-30 MED ORDER — PROGESTERONE MICRONIZED 100 MG PO CAPS
100.0000 mg | ORAL_CAPSULE | Freq: Every day | ORAL | Status: DC
  Administered 2020-10-30 – 2020-11-02 (×4): 100 mg via ORAL
  Filled 2020-10-30 (×4): qty 1

## 2020-10-30 MED ORDER — METOPROLOL SUCCINATE ER 50 MG PO TB24
50.0000 mg | ORAL_TABLET | Freq: Every day | ORAL | Status: DC
  Filled 2020-10-30: qty 1

## 2020-10-30 MED ORDER — METOPROLOL SUCCINATE ER 25 MG PO TB24
25.0000 mg | ORAL_TABLET | Freq: Every day | ORAL | Status: DC
  Administered 2020-10-30: 25 mg via ORAL
  Filled 2020-10-30: qty 1

## 2020-10-30 MED ORDER — METOPROLOL SUCCINATE ER 50 MG PO TB24: 50.0000 mg | ORAL_TABLET | Freq: Every day | ORAL | Status: DC

## 2020-10-30 NOTE — Consult Note (Signed)
[facility] Consultation Note  Patient ID: Shelly Ponce, MRN: [id number], DOB/AGE: Jul 22, 1932 87 y.o. Admit date: 10/29/2020   Date of Consult: 10/30/2020 Primary Physician: Adin Hector, MD Primary Cardiologist: Nehemiah Massed  Chief Complaint:  Chief Complaint  Patient presents with  . Atrial Fibrillation   Reason for Consult: Heart failure  HPI: 87 y.o. female with known chronic nonvalvular atrial fibrillation hypertension and abnormal EKG with acute diastolic dysfunction congestive heart failure due to significant progression of pleural effusions and respiratory failure with hypoxia.  The patient was seen in the emergency room for significant amount of shortness of breath PND orthopnea and was seen to have a significant left pleural effusion and mild right pleural effusion.  There was hypoxia and the patient was given diuretics with have some improvements.  EKG shows atrial fibrillation with controlled ventricular rate and nonspecific intraventricular conduction defect.  The patient received left thoracentesis with significant improvement in oxygenation and the patient feels much better.  Intravenous diuresis has been occurring as well for which the patient feels much better.  Metoprolol has been continued for heart rate control of atrial fibrillation with a heart rate of 100 bpm.  In addition to that the patient was remaining on Eliquis for further risk reduction stroke with atrial fibrillation and stable without bleeding complications.  There is no evidence of acute coronary syndrome or myocardial infarction at this time  Past Medical History:  Diagnosis Date  . Absolute anemia 05/26/2015  . Acid reflux 05/26/2015  . Actinic keratosis   . Arthritis   . Atrial fibrillation, chronic ([place])   . Basal cell carcinoma 12/11/2008   right post neck  . Basal cell carcinoma 08/17/2009   left post lat base of neck  . Basal cell carcinoma 10/26/2012   left post base of neck. Excised  12/05/2012, margins free  . Basal cell carcinoma 09/27/2017   spinal mid back. [place], EXC 10/06/20  . Basal cell carcinoma 02/11/2019   right mid to upper back paraspinal. Nodular. EDC  . BP (high blood pressure) 05/26/2015  . Bradycardia   . CA of skin 05/26/2015  . Chronic constipation 05/26/2015  . Cyst and pseudocyst of pancreas 06/02/2013  . Edema leg 05/26/2015   Overview:  progressive   . Osteoarthritis   . Risk for falls   . Scoliosis   . Sepsis ([place]) 05/15/2015  . Squamous cell carcinoma of skin 03/25/2013   right prox dorsum forearm (in situ)  . Squamous cell carcinoma of skin 10/25/2017   left sup lat forehead.   . Squamous cell carcinoma of skin 07/30/2019   right index finger. WD SCC. EDc.  Marland Kitchen Squamous cell carcinoma of skin 04/09/2020   right lower leg, anterior. WD SCC with superficial infiltration. EDC.      Surgical History:  Past Surgical History:  Procedure Laterality Date  . APPENDECTOMY  05/16/2015   Procedure: APPENDECTOMY;  Surgeon: Florene Glen, MD;  Location: [facility] ORS;  Service: General;;  . CATARACT EXTRACTION W/PHACO Left 11/23/2016   Procedure: CATARACT EXTRACTION PHACO AND INTRAOCULAR LENS PLACEMENT ([place]);  Surgeon: Estill Cotta, MD;  Location: [facility] ORS;  Service: Ophthalmology;  Laterality: Left;  [place] 00:58.4 AP% 22.4 CDE 22.49 Fluid Pack lot # [id number]  . COLONOSCOPY  2006  . HERNIA REPAIR Right 3/9/   inguinal hernia  . LAPAROTOMY N/A 05/16/2015   Procedure: EXPLORATORY LAPAROTOMY, DRAINAGE OF INTRABDOMINAL ABSCESS;  Surgeon: Florene Glen, MD;  Location: [facility] ORS;  Service: General;  Laterality: N/A;  . TONSILLECTOMY  1960  . TUBAL LIGATION  1978     Home Meds: Prior to Admission medications   Medication Sig Start Date End Date Taking? Authorizing Provider  acetaminophen (TYLENOL) 500 MG tablet Take 500 mg by mouth every 8 (eight) hours as needed for mild pain.   Yes [provider]  apixaban (ELIQUIS) 2.5 MG TABS tablet  Take 1 tablet (2.5 mg total) by mouth 2 (two) times daily. 06/27/17  Yes [name], MD  carbamide peroxide (DEBROX) 6.5 % OTIC solution Place 5 drops into both ears 2 (two) times daily. 10/27/20 11/05/20 Yes [provider]  cholecalciferol (VITAMIN D3) 25 MCG (1000 UNIT) tablet Take 1,000 Units by mouth daily.   Yes [provider]  diclofenac Sodium (VOLTAREN) 1 % GEL Apply 2 g topically 3 (three) times daily as needed (pain).   Yes [provider]  docusate sodium (COLACE) 100 MG capsule Take 100 mg by mouth 2 (two) times daily.   Yes [provider]  estradiol (ESTRACE) 0.5 MG tablet Take 0.5 mg by mouth daily.   Yes [provider]  HYDROcodone-acetaminophen (NORCO) 10-325 MG tablet Take 1 tablet by mouth 3 (three) times daily.   Yes [provider]  hydrocortisone cream 1 % Apply 1 application topically daily as needed for itching.   Yes [provider]  ketoconazole (NIZORAL) 2 % cream Apply 1 application topically 2 (two) times daily as needed for irritation.   Yes [provider]  Multiple Vitamins-Minerals (PRESERVISION AREDS PO) Take 1 capsule by mouth 2 (two) times daily.   Yes [provider]  multivitamin-iron-minerals-folic acid (THERAPEUTIC-M) TABS tablet Take 1 tablet by mouth daily.   Yes [provider]  ondansetron (ZOFRAN) 4 MG tablet Take 4 mg by mouth every 6 (six) hours as needed for nausea or vomiting.   Yes [provider]  pantoprazole (PROTONIX) 40 MG tablet Take 40 mg by mouth daily.   Yes [provider]  progesterone (PROMETRIUM) 100 MG capsule Take 100 mg by mouth daily. Take 100 mg (1 capsule) by mouth daily for 15 days. Do not take for 15 more days. Then start taking again for 15 days.   Yes [provider]  senna-docusate (SENOKOT-S) 8.6-50 MG tablet Take 1 tablet by mouth daily.   Yes [provider]  traZODone (DESYREL) 50 MG tablet  Take 50 mg by mouth at bedtime.   Yes [provider]  triamcinolone cream (KENALOG) 0.1 % Apply 1 application topically 2 (two) times daily as needed (skin irritations).   Yes [provider]  diltiazem (CARDIZEM CD) 240 MG 24 hr capsule Take 1 capsule (240 mg total) by mouth daily. Patient not taking: No sig reported 06/28/17   [name], MD  mupirocin ointment (BACTROBAN) 2 % Apply 1 application topically daily. Qd to excision site Patient not taking: No sig reported 10/06/20   Ralene Bathe, MD    Inpatient Medications:  . apixaban  2.5 mg Oral BID  . cholecalciferol  1,000 Units Oral Daily  . docusate sodium  100 mg Oral BID  . estradiol  0.5 mg Oral Daily  . furosemide  40 mg Intravenous Daily  . HYDROcodone-acetaminophen  1 tablet Oral TID  . metoprolol succinate  25 mg Oral Daily  . multivitamin-lutein   Oral BID  . pantoprazole  40 mg Oral Daily  . progesterone  100 mg Oral Daily  . senna-docusate  1 tablet Oral  Daily  . sodium chloride flush  3 mL Intravenous Q12H  . traZODone  50 mg Oral QHS   . sodium chloride      Allergies:  Allergies  Allergen Reactions  . Macrobid [Nitrofurantoin] Rash  . Sulfa Antibiotics Rash    Social History   Socioeconomic History  . Marital status: Widowed    Spouse name: Not on file  . Number of children: Not on file  . Years of education: Not on file  . Highest education level: Not on file  Occupational History  . Not on file  Tobacco Use  . Smoking status: Never Smoker  . Smokeless tobacco: Never Used  Substance and Sexual Activity  . Alcohol use: No  . Drug use: No  . Sexual activity: Not on file  Other Topics Concern  . Not on file  Social History Narrative  . Not on file   Social Determinants of Health   Financial Resource Strain: Not on file  Food Insecurity: Not on file  Transportation Needs: Not on file  Physical Activity: Not on file  Stress: Not on file  Social Connections:  Not on file  Intimate Partner Violence: Not on file     Family History  Problem Relation Age of Onset  . Dementia Mother   . Leukemia Father      Review of Systems Positive for shortness of breath Negative for: General:  chills, fever, night sweats or weight changes.  Cardiovascular: PND orthopnea syncope dizziness  Dermatological skin lesions rashes Respiratory: Cough congestion Urologic: Frequent urination urination at night and hematuria Abdominal: negative for nausea, vomiting, diarrhea, bright red blood per rectum, melena, or hematemesis Neurologic: negative for visual changes, and/or hearing changes  All other systems reviewed and are otherwise negative except as noted above.  Labs: No results for input(s): CKTOTAL, CKMB, TROPONINI in the last 72 hours. Lab Results  Component Value Date   WBC 7.4 10/29/2020   HGB 12.9 10/29/2020   HCT 40.1 10/29/2020   MCV 99.5 10/29/2020   PLT 222 10/29/2020    Recent Labs  Lab 10/29/20 1331 10/30/20 0440  NA 139 142  K 4.9 3.6  CL 109 109  CO2 19* 24  BUN 23 25*  CREATININE 0.90 0.92  CALCIUM 9.0 8.3*  PROT 6.3*  --   BILITOT 1.2  --   ALKPHOS 52  --   ALT 18  --   AST 47*  --   GLUCOSE 135* 94   No results found for: CHOL, HDL, LDLCALC, TRIG No results found for: DDIMER  Radiology/Studies:  DG Chest 1 View  Result Date: 10/30/2020 CLINICAL DATA:  Left thoracentesis EXAM: CHEST  1 VIEW COMPARISON:  Yesterday FINDINGS: Essentially resolved left pleural effusion after thoracentesis. No pneumothorax or re-expansion edema. When allowing for layering, presumably similar right pleural effusion. Cardiomegaly. IMPRESSION: Left thoracentesis without complicating feature. Electronically Signed   By: Monte Fantasia M.D.   On: 10/30/2020 11:04   DG Chest 2 View  Result Date: 10/29/2020 CLINICAL DATA:  Shortness of breath. EXAM: CHEST - 2 VIEW COMPARISON:  06/26/2017 FINDINGS: Normal sized heart. Interval moderate-sized  bilateral pleural effusions and bibasilar atelectasis. Thoracolumbar spine degenerative changes and scoliosis. IMPRESSION: Interval moderate-sized bilateral pleural effusions with bibasilar atelectasis. Electronically Signed   By: Claudie Revering M.D.   On: 10/29/2020 14:31   ECHOCARDIOGRAM COMPLETE  Result Date: 10/30/2020    ECHOCARDIOGRAM REPORT   Patient Name:   Shelly Ponce Pitter Date of Exam: 10/30/2020 Medical  Rec #:  [id number]      Height:       61.0 in Accession #:    [id number]     Weight:       101.2 lb Date of Birth:  1933-03-05      BSA:          1.414 m Patient Age:    71 years       BP:           102/74 mmHg Patient Gender: F              HR:           75 bpm. Exam Location:  [facility] Procedure: 2D Echo, Color Doppler and Cardiac Doppler Indications:     I50.31 CHF-Acute Diastolic  History:         Patient has prior history of Echocardiogram examinations.                  Arrythmias:Atrial Fibrillation.  Sonographer:     Charmayne Sheer RDCS (AE) Referring Phys:  [id number] Collier Bullock Diagnosing Phys: Serafina Royals MD  Sonographer Comments: Suboptimal subcostal window. IMPRESSIONS  1. Left ventricular ejection fraction, by estimation, is 40 to 45%. The left ventricle has mildly decreased function. The left ventricle demonstrates global hypokinesis. The left ventricular internal cavity size was mildly dilated. Left ventricular diastolic parameters were normal.  2. Right ventricular systolic function is normal. The right ventricular size is normal.  3. Left atrial size was moderately dilated.  4. Right atrial size was moderately dilated.  5. The mitral valve is normal in structure. Mild mitral valve regurgitation.  6. The aortic valve is normal in structure. Aortic valve regurgitation is trivial. FINDINGS  Left Ventricle: Left ventricular ejection fraction, by estimation, is 40 to 45%. The left ventricle has mildly decreased function. The left ventricle demonstrates global hypokinesis. The left ventricular  internal cavity size was mildly dilated. There is  no left ventricular hypertrophy. Left ventricular diastolic parameters were normal. Right Ventricle: The right ventricular size is normal. No increase in right ventricular wall thickness. Right ventricular systolic function is normal. Left Atrium: Left atrial size was moderately dilated. Right Atrium: Right atrial size was moderately dilated. Pericardium: There is no evidence of pericardial effusion. Mitral Valve: The mitral valve is normal in structure. Mild mitral valve regurgitation. MV peak gradient, 4.0 mmHg. The mean mitral valve gradient is 1.0 mmHg. Tricuspid Valve: The tricuspid valve is normal in structure. Tricuspid valve regurgitation is mild. Aortic Valve: The aortic valve is normal in structure. Aortic valve regurgitation is trivial. Aortic valve mean gradient measures 1.0 mmHg. Aortic valve peak gradient measures 2.4 mmHg. Aortic valve area, by VTI measures 1.85 cm. Pulmonic Valve: The pulmonic valve was not assessed. Pulmonic valve regurgitation is not visualized. Aorta: The aortic root and ascending aorta are structurally normal, with no evidence of dilitation. IAS/Shunts: No atrial level shunt detected by color flow Doppler.  LEFT VENTRICLE PLAX 2D LVIDd:         3.70 cm  Diastology LVIDs:         3.30 cm  LV e' medial:    6.20 cm/s LV PW:         1.00 cm  LV E/e' medial:  14.4 LV IVS:        0.70 cm  LV e' lateral:   9.79 cm/s LVOT diam:     1.70 cm  LV E/e' lateral: 9.1 LV SV:  25 LV SV Index:   18 LVOT Area:     2.27 cm  RIGHT VENTRICLE RV Basal diam:  3.00 cm LEFT ATRIUM             Index       RIGHT ATRIUM           Index LA diam:        3.50 cm 2.48 cm/m  RA Area:     24.00 cm LA Vol (A2C):   52.6 ml 37.20 ml/m RA Volume:   77.80 ml  55.02 ml/m LA Vol (A4C):   63.7 ml 45.05 ml/m LA Biplane Vol: 58.1 ml 41.09 ml/m  AORTIC VALVE                   PULMONIC VALVE AV Area (Vmax):    1.74 cm    PV Vmax:       0.54 m/s AV Area  (Vmean):   1.70 cm    PV Vmean:      37.400 cm/s AV Area (VTI):     1.85 cm    PV VTI:        0.092 m AV Vmax:           77.60 cm/s  PV Peak grad:  1.2 mmHg AV Vmean:          57.500 cm/s PV Mean grad:  1.0 mmHg AV VTI:            0.135 m AV Peak Grad:      2.4 mmHg AV Mean Grad:      1.0 mmHg LVOT Vmax:         59.40 cm/s LVOT Vmean:        43.100 cm/s LVOT VTI:          0.110 m LVOT/AV VTI ratio: 0.81  AORTA Ao Root diam: 3.30 cm MITRAL VALVE               TRICUSPID VALVE MV Area (PHT): 5.16 cm    TR Peak grad:   23.2 mmHg MV Area VTI:   1.16 cm    TR Vmax:        241.00 cm/s MV Peak grad:  4.0 mmHg MV Mean grad:  1.0 mmHg    SHUNTS MV Vmax:       1.00 m/s    Systemic VTI:  0.11 m MV Vmean:      50.2 cm/s   Systemic Diam: 1.70 cm MV Decel Time: 147 msec MV E velocity: 89.53 cm/s Serafina Royals MD Electronically signed by Serafina Royals MD Signature Date/Time: 10/30/2020/3:08:54 PM    Final    US THORACENTESIS ASP PLEURAL SPACE W/IMG GUIDE  Result Date: 10/30/2020 INDICATION: Patient admitted for shortness of breath and atrial fibrillation with rapid ventricular rate. Patient found to have bilateral pleural effusions and Interventional Radiology has been asked to perform a therapeutic and diagnostic thoracentesis. EXAM: ULTRASOUND GUIDED THORACENTESIS MEDICATIONS: 1% lidocaine 10 mL COMPLICATIONS: None immediate. PROCEDURE: An ultrasound guided thoracentesis was thoroughly discussed with the patient and questions answered. The benefits, risks, alternatives and complications were also discussed. The patient understands and wishes to proceed with the procedure. Written consent was obtained. Ultrasound was performed to localize and mark an adequate pocket of fluid in the left chest. The area was then prepped and draped in the normal sterile fashion. 1% Lidocaine was used for local anesthesia. Under ultrasound guidance a 6 Fr Safe-T-Centesis catheter was introduced. Thoracentesis was performed. The catheter  was  removed and a dressing applied. FINDINGS: A total of approximately 550 mL of clear yellow fluid was removed. Samples were sent to the laboratory as requested by the clinical team. IMPRESSION: Successful ultrasound guided left thoracentesis yielding 550 mL of pleural fluid. Read by: Soyla Dryer, NP Electronically Signed   By: Ruthann Cancer MD   On: 10/30/2020 12:03    EKG: Atrial fibrillation with controlled ventricular rate nonspecific ST and T wave changes  Weights: Filed Weights   10/29/20 1326 10/30/20 0500  Weight: 45 kg 45.9 kg     Physical Exam: Blood pressure (!) 89/62, pulse 82, temperature 98.5 F (36.9 C), resp. rate 16, height 5\' 1"  (1.549 m), weight 45.9 kg, SpO2 94 %. Body mass index is 19.12 kg/m. General: Well developed, well nourished, in no acute distress. Head eyes ears nose throat: Normocephalic, atraumatic, sclera non-icteric, no xanthomas, nares are without discharge. No apparent thyromegaly and/or mass  Lungs: Normal respiratory effort.  Few wheezes, no rales, no rhonchi.  Right basilar decreased breath sounds Heart: Irregular with normal S1 S2. no murmur gallop, no rub, PMI is normal size and placement, carotid upstroke normal without bruit, jugular venous pressure is normal Abdomen: Soft, non-tender, non-distended with normoactive bowel sounds. No hepatomegaly. No rebound/guarding. No obvious abdominal masses. Abdominal aorta is normal size without bruit Extremities: Trace edema. no cyanosis, no clubbing, no ulcers  Peripheral : 2+ bilateral upper extremity pulses, 2+ bilateral femoral pulses, 2+ bilateral dorsal pedal pulse Neuro: Alert and oriented. No facial asymmetry. No focal deficit. Moves all extremities spontaneously. Musculoskeletal: Normal muscle tone without kyphosis Psych:  Responds to questions appropriately with a normal affect.    Assessment: 87 year old female with chronic nonvalvular atrial fibrillation with acute diastolic and/or systolic  dysfunction congestive heart failure with pleural effusion without evidence of myocardial infarction  Plan: 1.  Continue intravenous diuresis over the next 24 hours and change to oral furosemide for discharge to home at 40 mg each day 2.  Metoprolol for heart rate control of atrial fibrillation increasing dosage to 50 mg each day today 3.  Continue anticoagulation for further risk reduction and stroke with atrial fibrillation 4.  No further thoracentesis on the right due to improvements significantly on the left and oxygenation and improvement 5.  No further diagnostic testing necessary at this time 6.  Begin ambulation and follow-up for improvements of symptoms and possible discharge to home if ambulating well  Signed, Corey Skains M.D. [facility] 10/30/2020, 3:32 PM

## 2020-10-30 NOTE — Procedures (Signed)
PROCEDURE SUMMARY:  Successful US guided left thoracentesis. Yielded 550 ml of clear yellow fluid. Pt tolerated procedure well. No immediate complications.  Specimen sent for labs. CXR ordered; no post-procedure pneumothorax identified  EBL < 2 mL  Theresa Duty, NP 10/30/2020 12:01 PM

## 2020-10-30 NOTE — Progress Notes (Signed)
*  PRELIMINARY RESULTS* Echocardiogram 2D Echocardiogram has been performed.  Shelly Ponce Shelly Ponce 10/30/2020, 12:01 PM

## 2020-10-30 NOTE — Evaluation (Signed)
Physical Therapy Evaluation Patient Details Name: Shelly Ponce MRN: [id number] DOB: 1933/04/01 Today's Date: 10/30/2020   History of Present Illness  Pt is an 87 y/o F admitted on 10/29/20 with c/c of SOB. Chest x-ray revealed moderate size B pleural effusions with bibasilar atelectasis. PMH: chronic a-fib on anticoagulation therapy, GERD, anemia, arthritis, basal cell carcinoma, scoliosis, squamous cell carcinoma    Clinical Impression  Pt seen for PT evaluation with pt appearing HOH, asking multiple times who ordered PT but pt agreeable to tx. Pt is able to complete bed mobility, transfers, & gait up to 80 ft with RW & supervision. Pt with low BP but not symptomatic & pt able to maintain SpO2 >90% on room air. Will continue to follow pt acutely to focus on high level balance & progress gait.  BP checked in LUE: Supine: 96/65 mmHg (MAP 74) Sitting: 93/74 mmHg (MAP 81) Standing at 0: 107/70 mmHg (MAP 82)  Max HR of 130 bpm after gait    Follow Up Recommendations Home health PT;Supervision for mobility/OOB    Equipment Recommendations  None recommended by PT    Recommendations for Other Services       Precautions / Restrictions Precautions Precautions: Fall Restrictions Weight Bearing Restrictions: No      Mobility  Bed Mobility Overal bed mobility: Needs Assistance Bed Mobility: Supine to Sit     Supine to sit: Supervision;HOB elevated          Transfers Overall transfer level: Needs assistance Equipment used: Rolling walker (2 wheeled) Transfers: Sit to/from Stand Sit to Stand: Supervision            Ambulation/Gait Ambulation/Gait assistance: Supervision Gait Distance (Feet): 80 Feet Assistive device: Rolling walker (2 wheeled) Gait Pattern/deviations: Decreased step length - left;Decreased step length - right;Decreased stride length;Decreased weight shift to left Gait velocity: slightly decreased      Stairs            Wheelchair Mobility     Modified Rankin (Stroke Patients Only)       Balance Overall balance assessment: Needs assistance Sitting-balance support: Feet supported Sitting balance-Leahy Scale: Good     Standing balance support: No upper extremity supported Standing balance-Leahy Scale: Fair Standing balance comment: static standing with supervision without LOB noted                             Pertinent Vitals/Pain Pain Assessment: Faces Faces Pain Scale: Hurts a little bit Pain Location: head Pain Descriptors / Indicators: Aching Pain Intervention(s): Patient requesting pain meds-RN notified    Home Living Family/patient expects to be discharged to:: Assisted living               Home Equipment: Walker - 2 wheels;Cane - single point (rollator)      Prior Function Level of Independence: Independent with assistive device(s)         Comments: Pt reports she uses a rollator inside, cane outside, denies recent falls     Hand Dominance        Extremity/Trunk Assessment   Upper Extremity Assessment Upper Extremity Assessment: Generalized weakness    Lower Extremity Assessment Lower Extremity Assessment: Generalized weakness    Cervical / Trunk Assessment Cervical / Trunk Assessment: Kyphotic (scoliosis)  Communication   Communication: HOH  Cognition Arousal/Alertness: Awake/alert Behavior During Therapy: WFL for tasks assessed/performed Overall Cognitive Status: Within Functional Limits for tasks assessed  General Comments: Pt appears AxO but does also appear HOH, asking who PT is during session after PT introduced herself at beginning of session. Asked twice who ordered PT consult.      General Comments General comments (skin integrity, edema, etc.): Pt noted to be saturated with urine 2/2 purewick malfunction. PT provides total assist for changing gowns. Pt on room air during session, SPO2 >90% (pt endorsed slight  SOB after gait).    Exercises     Assessment/Plan    PT Assessment Patient needs continued PT services  PT Problem List Decreased strength;Decreased mobility;Decreased activity tolerance;Decreased balance       PT Treatment Interventions Therapeutic activities;Gait training;Therapeutic exercise;Patient/family education;Balance training;Functional mobility training    PT Goals (Current goals can be found in the Care Plan section)  Acute Rehab PT Goals Patient Stated Goal: none stated PT Goal Formulation: With patient Time For Goal Achievement: 11/13/20 Potential to Achieve Goals: Good    Frequency Min 2X/week   Barriers to discharge        Co-evaluation               AM-PAC PT "6 Clicks" Mobility  Outcome Measure Help needed turning from your back to your side while in a flat bed without using bedrails?: None Help needed moving from lying on your back to sitting on the side of a flat bed without using bedrails?: A Little Help needed moving to and from a bed to a chair (including a wheelchair)?: A Little Help needed standing up from a chair using your arms (e.g., wheelchair or bedside chair)?: A Little Help needed to walk in hospital room?: A Little Help needed climbing 3-5 steps with a railing? : A Lot 6 Click Score: 18    End of Session Equipment Utilized During Treatment: Gait belt Activity Tolerance: Patient tolerated treatment well Patient left: in chair;with call bell/phone within reach;with nursing/sitter in room Nurse Communication: Mobility status PT Visit Diagnosis: Muscle weakness (generalized) (M62.81)    Time: [id number] PT Time Calculation (min) (ACUTE ONLY): 25 min   Charges:   PT Evaluation $PT Eval Low Complexity: 1 Low PT Treatments $Therapeutic Activity: 8-22 mins        Lavone Nian, PT, DPT 10/30/20, 2:12 PM   Waunita Schooner 10/30/2020, 2:09 PM

## 2020-10-30 NOTE — Consult Note (Signed)
    Heart Failure Nurse Navigator Note  Previously reported ejection fraction 50 to 60%, echocardiogram performed on this admission is currently pending.   She presented with increasing shortness of breath on exertion and PND.  Chest x-ray revealed moderate sized bilateral pleural effusions.  Comorbidities:  Chronic atrial fibrillation Anemia Osteoarthritis  Medications:  Eliquis 2.5 mg 2 times a day Furosemide 40 mg daily Metoprolol succinate 25 mg daily  Labs:  Sodium 142, potassium 3.6, chloride 109, CO2 24, BUN 25, creatinine 0.72, BNP 410 Intake not documented Output 1200 mL   Initial meeting with patient today, she had returned from having thoracentesis performed on her left lung where 500 mL was removed.  At this time she states that her breathing has much improved and she is not currently on any oxygen.  Discussed heart failure, and how to take care of herself once he is discharged.  She does not have a scale, have contacted [facility] to supply her with a scale and discussed the routine of weighing daily and recording and what to report to physician.  She does not use salt at the table and does not eat salty foods.  Discussed following up in the heart failure clinic, she was given information about the clinic along with an appointment date.  She was also giving living with heart failure teaching booklet and the flyer on low sodium.  Pricilla Riffle RN [facility]

## 2020-10-30 NOTE — Plan of Care (Signed)
Pt is AAOx4. Pt had 6 beats of vtach this morning but asymptomatic. On call MD was notified. No new orders just monitor the patient.    Problem: Education: Goal: Knowledge of General Education information will improve Description: Including pain rating scale, medication(s)/side effects and non-pharmacologic comfort measures Outcome: Progressing   Problem: Health Behavior/Discharge Planning: Goal: Ability to manage health-related needs will improve Outcome: Progressing   Problem: Clinical Measurements: Goal: Ability to maintain clinical measurements within normal limits will improve Outcome: Progressing Goal: Will remain free from infection Outcome: Progressing Goal: Diagnostic test results will improve Outcome: Progressing Goal: Respiratory complications will improve Outcome: Progressing Goal: Cardiovascular complication will be avoided Outcome: Progressing   Problem: Activity: Goal: Risk for activity intolerance will decrease Outcome: Progressing   Problem: Nutrition: Goal: Adequate nutrition will be maintained Outcome: Progressing   Problem: Coping: Goal: Level of anxiety will decrease Outcome: Progressing   Problem: Elimination: Goal: Will not experience complications related to bowel motility Outcome: Progressing Goal: Will not experience complications related to urinary retention Outcome: Progressing   Problem: Pain Managment: Goal: General experience of comfort will improve Outcome: Progressing   Problem: Safety: Goal: Ability to remain free from injury will improve Outcome: Progressing   Problem: Skin Integrity: Goal: Risk for impaired skin integrity will decrease Outcome: Progressing

## 2020-10-30 NOTE — Progress Notes (Addendum)
Patient had removed oxygen when I entered the room. oxygen  saturation is normal at 94% on room air.

## 2020-10-30 NOTE — Progress Notes (Signed)
OT Cancellation Note  Patient Details Name: BRITLEE SKOLNIK MRN: [id number] DOB: [birth year]   Cancelled Treatment:    Reason Eval/Treat Not Completed: Patient at procedure or test/ unavailable. OT orders received and chart reviewed. Pt being taken off unit for testing by transport. OT to re-attempt when pt is next available.   Darleen Crocker, [place], OTR/L , CBIS ascom [phone]  10/30/20, 9:55 AM   10/30/2020, 9:54 AM

## 2020-10-30 NOTE — Evaluation (Signed)
Occupational Therapy Evaluation Patient Details Name: Shelly Ponce MRN: [id number] DOB: [birth year] Today's Date: 10/30/2020    History of Present Illness Pt is an 87 y/o F admitted on 10/29/20 with c/c of SOB. Chest x-ray revealed moderate size B pleural effusions with bibasilar atelectasis. PMH: chronic a-fib on anticoagulation therapy, GERD, anemia, arthritis, basal cell carcinoma, scoliosis, squamous cell carcinoma   Clinical Impression   Patient presenting with decreased I in self care, balance, functional mobility/transfer, endurance, and safety awareness.  Patient reports being mod I with use of rollator at ALF PTA. Pt reports being independent with self care tasks at baseline.  Patient currently functioning at close supervision overall with use of RW for safety. HR increases to 130s with functional mobility and toileting tasks. Patient will benefit from acute OT to increase overall independence in the areas of ADLs, functional mobility, and safety awareness in order to safely discharge home.    Follow Up Recommendations  Home health OT;Supervision - Intermittent    Equipment Recommendations  None recommended by OT       Precautions / Restrictions Precautions Precautions: Fall Restrictions Weight Bearing Restrictions: No      Mobility Bed Mobility Overal bed mobility: Needs Assistance Bed Mobility: Supine to Sit;Sit to Supine     Supine to sit: Supervision Sit to supine: Supervision        Transfers Overall transfer level: Needs assistance Equipment used: Rolling walker (2 wheeled) Transfers: Sit to/from [facility] Sit to Stand: Supervision Stand pivot transfers: Supervision            Balance Overall balance assessment: Needs assistance Sitting-balance support: Feet supported Sitting balance-Leahy Scale: Good     Standing balance support: No upper extremity supported Standing balance-Leahy Scale: Fair Standing balance comment: static  standing with supervision without LOB noted                           ADL either performed or assessed with clinical judgement   ADL Overall ADL's : Needs assistance/impaired Eating/Feeding: Independent   Grooming: Wash/dry hands;Wash/dry face;Standing;Supervision/safety               Lower Body Dressing: Supervision/safety;Sit to/from Archivist: Supervision/safety;RW;Regular Museum/gallery exhibitions officer and Hygiene: Supervision/safety;Sit to/from stand       Functional mobility during ADLs: Supervision/safety;Rolling walker General ADL Comments: min cuing for technique for safety     Vision Patient Visual Report: No change from baseline              Pertinent Vitals/Pain Pain Assessment: Faces Faces Pain Scale: No hurt Pain Location: head Pain Descriptors / Indicators: Aching Pain Intervention(s): Patient requesting pain meds-RN notified     Hand Dominance Right   Extremity/Trunk Assessment Upper Extremity Assessment Upper Extremity Assessment: Generalized weakness   Lower Extremity Assessment Lower Extremity Assessment: Generalized weakness   Cervical / Trunk Assessment Cervical / Trunk Assessment: Kyphotic   Communication Communication Communication: HOH   Cognition Arousal/Alertness: Awake/alert Behavior During Therapy: WFL for tasks assessed/performed Overall Cognitive Status: Within Functional Limits for tasks assessed                                 General Comments: Pt is A & O x 4 but very HOH   General Comments  Pt noted to be saturated with urine 2/2 purewick malfunction. PT provides total assist for  changing gowns. Pt on room air during session, SPO2 >90% (pt endorsed slight SOB after gait).            Home Living Family/patient expects to be discharged to:: Assisted living                             Home Equipment: Gilford Rile - 2 wheels;Cane - single point           Prior Functioning/Environment Level of Independence: Independent with assistive device(s)        Comments: Pt reports she uses a rollator inside, cane outside, denies recent falls        OT Problem List: Decreased strength;Impaired balance (sitting and/or standing);Decreased safety awareness;Decreased activity tolerance;Decreased knowledge of use of DME or AE      OT Treatment/Interventions: Self-care/ADL training;Manual therapy;Therapeutic exercise;Patient/family education;Balance training;Energy conservation;Therapeutic activities;Cognitive remediation/compensation;DME and/or AE instruction    OT Goals(Current goals can be found in the care plan section) Acute Rehab OT Goals Patient Stated Goal: to go home OT Goal Formulation: With patient Time For Goal Achievement: 11/13/20 Potential to Achieve Goals: Good ADL Goals Pt Will Perform Grooming: with modified independence;standing Pt Will Perform Lower Body Dressing: with modified independence;sit to/from stand Pt Will Transfer to Toilet: with modified independence;ambulating Pt Will Perform Toileting - Clothing Manipulation and hygiene: with modified independence;sit to/from stand  OT Frequency: Min 2X/week   Barriers to D/C:    none known at this time          AM-PAC OT "6 Clicks" Daily Activity     Outcome Measure Help from another person eating meals?: None Help from another person taking care of personal grooming?: A Little Help from another person toileting, which includes using toliet, bedpan, or urinal?: A Little Help from another person bathing (including washing, rinsing, drying)?: A Little Help from another person to put on and taking off regular upper body clothing?: None Help from another person to put on and taking off regular lower body clothing?: A Little 6 Click Score: 20   End of Session Equipment Utilized During Treatment: Rolling walker Nurse Communication: Mobility status  Activity Tolerance: Patient  tolerated treatment well Patient left: in bed;with call bell/phone within reach;with bed alarm set  OT Visit Diagnosis: Unsteadiness on feet (R26.81);Muscle weakness (generalized) (M62.81)                Time: 3295-1884 OT Time Calculation (min): 21 min Charges:  OT General Charges $OT Visit: 1 Visit OT Treatments $Self Care/Home Management : 8-22 mins  Darleen Crocker, MS, OTR/L , CBIS ascom [phone]  10/30/20, 4:06 PM

## 2020-10-30 NOTE — Progress Notes (Signed)
Patient ID: Shelly Ponce, female   DOB: 1933/01/22, 87 y.o.   MRN: [id number] [facility] PROGRESS NOTE  Shelly Ponce YWV:[id number] DOB: 1932/11/29 DOA: 10/29/2020 PCP: Adin Hector, MD  HPI/Subjective: Patient was on oxygen this morning.  She states that she is breathing better today than yesterday.  Came in with shortness of breath.  Found to have bilateral pleural effusions.  Objective: Vitals:   10/30/20 1158 10/30/20 1233  BP: (!) 89/62   Pulse: 82   Resp: 16   Temp: 98.5 F (36.9 C)   SpO2: 95% 94%    Intake/Output Summary (Last 24 hours) at 10/30/2020 1330 Last data filed at 10/30/2020 0950 Gross per 24 hour  Intake 120 ml  Output 1300 ml  Net -1180 ml   Filed Weights   10/29/20 1326 10/30/20 0500  Weight: 45 kg 45.9 kg    ROS: Review of Systems  Respiratory: Positive for cough and shortness of breath.   Cardiovascular: Negative for chest pain.  Gastrointestinal: Negative for abdominal pain, nausea and vomiting.   Exam: Physical Exam HENT:     Head: Normocephalic.     Mouth/Throat:     Pharynx: No oropharyngeal exudate.  Eyes:     General: Lids are normal.     Conjunctiva/sclera: Conjunctivae normal.     Pupils: Pupils are equal, round, and reactive to light.  Cardiovascular:     Rate and Rhythm: Tachycardia present. Rhythm regularly irregular.     Heart sounds: Normal heart sounds, S1 normal and S2 normal.  Pulmonary:     Breath sounds: Examination of the right-lower field reveals decreased breath sounds and rhonchi. Examination of the left-lower field reveals decreased breath sounds and rhonchi. Decreased breath sounds and rhonchi present. No wheezing or rales.  Abdominal:     Palpations: Abdomen is soft.     Tenderness: There is no abdominal tenderness.  Musculoskeletal:     Right lower leg: No swelling.     Left lower leg: No swelling.  Skin:    General: Skin is warm.     Findings: No rash.  Neurological:     Mental Status: She is  alert and oriented to person, place, and time.       Data Reviewed: Basic Metabolic Panel: Recent Labs  Lab 10/29/20 1331 10/30/20 0440  NA 139 142  K 4.9 3.6  CL 109 109  CO2 19* 24  GLUCOSE 135* 94  BUN 23 25*  CREATININE 0.90 0.92  CALCIUM 9.0 8.3*   Liver Function Tests: Recent Labs  Lab 10/29/20 1331  AST 47*  ALT 18  ALKPHOS 52  BILITOT 1.2  PROT 6.3*  ALBUMIN 3.8   CBC: Recent Labs  Lab 10/29/20 1331  WBC 7.4  HGB 12.9  HCT 40.1  MCV 99.5  PLT 222   BNP (last 3 results) Recent Labs    10/29/20 1331  BNP 410.0*    ProBNP (last 3 results) No results for input(s): PROBNP in the last 8760 hours.    Recent Results (from the past 240 hour(s))  SARS CORONAVIRUS 2 (TAT 6-24 HRS) Nasopharyngeal Nasopharyngeal Swab     Status: None   Collection Time: 10/29/20  3:42 PM   Specimen: Nasopharyngeal Swab  Result Value Ref Range Status   SARS Coronavirus 2 NEGATIVE NEGATIVE Final    Comment: (NOTE) SARS-CoV-2 target nucleic acids are NOT DETECTED.  The SARS-CoV-2 RNA is generally detectable in upper and lower respiratory specimens during the acute phase of  infection. Negative results do not preclude SARS-CoV-2 infection, do not rule out co-infections with other pathogens, and should not be used as the sole basis for treatment or other patient management decisions. Negative results must be combined with clinical observations, patient history, and epidemiological information. The expected result is Negative.  Fact Sheet for Patients: SugarRoll.be  Fact Sheet for Healthcare Providers: https://www.woods-mathews.com/  This test is not yet approved or cleared by the [place] FDA and  has been authorized for detection and/or diagnosis of SARS-CoV-2 by FDA under an Emergency Use Authorization (EUA). This EUA will remain  in effect (meaning this test can be used) for the duration of the COVID-19 declaration  under Se ction 564(b)(1) of the Act, 21 U.S.C. section 360bbb-3(b)(1), unless the authorization is terminated or revoked sooner.  Performed at [facility], [place] [address] [place], [place] [address]      Studies: DG Chest 1 View  Result Date: 10/30/2020 CLINICAL DATA:  Left thoracentesis EXAM: CHEST  1 VIEW COMPARISON:  Yesterday FINDINGS: Essentially resolved left pleural effusion after thoracentesis. No pneumothorax or re-expansion edema. When allowing for layering, presumably similar right pleural effusion. Cardiomegaly. IMPRESSION: Left thoracentesis without complicating feature. Electronically Signed   By: Monte Fantasia M.D.   On: 10/30/2020 11:04   DG Chest 2 View  Result Date: 10/29/2020 CLINICAL DATA:  Shortness of breath. EXAM: CHEST - 2 VIEW COMPARISON:  06/26/2017 FINDINGS: Normal sized heart. Interval moderate-sized bilateral pleural effusions and bibasilar atelectasis. Thoracolumbar spine degenerative changes and scoliosis. IMPRESSION: Interval moderate-sized bilateral pleural effusions with bibasilar atelectasis. Electronically Signed   By: Claudie Revering M.D.   On: 10/29/2020 14:31   US THORACENTESIS ASP PLEURAL SPACE W/IMG GUIDE  Result Date: 10/30/2020 INDICATION: Patient admitted for shortness of breath and atrial fibrillation with rapid ventricular rate. Patient found to have bilateral pleural effusions and Interventional Radiology has been asked to perform a therapeutic and diagnostic thoracentesis. EXAM: ULTRASOUND GUIDED THORACENTESIS MEDICATIONS: 1% lidocaine 10 mL COMPLICATIONS: None immediate. PROCEDURE: An ultrasound guided thoracentesis was thoroughly discussed with the patient and questions answered. The benefits, risks, alternatives and complications were also discussed. The patient understands and wishes to proceed with the procedure. Written consent was obtained. Ultrasound was performed to localize and mark an adequate pocket of fluid in the left chest. The  area was then prepped and draped in the normal sterile fashion. 1% Lidocaine was used for local anesthesia. Under ultrasound guidance a 6 Fr Safe-T-Centesis catheter was introduced. Thoracentesis was performed. The catheter was removed and a dressing applied. FINDINGS: A total of approximately 550 mL of clear yellow fluid was removed. Samples were sent to the laboratory as requested by the clinical team. IMPRESSION: Successful ultrasound guided left thoracentesis yielding 550 mL of pleural fluid. Read by: Soyla Dryer, NP Electronically Signed   By: Ruthann Cancer MD   On: 10/30/2020 12:03    Scheduled Meds: . apixaban  2.5 mg Oral BID  . cholecalciferol  1,000 Units Oral Daily  . diltiazem  240 mg Oral Daily  . docusate sodium  100 mg Oral BID  . estradiol  0.5 mg Oral Daily  . furosemide  40 mg Intravenous Daily  . HYDROcodone-acetaminophen  1 tablet Oral TID  . metoprolol succinate  25 mg Oral Daily  . multivitamin-lutein   Oral BID  . pantoprazole  40 mg Oral Daily  . progesterone  100 mg Oral Daily  . senna-docusate  1 tablet Oral Daily  . sodium chloride flush  3 mL Intravenous Q12H  . traZODone  50 mg Oral QHS   Continuous Infusions: . sodium chloride      Assessment/Plan:  1. Acute hypoxic respiratory failure.  Pulse ox 87% on presentation.  This morning on 3 L of oxygen.  Taper oxygen as able to do so. 2. Bilateral pleural effusions.  Could be secondary to CHF for rheumatoid arthritis.  Patient had 550 mL drawn off from underneath the left lung this morning by interventional radiology.  Depending on progress may consider doing right thoracentesis on Monday. 3. Acute diastolic congestive heart failure.  Change Cardizem CD over to Toprol XL.  Continue IV Lasix.  Blood pressure on the lower side.  Echocardiogram done but not read yet. 4. Chronic atrial fibrillation.  On Eliquis for anticoagulation.  We will switch Cardizem over to Toprol. 5. GERD on Protonix 6. Weakness.  PT  eval.     Code Status:     Code Status Orders  (From admission, onward)         Start     Ordered   10/29/20 1636  Do not attempt resuscitation (DNR)  Continuous       Question Answer Comment  In the event of cardiac or respiratory ARREST Do not call a "code blue"   In the event of cardiac or respiratory ARREST Do not perform Intubation, CPR, defibrillation or ACLS   In the event of cardiac or respiratory ARREST Use medication by any route, position, wound care, and other measures to relive pain and suffering. May use oxygen, suction and manual treatment of airway obstruction as needed for comfort.   Comments CODE STATUS was discussed with patient and she wants to be a DNR      10/29/20 1638        Code Status History    Date Active Date Inactive Code Status Order ID Comments User Context   06/26/2017 1607 06/27/2017 1925 DNR [id number]  Fritzi Mandes, MD Inpatient   05/15/2015 1940 05/23/2015 1836 DNR [id number]  [name], MD Inpatient   Advance Care Planning Activity    Advance Directive Documentation   Flowsheet Row Most Recent Value  Type of Advance Directive Living will  Pre-existing out of facility DNR order (yellow form or pink MOST form) --  "MOST" Form in Place? --     Family Communication: Spoke with patient's friend Humberto Leep on the phone Disposition Plan: Status is: Inpatient  Dispo: The patient is from: Assisted living              Anticipated d/c is to: Assisted living              Patient currently had left thoracentesis today.  On IV diuresis.   Difficult to place patient.  No.  Consultants:  Cardiology  Procedures:  Left thoracentesis  Time spent: 28 minutes  [place]

## 2020-10-31 DIAGNOSIS — I5043 Acute on chronic combined systolic (congestive) and diastolic (congestive) heart failure: Secondary | ICD-10-CM

## 2020-10-31 DIAGNOSIS — I4891 Unspecified atrial fibrillation: Secondary | ICD-10-CM

## 2020-10-31 LAB — BASIC METABOLIC PANEL
Anion gap: 7 (ref 5–15)
BUN: 24 mg/dL — ABNORMAL HIGH (ref 8–23)
CO2: 26 mmol/L (ref 22–32)
Calcium: 8 mg/dL — ABNORMAL LOW (ref 8.9–10.3)
Chloride: 106 mmol/L (ref 98–111)
Creatinine, Ser: 0.81 mg/dL (ref 0.44–1.00)
GFR, Estimated: >60 mL/min (ref >60)
Glucose, Bld: 86 mg/dL (ref 70–99)
Potassium: 3.4 mmol/L — ABNORMAL LOW (ref 3.5–5.1)
Sodium: 139 mmol/L (ref 135–145)

## 2020-10-31 LAB — CBC
HCT: 34.9 % — ABNORMAL LOW (ref 36.0–46.0)
Hemoglobin: 11.5 g/dL — ABNORMAL LOW (ref 12.0–15.0)
MCH: 32.8 pg (ref 26.0–34.0)
MCHC: 33 g/dL (ref 30.0–36.0)
MCV: 99.4 fL (ref 80.0–100.0)
Platelets: 169 10*3/uL (ref 150–400)
RBC: 3.51 MIL/uL — ABNORMAL LOW (ref 3.87–5.11)
RDW: 16 % — ABNORMAL HIGH (ref 11.5–15.5)
WBC: 5.8 10*3/uL (ref 4.0–10.5)
nRBC: 0 % (ref 0.0–0.2)

## 2020-10-31 MED ORDER — POTASSIUM CHLORIDE CRYS ER 20 MEQ PO TBCR
20.0000 meq | EXTENDED_RELEASE_TABLET | Freq: Every day | ORAL | Status: DC
  Administered 2020-10-31 – 2020-11-02 (×3): 20 meq via ORAL
  Filled 2020-10-31 (×3): qty 1

## 2020-10-31 MED ORDER — FUROSEMIDE 20 MG PO TABS
20.0000 mg | ORAL_TABLET | Freq: Two times a day (BID) | ORAL | Status: DC
  Administered 2020-11-01 (×2): 20 mg via ORAL
  Filled 2020-10-31 (×2): qty 1

## 2020-10-31 MED ORDER — METOPROLOL TARTRATE 5 MG/5ML IV SOLN
5.0000 mg | INTRAVENOUS | Status: DC | PRN
  Administered 2020-10-31: 5 mg via INTRAVENOUS

## 2020-10-31 MED ORDER — METOPROLOL SUCCINATE ER 50 MG PO TB24
50.0000 mg | ORAL_TABLET | Freq: Two times a day (BID) | ORAL | Status: DC
  Administered 2020-10-31 – 2020-11-02 (×5): 50 mg via ORAL
  Filled 2020-10-31 (×5): qty 1

## 2020-10-31 MED ORDER — FLUTICASONE PROPIONATE 50 MCG/ACT NA SUSP
2.0000 | Freq: Every day | NASAL | Status: DC
  Administered 2020-10-31 – 2020-11-02 (×3): 2 via NASAL
  Filled 2020-10-31: qty 16

## 2020-10-31 MED ORDER — AMIODARONE HCL 200 MG PO TABS
200.0000 mg | ORAL_TABLET | Freq: Two times a day (BID) | ORAL | Status: DC
  Administered 2020-10-31 – 2020-11-02 (×5): 200 mg via ORAL
  Filled 2020-10-31 (×5): qty 1

## 2020-10-31 NOTE — Progress Notes (Signed)
Patient ID: Shelly Ponce, female   DOB: [birth year], 87 y.o.   MRN: [id number] [facility] PROGRESS NOTE  MARSENA TAFF KKX:[id number] DOB: 1932-09-08 DOA: 10/29/2020 PCP: Adin Hector, MD  HPI/Subjective: Patient feels well.  She is off oxygen.  Offers no complaints.  Breathing better than when she came in.  Had 550 mL pulled off underneath her left lung.  Called numerous times with her heart rate going up in the 140s and above.  Objective: Vitals:   10/31/20 1147 10/31/20 1255  BP: 95/68 92/67  Pulse: (!) 105 62  Resp: 18 18  Temp: 98 F (36.7 C) 98.1 F (36.7 C)  SpO2: 93% 97%    Intake/Output Summary (Last 24 hours) at 10/31/2020 1512 Last data filed at 10/31/2020 0952 Gross per 24 hour  Intake --  Output 250 ml  Net -250 ml   Filed Weights   10/30/20 0500 10/31/20 0500 10/31/20 0826  Weight: 45.9 kg 44.6 kg 42.7 kg    ROS: Review of Systems  Respiratory: Negative for cough and shortness of breath.   Cardiovascular: Negative for chest pain.  Gastrointestinal: Negative for abdominal pain, nausea and vomiting.   Exam: Physical Exam HENT:     Head: Normocephalic.     Mouth/Throat:     Pharynx: No oropharyngeal exudate.  Eyes:     General: Lids are normal.     Conjunctiva/sclera: Conjunctivae normal.     Pupils: Pupils are equal, round, and reactive to light.  Cardiovascular:     Rate and Rhythm: Tachycardia present. Rhythm irregularly irregular.     Heart sounds: Normal heart sounds, S1 normal and S2 normal.  Pulmonary:     Breath sounds: Examination of the right-middle field reveals decreased breath sounds. Examination of the right-lower field reveals decreased breath sounds and rhonchi. Examination of the left-lower field reveals decreased breath sounds. Decreased breath sounds and rhonchi present. No wheezing or rales.  Abdominal:     Palpations: Abdomen is soft.     Tenderness: There is no abdominal tenderness.  Musculoskeletal:     Right ankle:  No swelling.     Left ankle: No swelling.  Skin:    General: Skin is warm.     Findings: No rash.  Neurological:     Mental Status: She is alert and oriented to person, place, and time.       Data Reviewed: Basic Metabolic Panel: Recent Labs  Lab 10/29/20 1331 10/30/20 0440 10/31/20 0512  NA 139 142 139  K 4.9 3.6 3.4*  CL 109 109 106  CO2 19* 24 26  GLUCOSE 135* 94 86  BUN 23 25* 24*  CREATININE 0.90 0.92 0.81  CALCIUM 9.0 8.3* 8.0*   Liver Function Tests: Recent Labs  Lab 10/29/20 1331  AST 47*  ALT 18  ALKPHOS 52  BILITOT 1.2  PROT 6.3*  ALBUMIN 3.8   CBC: Recent Labs  Lab 10/29/20 1331 10/31/20 0512  WBC 7.4 5.8  HGB 12.9 11.5*  HCT 40.1 34.9*  MCV 99.5 99.4  PLT 222 169   BNP (last 3 results) Recent Labs    10/29/20 1331  BNP 410.0*     Recent Results (from the past 240 hour(s))  SARS CORONAVIRUS 2 (TAT 6-24 HRS) Nasopharyngeal Nasopharyngeal Swab     Status: None   Collection Time: 10/29/20  3:42 PM   Specimen: Nasopharyngeal Swab  Result Value Ref Range Status   SARS Coronavirus 2 NEGATIVE NEGATIVE Final    Comment: (  NOTE) SARS-CoV-2 target nucleic acids are NOT DETECTED.  The SARS-CoV-2 RNA is generally detectable in upper and lower respiratory specimens during the acute phase of infection. Negative results do not preclude SARS-CoV-2 infection, do not rule out co-infections with other pathogens, and should not be used as the sole basis for treatment or other patient management decisions. Negative results must be combined with clinical observations, patient history, and epidemiological information. The expected result is Negative.  Fact Sheet for Patients: SugarRoll.be  Fact Sheet for Healthcare Providers: https://www.woods-mathews.com/  This test is not yet approved or cleared by the [place] FDA and  has been authorized for detection and/or diagnosis of SARS-CoV-2 by FDA under an  Emergency Use Authorization (EUA). This EUA will remain  in effect (meaning this test can be used) for the duration of the COVID-19 declaration under Se ction 564(b)(1) of the Act, 21 U.S.C. section 360bbb-3(b)(1), unless the authorization is terminated or revoked sooner.  Performed at [facility], [place] [address] [place], [place] [address]   Body fluid culture w Gram Stain     Status: None (Preliminary result)   Collection Time: 10/30/20 10:42 AM   Specimen: PATH Cytology Pleural fluid  Result Value Ref Range Status   Specimen Description   Final    PLEURAL Performed at [facility], [address] [place], [place] [address]    Special Requests   Final    LEFT PLEURAL FOOT Performed at [facility], [address] [place], [place] [address]    Gram Stain PENDING  Incomplete   Culture   Final    NO GROWTH < 24 HOURS Performed at [facility], [place] [address] [place], [place] [address]    Report Status PENDING  Incomplete     Studies: DG Chest 1 View  Result Date: 10/30/2020 CLINICAL DATA:  Left thoracentesis EXAM: CHEST  1 VIEW COMPARISON:  Yesterday FINDINGS: Essentially resolved left pleural effusion after thoracentesis. No pneumothorax or re-expansion edema. When allowing for layering, presumably similar right pleural effusion. Cardiomegaly. IMPRESSION: Left thoracentesis without complicating feature. Electronically Signed   By: Monte Fantasia M.D.   On: 10/30/2020 11:04   ECHOCARDIOGRAM COMPLETE  Result Date: 10/30/2020    ECHOCARDIOGRAM REPORT   Patient Name:   Shelly Ponce Date of Exam: 10/30/2020 Medical Rec #:  [id number]      Height:       61.0 in Accession #:    [id number]     Weight:       101.2 lb Date of Birth:  01-Jan-1933      BSA:          1.414 m Patient Age:    68 years       BP:           102/74 mmHg Patient Gender: F              HR:           75 bpm. Exam Location:  [facility] Procedure: 2D Echo, Color Doppler and Cardiac Doppler  Indications:     I50.31 CHF-Acute Diastolic  History:         Patient has prior history of Echocardiogram examinations.                  Arrythmias:Atrial Fibrillation.  Sonographer:     Charmayne Sheer RDCS (AE) Referring Phys:  [id number] Collier Bullock Diagnosing Phys: Serafina Royals MD  Sonographer Comments: Suboptimal subcostal window. IMPRESSIONS  1. Left ventricular ejection fraction, by estimation, is 40 to 45%. The left ventricle has mildly decreased function. The left ventricle demonstrates global hypokinesis. The left ventricular internal cavity size was mildly dilated. Left ventricular diastolic parameters were normal.  2. Right ventricular systolic function is normal. The right ventricular size is normal.  3. Left atrial size was moderately dilated.  4. Right atrial size was moderately dilated.  5. The mitral valve is normal in structure. Mild mitral valve regurgitation.  6. The aortic valve is normal in structure. Aortic valve regurgitation is trivial. FINDINGS  Left Ventricle: Left ventricular ejection fraction, by estimation, is 40 to 45%. The left ventricle has mildly decreased function. The left ventricle demonstrates global hypokinesis. The left ventricular internal cavity size was mildly dilated. There is  no left ventricular hypertrophy. Left ventricular diastolic parameters were normal. Right Ventricle: The right ventricular size is normal. No increase in right ventricular wall thickness. Right ventricular systolic function is normal. Left Atrium: Left atrial size was moderately dilated. Right Atrium: Right atrial size was moderately dilated. Pericardium: There is no evidence of pericardial effusion. Mitral Valve: The mitral valve is normal in structure. Mild mitral valve regurgitation. MV peak gradient, 4.0 mmHg. The mean mitral valve gradient is 1.0 mmHg. Tricuspid Valve: The tricuspid valve is normal in structure. Tricuspid valve regurgitation is mild. Aortic Valve: The aortic valve is normal in  structure. Aortic valve regurgitation is trivial. Aortic valve mean gradient measures 1.0 mmHg. Aortic valve peak gradient measures 2.4 mmHg. Aortic valve area, by VTI measures 1.85 cm. Pulmonic Valve: The pulmonic valve was not assessed. Pulmonic valve regurgitation is not visualized. Aorta: The aortic root and ascending aorta are structurally normal, with no evidence of dilitation. IAS/Shunts: No atrial level shunt detected by color flow Doppler.  LEFT VENTRICLE PLAX 2D LVIDd:         3.70 cm  Diastology LVIDs:         3.30 cm  LV e' medial:    6.20 cm/s LV PW:         1.00 cm  LV E/e' medial:  14.4 LV IVS:        0.70 cm  LV e' lateral:   9.79 cm/s LVOT diam:     1.70 cm  LV E/e' lateral: 9.1 LV SV:         25 LV SV Index:   18 LVOT Area:     2.27 cm  RIGHT VENTRICLE RV Basal diam:  3.00 cm LEFT ATRIUM             Index       RIGHT ATRIUM           Index LA diam:        3.50 cm 2.48 cm/m  RA Area:     24.00 cm LA Vol (A2C):   52.6 ml 37.20 ml/m RA Volume:   77.80 ml  55.02 ml/m LA Vol (A4C):   63.7 ml 45.05 ml/m LA Biplane Vol: 58.1 ml 41.09 ml/m  AORTIC VALVE                   PULMONIC VALVE AV Area (Vmax):    1.74 cm    PV Vmax:       0.54 m/s AV Area (Vmean):   1.70 cm    PV Vmean:      37.400 cm/s AV Area (VTI):     1.85 cm    PV VTI:  0.092 m AV Vmax:           77.60 cm/s  PV Peak grad:  1.2 mmHg AV Vmean:          57.500 cm/s PV Mean grad:  1.0 mmHg AV VTI:            0.135 m AV Peak Grad:      2.4 mmHg AV Mean Grad:      1.0 mmHg LVOT Vmax:         59.40 cm/s LVOT Vmean:        43.100 cm/s LVOT VTI:          0.110 m LVOT/AV VTI ratio: 0.81  AORTA Ao Root diam: 3.30 cm MITRAL VALVE               TRICUSPID VALVE MV Area (PHT): 5.16 cm    TR Peak grad:   23.2 mmHg MV Area VTI:   1.16 cm    TR Vmax:        241.00 cm/s MV Peak grad:  4.0 mmHg MV Mean grad:  1.0 mmHg    SHUNTS MV Vmax:       1.00 m/s    Systemic VTI:  0.11 m MV Vmean:      50.2 cm/s   Systemic Diam: 1.70 cm MV Decel Time: 147  msec MV E velocity: 89.53 cm/s Serafina Royals MD Electronically signed by Serafina Royals MD Signature Date/Time: 10/30/2020/3:08:54 PM    Final    US THORACENTESIS ASP PLEURAL SPACE W/IMG GUIDE  Result Date: 10/30/2020 INDICATION: Patient admitted for shortness of breath and atrial fibrillation with rapid ventricular rate. Patient found to have bilateral pleural effusions and Interventional Radiology has been asked to perform a therapeutic and diagnostic thoracentesis. EXAM: ULTRASOUND GUIDED THORACENTESIS MEDICATIONS: 1% lidocaine 10 mL COMPLICATIONS: None immediate. PROCEDURE: An ultrasound guided thoracentesis was thoroughly discussed with the patient and questions answered. The benefits, risks, alternatives and complications were also discussed. The patient understands and wishes to proceed with the procedure. Written consent was obtained. Ultrasound was performed to localize and mark an adequate pocket of fluid in the left chest. The area was then prepped and draped in the normal sterile fashion. 1% Lidocaine was used for local anesthesia. Under ultrasound guidance a 6 Fr Safe-T-Centesis catheter was introduced. Thoracentesis was performed. The catheter was removed and a dressing applied. FINDINGS: A total of approximately 550 mL of clear yellow fluid was removed. Samples were sent to the laboratory as requested by the clinical team. IMPRESSION: Successful ultrasound guided left thoracentesis yielding 550 mL of pleural fluid. Read by: Soyla Dryer, NP Electronically Signed   By: Ruthann Cancer MD   On: 10/30/2020 12:03    Scheduled Meds: . amiodarone  200 mg Oral BID  . apixaban  2.5 mg Oral BID  . cholecalciferol  1,000 Units Oral Daily  . docusate sodium  100 mg Oral BID  . estradiol  0.5 mg Oral Daily  . fluticasone  2 spray Each Nare Daily  . [START ON 11/01/2020] furosemide  20 mg Oral BID  . HYDROcodone-acetaminophen  1 tablet Oral TID  . metoprolol succinate  50 mg Oral BID  .  multivitamin-lutein   Oral BID  . pantoprazole  40 mg Oral Daily  . potassium chloride  20 mEq Oral Daily  . progesterone  100 mg Oral Daily  . senna-docusate  1 tablet Oral Daily  . sodium chloride flush  3 mL Intravenous Q12H  . traZODone  50 mg Oral QHS   Continuous Infusions: . sodium chloride      Assessment/Plan:  1. Acute hypoxic respiratory failure.  The patient's pulse ox was 87% on presentation.  This morning she was off oxygen. 2. Rapid atrial fibrillation, chronic in nature.  Heart rates going above 140 and sustained.  On Eliquis for anticoagulation.  Increase Toprol-XL to 50 mg twice daily.  Had to add amiodarone this afternoon. 3. Bilateral pleural effusions.  Likely secondary to CHF.  Patient had 550 mL drawn off from underneath the left lung yesterday.  Will consider right thoracentesis on Monday. 4. 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  Blood pressure too low for numerous medications.  Need heart rate control with Toprol-XL and will also give low-dose oral Lasix starting tomorrow.  Add IV Lasix today. 5. GERD on Protonix 6. Weakness.  PT evaluation.        Code Status:     Code Status Orders  (From admission, onward)         Start     Ordered   10/31/20 1028  Full code  Continuous        10/31/20 1028        Code Status History    Date Active Date Inactive Code Status Order ID Comments User Context   10/29/2020 1638 10/31/2020 1028 DNR [id number]  Collier Bullock, MD ED   06/26/2017 1607 06/27/2017 1925 DNR [id number]  Fritzi Mandes, MD Inpatient   05/15/2015 1940 05/23/2015 1836 DNR [id number]  [name], MD Inpatient   Advance Care Planning Activity    Advance Directive Documentation   Flowsheet Row Most Recent Value  Type of Advance Directive Living will  Pre-existing out of facility DNR order (yellow form or pink MOST form) --  "MOST" Form in Place? --     Family Communication: Patient's friend on the  phone Disposition Plan: Status is: Inpatient  Dispo: The patient is from: Home              Anticipated d/c is to: Home              Patient currently treating rapid atrial fibrillation today, likely right thoracentesis on Monday   Difficult to place patient.  No.  Consultants:  Cardiology  Time spent: 28 minutes  [place]

## 2020-10-31 NOTE — Progress Notes (Signed)
   10/31/20 1147  Assess: MEWS Score  Temp 98 F (36.7 C)  BP 95/68  Pulse Rate (!) 105  Resp 18  Level of Consciousness Alert  SpO2 93 %  O2 Device Room Air  Assess: MEWS Score  MEWS Temp 0  MEWS Systolic 1  MEWS Pulse 1  MEWS RR 0  MEWS LOC 0  MEWS Score 2  MEWS Score Color Yellow  Assess: if the MEWS score is Yellow or Red  Were vital signs taken at a resting state? Yes  Focused Assessment Change from prior assessment (see assessment flowsheet)  Early Detection of Sepsis Score *See Row Information* Low  MEWS guidelines implemented *See Row Information* Yes  Treat  Pain Scale 0-10  Pain Score 0  Take Vital Signs  Increase Vital Sign Frequency  Yellow: Q 2hr X 2 then Q 4hr X 2, if remains yellow, continue Q 4hrs  Escalate  MEWS: Escalate Yellow: discuss with charge nurse/RN and consider discussing with provider and RRT  Notify: Charge Nurse/RN  Name of Charge Nurse/RN Notified Linard Millers RN  Date Charge Nurse/RN Notified 10/31/20  Time Charge Nurse/RN Notified 57  Document  Patient Outcome Other (Comment) (PRN medications given, MD aware)  Progress note created (see row info) Yes

## 2020-10-31 NOTE — Progress Notes (Signed)
Rechecked vitals and MEWS turned Green. New Note Filed.   10/31/20 1147  Assess: MEWS Score  Temp 98 F (36.7 C)  BP 95/68  Pulse Rate (!) 105  Resp 18  Level of Consciousness Alert  SpO2 93 %  O2 Device Room Air  Assess: MEWS Score  MEWS Temp 0  MEWS Systolic 1  MEWS Pulse 1  MEWS RR 0  MEWS LOC 0  MEWS Score 2  MEWS Score Color Yellow  Assess: if the MEWS score is Yellow or Red  Were vital signs taken at a resting state? Yes  Focused Assessment Change from prior assessment (see assessment flowsheet)  Early Detection of Sepsis Score *See Row Information* Low  MEWS guidelines implemented *See Row Information* No, vital signs rechecked  Treat  Pain Scale 0-10  Pain Score 0  Escalate  MEWS: Escalate Yellow: discuss with charge nurse/RN and consider discussing with provider and RRT  Notify: Charge Nurse/RN  Name of Charge Nurse/RN Notified Linard Millers RN  Date Charge Nurse/RN Notified 10/31/20  Time Charge Nurse/RN Notified 53  Document  Patient Outcome Other (Comment) (PRN medications given, MD aware)  Progress note created (see row info) Yes

## 2020-10-31 NOTE — Progress Notes (Signed)
HR non sustaining 150-160. Dropping only to 130s. MD [place] notified via secure chat. PRN dose Metoprolol given at this time. Patient asymptomatic.

## 2020-10-31 NOTE — TOC Initial Note (Signed)
Transition of Care [facility]) - Initial/Assessment Note    Patient Details  Name: Shelly Ponce MRN: [id number] Date of Birth: 09-22-1932  Transition of Care [facility]) CM/SW Contact:    Shelbie Hutching, RN Phone Number: 10/31/2020, 3:29 PM  Clinical Narrative:                 Patient admitted to the hospital with acute diastolic CHF.  RNCM met with patient at the bedside.  Patient reports that she is feeling better and wants to get home.  Patient is from [facility] assisted living and has been there for 3-4 years now.  Patient walks with a walker.  She is current with PCP [name].  Patient has no family but her emergency contact and HCPOA is her friend Shelly Ponce.  Patient reports that [name] will pick her up at discharge.  Patient has had therapy at [facility] in the past.  PT is recommending home health at discharge.      Expected Discharge Plan: Assisted Living Barriers to Discharge: Continued Medical Work up   Patient Goals and CMS Choice Patient states their goals for this hospitalization and ongoing recovery are:: Patient wants to return home and pick up where she left off- "start life the way I left it"      Expected Discharge Plan and Services Expected Discharge Plan: Assisted Living   Discharge Planning Services: CM Consult   Living arrangements for the past 2 months: Assisted Living Facility                 DME Arranged: N/A DME Agency: NA       HH Arranged: NA          Prior Living Arrangements/Services Living arrangements for the past 2 months: [place] Lives with:: Facility Resident Patient language and need for interpreter reviewed:: Yes Do you feel safe going back to the place where you live?: Yes      Need for Family Participation in Patient Care: Yes (Comment) Care giver support system in place?: Yes (comment) (HCPOA) Current home services: DME (walker) Criminal Activity/Legal Involvement Pertinent to Current Situation/Hospitalization: No  - Comment as needed  Activities of Daily Living Home Assistive Devices/Equipment: Cane (specify quad or straight) (straigth) ADL Screening (condition at time of admission) Patient's cognitive ability adequate to safely complete daily activities?: Yes Is the patient deaf or have difficulty hearing?: Yes Does the patient have difficulty seeing, even when wearing glasses/contacts?: No Does the patient have difficulty concentrating, remembering, or making decisions?: No Patient able to express need for assistance with ADLs?: Yes Does the patient have difficulty dressing or bathing?: No Independently performs ADLs?: No Communication: Independent Dressing (OT): Needs assistance Is this a change from baseline?: Pre-admission baseline Grooming: Independent Feeding: Independent Bathing: Needs assistance Is this a change from baseline?: Pre-admission baseline Toileting: Needs assistance Is this a change from baseline?: Pre-admission baseline In/Out Bed: Independent Walks in Home: Independent Does the patient have difficulty walking or climbing stairs?: No Weakness of Legs: Both Weakness of Arms/Hands: Both  Permission Sought/Granted Permission sought to share information with : Case Manager,Family Supports,Other (comment) Permission granted to share information with : Yes, Verbal Permission Granted  Share Information with NAME: Shelly Ponce HCPOA  Permission granted to share info w AGENCY: [place] granted to share info w Relationship: friend HCPOA     Emotional Assessment Appearance:: Appears stated age Attitude/Demeanor/Rapport: Engaged Affect (typically observed): Accepting Orientation: : Oriented to Self,Oriented to Place,Oriented  to  Time,Oriented to Situation Alcohol / Substance Use: Not Applicable Psych Involvement: No (comment)  Admission diagnosis:  Hypoxia [R09.02] Atrial fibrillation with RVR (HCC) [P49.82] Acute diastolic CHF (congestive heart failure)  (HCC) [I50.31] Acute congestive heart failure, unspecified heart failure type [facility]) [I50.9] Patient Active Problem List   Diagnosis Date Noted  . Atrial fibrillation with RVR ([place])   . Acute on chronic combined systolic and diastolic CHF (congestive heart failure) ([place])   . Bilateral pleural effusion   . Weakness   . Acute diastolic CHF (congestive heart failure) ([place]) 10/29/2020  . Acute respiratory failure ([place])   . Protein-calorie malnutrition, severe 06/27/2017  . Atrial fibrillation, chronic ([place]) 06/26/2017  . Absolute anemia 05/26/2015  . Chronic constipation 05/26/2015  . Acid reflux 05/26/2015  . BP (high blood pressure) 05/26/2015  . Edema leg 05/26/2015  . OP (osteoporosis) 05/26/2015  . Dupuytren's contracture of foot 05/26/2015  . CA of skin 05/26/2015  . Abdominal pain   . Sepsis ([place]) 05/15/2015  . UTI (lower urinary tract infection) 05/15/2015  . Bursitis, trochanteric 01/22/2015  . Degeneration of intervertebral disc of lumbar region 01/23/2014  . Neuritis or radiculitis due to rupture of lumbar intervertebral disc 01/22/2014  . Cyst and pseudocyst of pancreas 06/02/2013   PCP:  Adin Hector, MD Pharmacy:   [place], [place] [address] Phone: [phone] Fax: [phone]     Social Determinants of Health (SDOH) Interventions    Readmission Risk Interventions No flowsheet data found.

## 2020-10-31 NOTE — Progress Notes (Signed)
Updated Vitals when MEWS turned Green  10/31/20 1255  Assess: MEWS Score  Temp 98.1 F (36.7 C)  BP 92/67  Pulse Rate 62  Resp 18  SpO2 97 %  Assess: MEWS Score  MEWS Temp 0  MEWS Systolic 1  MEWS Pulse 0  MEWS RR 0  MEWS LOC 0  MEWS Score 1  MEWS Score Color Nyoka Cowden

## 2020-10-31 NOTE — Progress Notes (Signed)
[facility] Encounter Note  Patient: Shelly Ponce / Admit Date: 10/29/2020 / Date of Encounter: 10/31/2020, 6:28 AM   Subjective: Patient feels much better today and breathing much better since admission.  No evidence of anginal symptoms.  Heart rate well controlled with slight increased dose in metoprolol.  Anticoagulation continued with no bleeding complications.  Review of Systems: Positive for: None Negative for: Vision change, hearing change, syncope, dizziness, nausea, vomiting,diarrhea, bloody stool, stomach pain, cough, congestion, diaphoresis, urinary frequency, urinary pain,skin lesions, skin rashes Others previously listed  Objective: Telemetry: Atrial fibrillation with controlled ventricular rate Physical Exam: Blood pressure 99/68, pulse 66, temperature (!) 97.3 F (36.3 C), temperature source Axillary, resp. rate 18, height 5\' 1"  (1.549 m), weight 44.6 kg, SpO2 90 %. Body mass index is 18.59 kg/m. General: Well developed, well nourished, in no acute distress. Head: Normocephalic, atraumatic, sclera non-icteric, no xanthomas, nares are without discharge. Neck: No apparent masses Lungs: Normal respirations with no wheezes, no rhonchi, no rales , few crackles right base  Heart: Irregular rate and rhythm, normal S1 S2, no murmur, no rub, no gallop, PMI is normal size and placement, carotid upstroke normal without bruit, jugular venous pressure normal Abdomen: Soft, non-tender, non-distended with normoactive bowel sounds. No hepatosplenomegaly. Abdominal aorta is normal size without bruit Extremities: No edema, no clubbing, no cyanosis, no ulcers,  Peripheral: 2+ radial, 2+ femoral, 2+ dorsal pedal pulses Neuro: Alert and oriented. Moves all extremities spontaneously. Psych:  Responds to questions appropriately with a normal affect.   Intake/Output Summary (Last 24 hours) at 10/31/2020 0628 Last data filed at 10/30/2020 1500 Gross per 24 hour  Intake  373.42 ml  Output 100 ml  Net 273.42 ml    Inpatient Medications:  . apixaban  2.5 mg Oral BID  . cholecalciferol  1,000 Units Oral Daily  . docusate sodium  100 mg Oral BID  . estradiol  0.5 mg Oral Daily  . furosemide  40 mg Intravenous Daily  . HYDROcodone-acetaminophen  1 tablet Oral TID  . metoprolol succinate  50 mg Oral QHS  . multivitamin-lutein   Oral BID  . pantoprazole  40 mg Oral Daily  . progesterone  100 mg Oral Daily  . senna-docusate  1 tablet Oral Daily  . sodium chloride flush  3 mL Intravenous Q12H  . traZODone  50 mg Oral QHS   Infusions:  . sodium chloride      Labs: Recent Labs    10/30/20 0440 10/31/20 0512  NA 142 139  K 3.6 3.4*  CL 109 106  CO2 24 26  GLUCOSE 94 86  BUN 25* 24*  CREATININE 0.92 0.81  CALCIUM 8.3* 8.0*   Recent Labs    10/29/20 1331  AST 47*  ALT 18  ALKPHOS 52  BILITOT 1.2  PROT 6.3*  ALBUMIN 3.8   Recent Labs    10/29/20 1331 10/31/20 0512  WBC 7.4 5.8  HGB 12.9 11.5*  HCT 40.1 34.9*  MCV 99.5 99.4  PLT 222 169   No results for input(s): CKTOTAL, CKMB, TROPONINI in the last 72 hours. Invalid input(s): POCBNP No results for input(s): HGBA1C in the last 72 hours.   Weights: Filed Weights   10/29/20 1326 10/30/20 0500 10/31/20 0500  Weight: 45 kg 45.9 kg 44.6 kg     Radiology/Studies:  DG Chest 1 View  Result Date: 10/30/2020 CLINICAL DATA:  Left thoracentesis EXAM: CHEST  1 VIEW COMPARISON:  Yesterday FINDINGS: Essentially resolved left pleural effusion  after thoracentesis. No pneumothorax or re-expansion edema. When allowing for layering, presumably similar right pleural effusion. Cardiomegaly. IMPRESSION: Left thoracentesis without complicating feature. Electronically Signed   By: Monte Fantasia M.D.   On: 10/30/2020 11:04   DG Chest 2 View  Result Date: 10/29/2020 CLINICAL DATA:  Shortness of breath. EXAM: CHEST - 2 VIEW COMPARISON:  06/26/2017 FINDINGS: Normal sized heart. Interval  moderate-sized bilateral pleural effusions and bibasilar atelectasis. Thoracolumbar spine degenerative changes and scoliosis. IMPRESSION: Interval moderate-sized bilateral pleural effusions with bibasilar atelectasis. Electronically Signed   By: Claudie Revering M.D.   On: 10/29/2020 14:31   ECHOCARDIOGRAM COMPLETE  Result Date: 10/30/2020    ECHOCARDIOGRAM REPORT   Patient Name:   Shelly Ponce Date of Exam: 10/30/2020 Medical Rec #:  [id number]      Height:       61.0 in Accession #:    [id number]     Weight:       101.2 lb Date of Birth:  17-Jan-1933      BSA:          1.414 m Patient Age:    70 years       BP:           102/74 mmHg Patient Gender: F              HR:           75 bpm. Exam Location:  [facility] Procedure: 2D Echo, Color Doppler and Cardiac Doppler Indications:     I50.31 CHF-Acute Diastolic  History:         Patient has prior history of Echocardiogram examinations.                  Arrythmias:Atrial Fibrillation.  Sonographer:     Charmayne Sheer RDCS (AE) Referring Phys:  [id number] Collier Bullock Diagnosing Phys: Serafina Royals MD  Sonographer Comments: Suboptimal subcostal window. IMPRESSIONS  1. Left ventricular ejection fraction, by estimation, is 40 to 45%. The left ventricle has mildly decreased function. The left ventricle demonstrates global hypokinesis. The left ventricular internal cavity size was mildly dilated. Left ventricular diastolic parameters were normal.  2. Right ventricular systolic function is normal. The right ventricular size is normal.  3. Left atrial size was moderately dilated.  4. Right atrial size was moderately dilated.  5. The mitral valve is normal in structure. Mild mitral valve regurgitation.  6. The aortic valve is normal in structure. Aortic valve regurgitation is trivial. FINDINGS  Left Ventricle: Left ventricular ejection fraction, by estimation, is 40 to 45%. The left ventricle has mildly decreased function. The left ventricle demonstrates global hypokinesis. The left  ventricular internal cavity size was mildly dilated. There is  no left ventricular hypertrophy. Left ventricular diastolic parameters were normal. Right Ventricle: The right ventricular size is normal. No increase in right ventricular wall thickness. Right ventricular systolic function is normal. Left Atrium: Left atrial size was moderately dilated. Right Atrium: Right atrial size was moderately dilated. Pericardium: There is no evidence of pericardial effusion. Mitral Valve: The mitral valve is normal in structure. Mild mitral valve regurgitation. MV peak gradient, 4.0 mmHg. The mean mitral valve gradient is 1.0 mmHg. Tricuspid Valve: The tricuspid valve is normal in structure. Tricuspid valve regurgitation is mild. Aortic Valve: The aortic valve is normal in structure. Aortic valve regurgitation is trivial. Aortic valve mean gradient measures 1.0 mmHg. Aortic valve peak gradient measures 2.4 mmHg. Aortic valve area, by VTI measures 1.85 cm. Pulmonic Valve: The pulmonic  valve was not assessed. Pulmonic valve regurgitation is not visualized. Aorta: The aortic root and ascending aorta are structurally normal, with no evidence of dilitation. IAS/Shunts: No atrial level shunt detected by color flow Doppler.  LEFT VENTRICLE PLAX 2D LVIDd:         3.70 cm  Diastology LVIDs:         3.30 cm  LV e' medial:    6.20 cm/s LV PW:         1.00 cm  LV E/e' medial:  14.4 LV IVS:        0.70 cm  LV e' lateral:   9.79 cm/s LVOT diam:     1.70 cm  LV E/e' lateral: 9.1 LV SV:         25 LV SV Index:   18 LVOT Area:     2.27 cm  RIGHT VENTRICLE RV Basal diam:  3.00 cm LEFT ATRIUM             Index       RIGHT ATRIUM           Index LA diam:        3.50 cm 2.48 cm/m  RA Area:     24.00 cm LA Vol (A2C):   52.6 ml 37.20 ml/m RA Volume:   77.80 ml  55.02 ml/m LA Vol (A4C):   63.7 ml 45.05 ml/m LA Biplane Vol: 58.1 ml 41.09 ml/m  AORTIC VALVE                   PULMONIC VALVE AV Area (Vmax):    1.74 cm    PV Vmax:       0.54 m/s AV  Area (Vmean):   1.70 cm    PV Vmean:      37.400 cm/s AV Area (VTI):     1.85 cm    PV VTI:        0.092 m AV Vmax:           77.60 cm/s  PV Peak grad:  1.2 mmHg AV Vmean:          57.500 cm/s PV Mean grad:  1.0 mmHg AV VTI:            0.135 m AV Peak Grad:      2.4 mmHg AV Mean Grad:      1.0 mmHg LVOT Vmax:         59.40 cm/s LVOT Vmean:        43.100 cm/s LVOT VTI:          0.110 m LVOT/AV VTI ratio: 0.81  AORTA Ao Root diam: 3.30 cm MITRAL VALVE               TRICUSPID VALVE MV Area (PHT): 5.16 cm    TR Peak grad:   23.2 mmHg MV Area VTI:   1.16 cm    TR Vmax:        241.00 cm/s MV Peak grad:  4.0 mmHg MV Mean grad:  1.0 mmHg    SHUNTS MV Vmax:       1.00 m/s    Systemic VTI:  0.11 m MV Vmean:      50.2 cm/s   Systemic Diam: 1.70 cm MV Decel Time: 147 msec MV E velocity: 89.53 cm/s Serafina Royals MD Electronically signed by Serafina Royals MD Signature Date/Time: 10/30/2020/3:08:54 PM    Final    US THORACENTESIS ASP PLEURAL SPACE W/IMG GUIDE  Result Date: 10/30/2020  INDICATION: Patient admitted for shortness of breath and atrial fibrillation with rapid ventricular rate. Patient found to have bilateral pleural effusions and Interventional Radiology has been asked to perform a therapeutic and diagnostic thoracentesis. EXAM: ULTRASOUND GUIDED THORACENTESIS MEDICATIONS: 1% lidocaine 10 mL COMPLICATIONS: None immediate. PROCEDURE: An ultrasound guided thoracentesis was thoroughly discussed with the patient and questions answered. The benefits, risks, alternatives and complications were also discussed. The patient understands and wishes to proceed with the procedure. Written consent was obtained. Ultrasound was performed to localize and mark an adequate pocket of fluid in the left chest. The area was then prepped and draped in the normal sterile fashion. 1% Lidocaine was used for local anesthesia. Under ultrasound guidance a 6 Fr Safe-T-Centesis catheter was introduced. Thoracentesis was performed. The catheter  was removed and a dressing applied. FINDINGS: A total of approximately 550 mL of clear yellow fluid was removed. Samples were sent to the laboratory as requested by the clinical team. IMPRESSION: Successful ultrasound guided left thoracentesis yielding 550 mL of pleural fluid. Read by: Soyla Dryer, NP Electronically Signed   By: Ruthann Cancer MD   On: 10/30/2020 12:03     Assessment and Recommendation  87 y.o. female with acute on chronic diastolic dysfunction congestive heart failure with left pleural effusion status postthoracentesis hypertension hyperlipidemia and atrial fibrillation with rapid ventricular rate now more controlled with adjustments of medication 1.  Continuation of oral furosemide at 40 mg each day for pleural effusion and acute on chronic diastolic dysfunction congestive heart failure 2.  Continuation of current medical regimen heart rate control at 50 mg of metoprolol 3.  Anticoagulation for further risk reduction stroke with atrial fibrillation 4.  If ambulating well and no further significant symptoms okay for discharge home with follow-up in 1 to 2 weeks for further adjustments  Signed, Serafina Royals M.D. FACC

## 2020-10-31 NOTE — Progress Notes (Signed)
Patient requested for DNR bracelet to be removed. She stated she understands what DO Not Resuscitate means and she now would like to be a Full Code. Bracelet was removed and this RN sent message to MD H. Rivera Colon reference patient's preference and need for order to be changed.

## 2020-11-01 LAB — TRIGLYCERIDES, BODY FLUIDS: Triglycerides, Fluid: 12 mg/dL

## 2020-11-01 LAB — BASIC METABOLIC PANEL
Anion gap: 8 (ref 5–15)
BUN: 24 mg/dL — ABNORMAL HIGH (ref 8–23)
CO2: 24 mmol/L (ref 22–32)
Calcium: 8.2 mg/dL — ABNORMAL LOW (ref 8.9–10.3)
Chloride: 106 mmol/L (ref 98–111)
Creatinine, Ser: 0.82 mg/dL (ref 0.44–1.00)
GFR, Estimated: >60 mL/min (ref >60)
Glucose, Bld: 85 mg/dL (ref 70–99)
Potassium: 3.6 mmol/L (ref 3.5–5.1)
Sodium: 138 mmol/L (ref 135–145)

## 2020-11-01 LAB — MAGNESIUM: Magnesium: 2.1 mg/dL (ref 1.7–2.4)

## 2020-11-01 LAB — PH, BODY FLUID: pH, Body Fluid: 7.8

## 2020-11-01 LAB — PROTEIN, BODY FLUID (OTHER): Total Protein, Body Fluid Other: 1.7 g/dL

## 2020-11-01 LAB — CBC
HCT: 37.2 % (ref 36.0–46.0)
Hemoglobin: 12.3 g/dL (ref 12.0–15.0)
MCH: 32.2 pg (ref 26.0–34.0)
MCHC: 33.1 g/dL (ref 30.0–36.0)
MCV: 97.4 fL (ref 80.0–100.0)
Platelets: 199 10*3/uL (ref 150–400)
RBC: 3.82 MIL/uL — ABNORMAL LOW (ref 3.87–5.11)
RDW: 15.8 % — ABNORMAL HIGH (ref 11.5–15.5)
WBC: 5 10*3/uL (ref 4.0–10.5)
nRBC: 0 % (ref 0.0–0.2)

## 2020-11-01 NOTE — Progress Notes (Signed)
Patient ID: Shelly Ponce, female   DOB: Aug 27, 1932, 87 y.o.   MRN: [id number] [facility] PROGRESS NOTE  Shelly Ponce DJS:[id number] DOB: 1933/03/09 DOA: 10/29/2020 PCP: Adin Hector, MD  HPI/Subjective: Patient feeling better than when she came in.  Able to breathe better.    Objective: Vitals:   11/01/20 0529 11/01/20 0849  BP: (!) 110/93 112/79  Pulse: 76 96  Resp: 18 18  Temp:  98.6 F (37 C)  SpO2: 90% 94%    Intake/Output Summary (Last 24 hours) at 11/01/2020 1342 Last data filed at 11/01/2020 0900 Gross per 24 hour  Intake 600 ml  Output 800 ml  Net -200 ml   Filed Weights   10/30/20 0500 10/31/20 0500 10/31/20 0826  Weight: 45.9 kg 44.6 kg 42.7 kg    ROS: Review of Systems  Respiratory: Positive for cough and shortness of breath.   Cardiovascular: Negative for chest pain.  Gastrointestinal: Negative for abdominal pain, nausea and vomiting.   Exam: Physical Exam HENT:     Head: Normocephalic.     Mouth/Throat:     Pharynx: No oropharyngeal exudate.  Eyes:     General: Lids are normal.     Conjunctiva/sclera: Conjunctivae normal.     Pupils: Pupils are equal, round, and reactive to light.  Cardiovascular:     Rate and Rhythm: Normal rate. Rhythm irregularly irregular.     Heart sounds: Normal heart sounds, S1 normal and S2 normal.  Pulmonary:     Breath sounds: Examination of the right-middle field reveals decreased breath sounds. Examination of the right-lower field reveals decreased breath sounds and rhonchi. Examination of the left-lower field reveals decreased breath sounds and rhonchi. Decreased breath sounds and rhonchi present. No wheezing or rales.  Abdominal:     Palpations: Abdomen is soft.     Tenderness: There is no abdominal tenderness.  Musculoskeletal:     Right lower leg: No swelling.     Left lower leg: No swelling.  Skin:    General: Skin is warm.     Findings: No rash.  Neurological:     Mental Status: She is alert and  oriented to person, place, and time.       Data Reviewed: Basic Metabolic Panel: Recent Labs  Lab 10/29/20 1331 10/30/20 0440 10/31/20 0512 11/01/20 0522  NA 139 142 139 138  K 4.9 3.6 3.4* 3.6  CL 109 109 106 106  CO2 19* 24 26 24   GLUCOSE 135* 94 86 85  BUN 23 25* 24* 24*  CREATININE 0.90 0.92 0.81 0.82  CALCIUM 9.0 8.3* 8.0* 8.2*  MG  --   --   --  2.1   Liver Function Tests: Recent Labs  Lab 10/29/20 1331  AST 47*  ALT 18  ALKPHOS 52  BILITOT 1.2  PROT 6.3*  ALBUMIN 3.8   CBC: Recent Labs  Lab 10/29/20 1331 10/31/20 0512 11/01/20 0522  WBC 7.4 5.8 5.0  HGB 12.9 11.5* 12.3  HCT 40.1 34.9* 37.2  MCV 99.5 99.4 97.4  PLT 222 169 199   BNP (last 3 results) Recent Labs    10/29/20 1331  BNP 410.0*      Recent Results (from the past 240 hour(s))  SARS CORONAVIRUS 2 (TAT 6-24 HRS) Nasopharyngeal Nasopharyngeal Swab     Status: None   Collection Time: 10/29/20  3:42 PM   Specimen: Nasopharyngeal Swab  Result Value Ref Range Status   SARS Coronavirus 2 NEGATIVE NEGATIVE Final  Comment: (NOTE) SARS-CoV-2 target nucleic acids are NOT DETECTED.  The SARS-CoV-2 RNA is generally detectable in upper and lower respiratory specimens during the acute phase of infection. Negative results do not preclude SARS-CoV-2 infection, do not rule out co-infections with other pathogens, and should not be used as the sole basis for treatment or other patient management decisions. Negative results must be combined with clinical observations, patient history, and epidemiological information. The expected result is Negative.  Fact Sheet for Patients: SugarRoll.be  Fact Sheet for Healthcare Providers: https://www.woods-mathews.com/  This test is not yet approved or cleared by the [place] FDA and  has been authorized for detection and/or diagnosis of SARS-CoV-2 by FDA under an Emergency Use Authorization (EUA). This EUA  will remain  in effect (meaning this test can be used) for the duration of the COVID-19 declaration under Se ction 564(b)(1) of the Act, 21 U.S.C. section 360bbb-3(b)(1), unless the authorization is terminated or revoked sooner.  Performed at [facility], [place] [address], [place], [place] [address]   Body fluid culture w Gram Stain     Status: None (Preliminary result)   Collection Time: 10/30/20 10:42 AM   Specimen: PATH Cytology Pleural fluid  Result Value Ref Range Status   Specimen Description   Final    PLEURAL Performed at [facility], [address], [place], [place] [address]    Special Requests   Final    LEFT PLEURAL FOOT Performed at [facility], [place], [place] [address]    Gram Stain NO WBC SEEN NO ORGANISMS SEEN   Final   Culture   Final    NO GROWTH 2 DAYS Performed at [facility], [place] [address] [place], [place] [address]    Report Status PENDING  Incomplete      Scheduled Meds: . amiodarone  200 mg Oral BID  . apixaban  2.5 mg Oral BID  . cholecalciferol  1,000 Units Oral Daily  . docusate sodium  100 mg Oral BID  . estradiol  0.5 mg Oral Daily  . fluticasone  2 spray Each Nare Daily  . furosemide  20 mg Oral BID  . HYDROcodone-acetaminophen  1 tablet Oral TID  . metoprolol succinate  50 mg Oral BID  . multivitamin-lutein   Oral BID  . pantoprazole  40 mg Oral Daily  . potassium chloride  20 mEq Oral Daily  . progesterone  100 mg Oral Daily  . senna-docusate  1 tablet Oral Daily  . sodium chloride flush  3 mL Intravenous Q12H  . traZODone  50 mg Oral QHS   Continuous Infusions: . sodium chloride      Assessment/Plan:  1. Rapid atrial fibrillation, chronic in nature.  Last night heart rates went up above 140.  Amiodarone was added to the patient's Toprol-XL.  Heart rates trending better today in the 100s.  Ambulate today. 2. Bilateral pleural effusion.  Likely secondary to CHF.  Status post  left-sided thoracentesis that removed 550 mL underneath the left lung on Friday.  We will get right thoracentesis on Monday. 3. 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  Blood pressure little too low for numerous medications.  On Toprol-XL and low-dose Lasix. 4. Acute hypoxic respiratory failure has resolved 5. GERD on Protonix 6. Appreciate physical therapy evaluation  Code Status:     Code Status Orders  (From admission, onward)         Start     Ordered  10/31/20 1028  Full code  Continuous        10/31/20 1028        Code Status History    Date Active Date Inactive Code Status Order ID Comments User Context   10/29/2020 1638 10/31/2020 1028 DNR [id number]  Collier Bullock, MD ED   06/26/2017 1607 06/27/2017 1925 DNR [id number]  Fritzi Mandes, MD Inpatient   05/15/2015 1940 05/23/2015 1836 DNR [id number]  [name], MD Inpatient   Advance Care Planning Activity    Advance Directive Documentation   Flowsheet Row Most Recent Value  Type of Advance Directive Living will  Pre-existing out of facility DNR order (yellow form or pink MOST form) --  "MOST" Form in Place? --     Family Communication: Spoke with friend at the bedside Disposition Plan: Status is: Inpatient  Dispo: The patient is from: Home              Anticipated d/c is to: Home              Patient currently will be evaluated for right thoracentesis on Monday and potentially home after that   Difficult to place patient.  No.  Time spent: 27 minutes  [place]

## 2020-11-02 ENCOUNTER — Inpatient Hospital Stay: Payer: Medicare Other

## 2020-11-02 LAB — BODY FLUID CULTURE W GRAM STAIN
Culture: NO GROWTH
Gram Stain: NONE SEEN

## 2020-11-02 LAB — BASIC METABOLIC PANEL
Anion gap: 9 (ref 5–15)
BUN: 24 mg/dL — ABNORMAL HIGH (ref 8–23)
CO2: 27 mmol/L (ref 22–32)
Calcium: 8.6 mg/dL — ABNORMAL LOW (ref 8.9–10.3)
Chloride: 107 mmol/L (ref 98–111)
Creatinine, Ser: 1.05 mg/dL — ABNORMAL HIGH (ref 0.44–1.00)
GFR, Estimated: 52 mL/min — ABNORMAL LOW (ref >60)
Glucose, Bld: 99 mg/dL (ref 70–99)
Potassium: 4.4 mmol/L (ref 3.5–5.1)
Sodium: 143 mmol/L (ref 135–145)

## 2020-11-02 LAB — BODY FLUID CELL COUNT WITH DIFFERENTIAL
Eos, Fluid: 0 %
Lymphs, Fluid: 72 %
Monocyte-Macrophage-Serous Fluid: 24 %
Neutrophil Count, Fluid: 4 %
Total Nucleated Cell Count, Fluid: 793 cu mm

## 2020-11-02 LAB — PROTEIN, PLEURAL OR PERITONEAL FLUID: Total protein, fluid: <3 g/dL

## 2020-11-02 LAB — LACTATE DEHYDROGENASE, PLEURAL OR PERITONEAL FLUID: LD, Fluid: 66 U/L — ABNORMAL HIGH (ref 3–23)

## 2020-11-02 LAB — CYTOLOGY - NON PAP

## 2020-11-02 MED ORDER — METOPROLOL SUCCINATE ER 50 MG PO TB24: 50.0000 mg | ORAL_TABLET | Freq: Two times a day (BID) | ORAL | 0 refills | Status: DC

## 2020-11-02 MED ORDER — FUROSEMIDE 20 MG PO TABS
20.0000 mg | ORAL_TABLET | Freq: Every day | ORAL | Status: DC
  Filled 2020-11-02: qty 1

## 2020-11-02 MED ORDER — AMIODARONE HCL 200 MG PO TABS: 200.0000 mg | ORAL_TABLET | Freq: Two times a day (BID) | ORAL | 0 refills | Status: DC

## 2020-11-02 MED ORDER — FLUTICASONE PROPIONATE 50 MCG/ACT NA SUSP: 2.0000 | Freq: Every day | NASAL | 0 refills | Status: AC

## 2020-11-02 MED ORDER — FUROSEMIDE 20 MG PO TABS: 20.0000 mg | ORAL_TABLET | Freq: Every day | ORAL | 0 refills | Status: AC

## 2020-11-02 MED ORDER — POTASSIUM CHLORIDE CRYS ER 10 MEQ PO TBCR
10.0000 meq | EXTENDED_RELEASE_TABLET | Freq: Every day | ORAL | 0 refills | Status: AC
Start: 2020-11-03 — End: ?

## 2020-11-02 NOTE — Discharge Summary (Signed)
[facility] [place] [facility]   PATIENT NAME: Shelly Ponce    MR#:  [id number]  DATE OF BIRTH:  1933-05-02  DATE OF ADMISSION:  10/29/2020 ADMITTING PHYSICIAN: Lucile Shutters, MD  DATE OF DISCHARGE: 11/02/2020  PRIMARY CARE PHYSICIAN: Lynnea Ferrier, MD    ADMISSION DIAGNOSIS:  Hypoxia [R09.02] Atrial fibrillation with RVR (HCC) [I48.91] Acute diastolic CHF (congestive heart failure) (HCC) [I50.31] Acute congestive heart failure, unspecified heart failure type (HCC) [I50.9]  DISCHARGE DIAGNOSIS:  Principal Problem:   Acute diastolic CHF (congestive heart failure) (HCC) Active Problems:   Acid reflux   Atrial fibrillation, chronic (HCC)   Acute respiratory failure (HCC)   Bilateral pleural effusion   Weakness   Atrial fibrillation with RVR (HCC)   Acute on chronic combined systolic and diastolic CHF (congestive heart failure) (HCC)   SECONDARY DIAGNOSIS:   Past Medical History:  Diagnosis Date  . Absolute anemia 05/26/2015  . Acid reflux 05/26/2015  . Actinic keratosis   . Arthritis   . Atrial fibrillation, chronic (HCC)   . Basal cell carcinoma 12/11/2008   right post neck  . Basal cell carcinoma 08/17/2009   left post lat base of neck  . Basal cell carcinoma 10/26/2012   left post base of neck. Excised 12/05/2012, margins free  . Basal cell carcinoma 09/27/2017   spinal mid back. EDC, EXC 10/06/20  . Basal cell carcinoma 02/11/2019   right mid to upper back paraspinal. Nodular. EDC  . BP (high blood pressure) 05/26/2015  . Bradycardia   . CA of skin 05/26/2015  . Chronic constipation 05/26/2015  . Cyst and pseudocyst of pancreas 06/02/2013  . Edema leg 05/26/2015   Overview:  progressive   . Osteoarthritis   . Risk for falls   . Scoliosis   . Sepsis (HCC) 05/15/2015  . Squamous cell carcinoma of skin 03/25/2013   right prox dorsum forearm (in situ)  . Squamous cell carcinoma of skin 10/25/2017   left sup lat forehead.   .  Squamous cell carcinoma of skin 07/30/2019   right index finger. WD SCC. EDc.  Marland Kitchen Squamous cell carcinoma of skin 04/09/2020   right lower leg, anterior. WD SCC with superficial infiltration. EDC.    HOSPITAL COURSE:   1.  Acute hypoxic respiratory failure on presentation with pulse ox 87% on room air.  Patient now breathing comfortably on room air.  This problem has resolved. 2.  Rapid atrial fibrillation chronic in nature.  Patient already on Eliquis for anticoagulation.  Patient was started on Toprol XL twice a day and amiodarone was added also.Marland Kitchen  Resting heart rate ranging between 104 and 120.  With ambulation heart rate up to 135. 3.  Bilateral pleural effusions.  Patient had a left-sided thoracentesis which removed 500 mL on 10/30/2020.  On 11/02/2020 had a right-sided thoracentesis.  Chest x-ray postprocedure shows no pneumothorax.  Can consider referral to pulmonology if pleural effusions recur.  Follow-up pending laboratory data from pleural fluid as outpatient. 4.  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  Blood pressure too low for other heart failure medications.  Continue Toprol-XL and low-dose Lasix.  EF shows a ejection fraction 40 to 45%.  CHF clinic referral.  Seen in consultation by Dr. Gwen Pounds.  Can consider ARB and spironolactone as outpatient if blood pressure improves. 5.  GERD on Protonix 6.  Physical therapy recommending home health. 7.  Creatinine increased slightly to 1.05 with twice a day Lasix cut down  to once a day Lasix.  Recommend checking a BMP as outpatient.  Chronic kidney disease stage II borderline stage IIIa.  DISCHARGE CONDITIONS:   Satisfactory  CONSULTS OBTAINED:  Treatment Team:  Corey Skains, MD  DRUG ALLERGIES:   Allergies  Allergen Reactions  . Macrobid [Nitrofurantoin] Rash  . Sulfa Antibiotics Rash    DISCHARGE MEDICATIONS:   Allergies as of 11/02/2020      Reactions   Macrobid [nitrofurantoin] Rash   Sulfa  Antibiotics Rash      Medication List    STOP taking these medications   diltiazem 240 MG 24 hr capsule Commonly known as: CARDIZEM CD   mupirocin ointment 2 % Commonly known as: BACTROBAN     TAKE these medications   acetaminophen 500 MG tablet Commonly known as: TYLENOL Take 500 mg by mouth every 8 (eight) hours as needed for mild pain.   amiodarone 200 MG tablet Commonly known as: PACERONE Take 1 tablet (200 mg total) by mouth 2 (two) times daily.   apixaban 2.5 MG Tabs tablet Commonly known as: ELIQUIS Take 1 tablet (2.5 mg total) by mouth 2 (two) times daily.   carbamide peroxide 6.5 % OTIC solution Commonly known as: DEBROX Place 5 drops into both ears 2 (two) times daily.   cholecalciferol 25 MCG (1000 UNIT) tablet Commonly known as: VITAMIN D3 Take 1,000 Units by mouth daily.   diclofenac Sodium 1 % Gel Commonly known as: VOLTAREN Apply 2 g topically 3 (three) times daily as needed (pain).   docusate sodium 100 MG capsule Commonly known as: COLACE Take 100 mg by mouth 2 (two) times daily.   estradiol 0.5 MG tablet Commonly known as: ESTRACE Take 0.5 mg by mouth daily.   fluticasone 50 MCG/ACT nasal spray Commonly known as: FLONASE Place 2 sprays into both nostrils daily. Start taking on: Nov 03, 2020   furosemide 20 MG tablet Commonly known as: LASIX Take 1 tablet (20 mg total) by mouth daily. Start taking on: Nov 03, 2020   HYDROcodone-acetaminophen 10-325 MG tablet Commonly known as: NORCO Take 1 tablet by mouth 3 (three) times daily.   hydrocortisone cream 1 % Apply 1 application topically daily as needed for itching.   ketoconazole 2 % cream Commonly known as: NIZORAL Apply 1 application topically 2 (two) times daily as needed for irritation.   metoprolol succinate 50 MG 24 hr tablet Commonly known as: TOPROL-XL Take 1 tablet (50 mg total) by mouth 2 (two) times daily. Take with or immediately following a meal.   ondansetron 4 MG  tablet Commonly known as: ZOFRAN Take 4 mg by mouth every 6 (six) hours as needed for nausea or vomiting.   pantoprazole 40 MG tablet Commonly known as: PROTONIX Take 40 mg by mouth daily.   potassium chloride 10 MEQ tablet Commonly known as: KLOR-CON Take 1 tablet (10 mEq total) by mouth daily. Start taking on: Nov 03, 2020   PRESERVISION AREDS PO Take 1 capsule by mouth 2 (two) times daily.   multivitamin-iron-minerals-folic acid Tabs tablet Take 1 tablet by mouth daily.   progesterone 100 MG capsule Commonly known as: PROMETRIUM Take 100 mg by mouth daily. Take 100 mg (1 capsule) by mouth daily for 15 days. Do not take for 15 more days. Then start taking again for 15 days.   senna-docusate 8.6-50 MG tablet Commonly known as: Senokot-S Take 1 tablet by mouth daily.   traZODone 50 MG tablet Commonly known as: DESYREL Take 50 mg by mouth  at bedtime.   triamcinolone cream 0.1 % Commonly known as: KENALOG Apply 1 application topically 2 (two) times daily as needed (skin irritations).        DISCHARGE INSTRUCTIONS:   Follow-up PMD 5 days Follow-up Dr. Nehemiah Massed cardiology 1 week Follow-up CHF clinic  If you experience worsening of your admission symptoms, develop shortness of breath, life threatening emergency, suicidal or homicidal thoughts you must seek medical attention immediately by calling 911 or calling your MD immediately  if symptoms less severe.  You Must read complete instructions/literature along with all the possible adverse reactions/side effects for all the Medicines you take and that have been prescribed to you. Take any new Medicines after you have completely understood and accept all the possible adverse reactions/side effects.   Please note  You were cared for by a hospitalist during your hospital stay. If you have any questions about your discharge medications or the care you received while you were in the hospital after you are discharged, you can  call the unit and asked to speak with the hospitalist on call if the hospitalist that took care of you is not available. Once you are discharged, your primary care physician will handle any further medical issues. Please note that NO REFILLS for any discharge medications will be authorized once you are discharged, as it is imperative that you return to your primary care physician (or establish a relationship with a primary care physician if you do not have one) for your aftercare needs so that they can reassess your need for medications and monitor your lab values.    Today   CHIEF COMPLAINT:   Chief Complaint  Patient presents with  . Atrial Fibrillation    HISTORY OF PRESENT ILLNESS:  Shirle Starkes  is a 87 y.o. female came in with shortness of breath and found to be in rapid atrial fibrillation   VITAL SIGNS:  Blood pressure 113/87, pulse 88, temperature 98 F (36.7 C), temperature source Oral, resp. rate 16, height 5\' 1"  (1.549 m), weight 41.1 kg, SpO2 98 %.  I/O:    Intake/Output Summary (Last 24 hours) at 11/02/2020 1203 Last data filed at 11/01/2020 2322 Gross per 24 hour  Intake 240 ml  Output 500 ml  Net -260 ml    PHYSICAL EXAMINATION:  GENERAL:  87 y.o.-year-old patient lying in the bed with no acute distress.  EYES: Pupils equal, round, reactive to light and accommodation. No scleral icterus. HEENT: Head atraumatic, normocephalic. Oropharynx and nasopharynx clear.  LUNGS: Decrease breath sounds bilateral base, no wheezing, rales,rhonchi or crepitation. No use of accessory muscles of respiration.  CARDIOVASCULAR: S1, S2 regular regular tachycardic. No murmurs, rubs, or gallops.  ABDOMEN: Soft, non-tender, non-distended. Bowel sounds present. No organomegaly or mass.  EXTREMITIES: No pedal edema, cyanosis, or clubbing.  NEUROLOGIC: Cranial nerves II through XII are intact. Muscle strength 5/5 in all extremities. Sensation intact. Gait not checked.  PSYCHIATRIC: The  patient is alert and oriented x 3.  SKIN: No obvious rash, lesion, or ulcer.   DATA REVIEW:   CBC Recent Labs  Lab 11/01/20 0522  WBC 5.0  HGB 12.3  HCT 37.2  PLT 199    Chemistries  Recent Labs  Lab 10/29/20 1331 10/30/20 0440 11/01/20 0522 11/02/20 0329  NA 139   < > 138 143  K 4.9   < > 3.6 4.4  CL 109   < > 106 107  CO2 19*   < > 24 27  GLUCOSE 135*   < >  85 99  BUN 23   < > 24* 24*  CREATININE 0.90   < > 0.82 1.05*  CALCIUM 9.0   < > 8.2* 8.6*  MG  --   --  2.1  --   AST 47*  --   --   --   ALT 18  --   --   --   ALKPHOS 52  --   --   --   BILITOT 1.2  --   --   --    < > = values in this interval not displayed.    Microbiology Results  Results for orders placed or performed during the hospital encounter of 10/29/20  SARS CORONAVIRUS 2 (TAT 6-24 HRS) Nasopharyngeal Nasopharyngeal Swab     Status: None   Collection Time: 10/29/20  3:42 PM   Specimen: Nasopharyngeal Swab  Result Value Ref Range Status   SARS Coronavirus 2 NEGATIVE NEGATIVE Final    Comment: (NOTE) SARS-CoV-2 target nucleic acids are NOT DETECTED.  The SARS-CoV-2 RNA is generally detectable in upper and lower respiratory specimens during the acute phase of infection. Negative results do not preclude SARS-CoV-2 infection, do not rule out co-infections with other pathogens, and should not be used as the sole basis for treatment or other patient management decisions. Negative results must be combined with clinical observations, patient history, and epidemiological information. The expected result is Negative.  Fact Sheet for Patients: SugarRoll.be  Fact Sheet for Healthcare Providers: https://www.woods-mathews.com/  This test is not yet approved or cleared by the [place] FDA and  has been authorized for detection and/or diagnosis of SARS-CoV-2 by FDA under an Emergency Use Authorization (EUA). This EUA will remain  in effect (meaning this  test can be used) for the duration of the COVID-19 declaration under Se ction 564(b)(1) of the Act, 21 U.S.C. section 360bbb-3(b)(1), unless the authorization is terminated or revoked sooner.  Performed at [facility], [place] [address] [place], [place] [address]   Body fluid culture w Gram Stain     Status: None   Collection Time: 10/30/20 10:42 AM   Specimen: PATH Cytology Pleural fluid  Result Value Ref Range Status   Specimen Description   Final    PLEURAL Performed at [facility], [address] [place], [place] [address]    Special Requests   Final    LEFT PLEURAL FOOT Performed at [facility], [place], [place] [address]    Gram Stain NO WBC SEEN NO ORGANISMS SEEN   Final   Culture   Final    NO GROWTH 3 DAYS Performed at [facility], [place] [address], [place], [place] [address]    Report Status 11/02/2020 FINAL  Final    RADIOLOGY:  DG Chest Port 1 View  Result Date: 11/02/2020 CLINICAL DATA:  Right thoracentesis EXAM: PORTABLE CHEST 1 VIEW COMPARISON:  10/30/2020 FINDINGS: Stable cardiomediastinal contours. Atherosclerotic calcification of the aortic knob. Interval reduction of right-sided pleural effusion. Trace bilateral pleural effusions persist. No pneumothorax. Mild bibasilar atelectasis. IMPRESSION: No pneumothorax following right-sided thoracentesis. Electronically Signed   By: Davina Poke D.O.   On: 11/02/2020 11:32      Management plans discussed with the patient, family and they are in agreement.  CODE STATUS:     Code Status Orders  (From admission, onward)         Start     Ordered   10/31/20 1028  Full code  Continuous  10/31/20 1028        Code Status History    Date Active Date Inactive Code Status Order ID Comments User Context   10/29/2020 1638 10/31/2020 1028 DNR [id number]  Collier Bullock, MD ED   06/26/2017 1607 06/27/2017 1925 DNR [id number]  Fritzi Mandes, MD Inpatient    05/15/2015 1940 05/23/2015 1836 DNR [id number]  [name], MD Inpatient   Advance Care Planning Activity    Advance Directive Documentation   Flowsheet Row Most Recent Value  Type of Advance Directive Living will  Pre-existing out of facility DNR order (yellow form or pink MOST form) --  "MOST" Form in Place? --      TOTAL TIME TAKING CARE OF THIS PATIENT: 35 minutes.    Loletha Grayer M.D on 11/02/2020 at 12:03 PM  Between 7am to 6pm - Pager - [phone]  After 6pm go to [email or web address] - password [email or web address]  [facility]  CC: Primary care physician; Adin Hector, MD

## 2020-11-02 NOTE — TOC Transition Note (Addendum)
Transition of Care [facility]) - CM/SW Discharge Note   Patient Details  Name: Shelly Ponce MRN: [id number] Date of Birth: [birth year]  [facility]) CM/SW Contact:  Alberteen Sam, LCSW Phone Number: 11/02/2020, 2:43 PM   Clinical Narrative:      Patient will DC to: [facility] Anticipated DC date: 11/02/2020 Family notified: friend Surveyor, quantity by: friend [name]  Per MD patient ready for DC to [facility] . RN, patient, patient's family, and facility notified of DC. Discharge Summary faxed to facility and sent physical copy in dc packet with [name] who will transport patient back to facility. Included in this packet is DC summary, FL2, facesheet, and home health orders and face to face to be given to therapy manager Merry Proud for patient to be set up with [facility] through [facility].   CSW consulted for scale, spoke with [name] at [facility] that they have scales at facility for use. None needed to be supplied to patient at this time.   CSW signing off.  Pricilla Riffle, LCSW   Final next level of care: Assisted Living Barriers to Discharge: No Barriers Identified   Patient Goals and CMS Choice Patient states their goals for this hospitalization and ongoing recovery are:: to go back to [facility] CMS Medicare.gov Compare Post Acute Care list provided to:: Patient Choice offered to / list presented to : Patient  Discharge Placement                Patient to be transferred to facility by: friend [name] Name of family member notified: [name] Patient and family notified of of transfer: 11/02/20  Discharge Plan and Services   Discharge Planning Services: CM Consult            DME Arranged: N/A DME Agency: NA       HH Arranged: PT,OT [place] Agency:  Investment banker, corporate at [employer])        Social Determinants of Health (SDOH) Interventions     Readmission Risk Interventions No flowsheet data found.

## 2020-11-02 NOTE — NC FL2 (Signed)
[place] LEVEL OF CARE SCREENING TOOL     IDENTIFICATION  Patient Name: Shelly Ponce Birthdate: Aug 27, 1932 Sex: female Admission Date (Current Location): 10/29/2020  [place] and [place] Number:  Engineering geologist and Address:  [facility], [address] [place], [place] [address]      Provider Number: [id number]  Attending Physician Name and Address:  Loletha Grayer, MD  Relative Name and Phone Number:  [name] (friend) [phone]    Current Level of Care: Hospital Recommended Level of Care: [facility]) Prior Approval Number:    Date Approved/Denied:   PASRR Number:    Discharge Plan: Other (Comment) (ALF - [facility])    Current Diagnoses: Patient Active Problem List   Diagnosis Date Noted  . Atrial fibrillation with RVR ([place])   . Acute on chronic combined systolic and diastolic CHF (congestive heart failure) ([place])   . Bilateral pleural effusion   . Weakness   . Acute diastolic CHF (congestive heart failure) ([place]) 10/29/2020  . Acute respiratory failure ([place])   . Protein-calorie malnutrition, severe 06/27/2017  . Atrial fibrillation, chronic ([place]) 06/26/2017  . Absolute anemia 05/26/2015  . Chronic constipation 05/26/2015  . Acid reflux 05/26/2015  . BP (high blood pressure) 05/26/2015  . Edema leg 05/26/2015  . OP (osteoporosis) 05/26/2015  . Dupuytren's contracture of foot 05/26/2015  . CA of skin 05/26/2015  . Abdominal pain   . Sepsis ([place]) 05/15/2015  . UTI (lower urinary tract infection) 05/15/2015  . Bursitis, trochanteric 01/22/2015  . Degeneration of intervertebral disc of lumbar region 01/23/2014  . Neuritis or radiculitis due to rupture of lumbar intervertebral disc 01/22/2014  . Cyst and pseudocyst of pancreas 06/02/2013    Orientation RESPIRATION BLADDER Height & Weight        Normal Continent Weight: 90 lb 11.2 oz (41.1 kg) Height:  5\' 1"  (154.9 cm)  BEHAVIORAL  SYMPTOMS/MOOD NEUROLOGICAL BOWEL NUTRITION STATUS      Continent Diet (normal)  AMBULATORY STATUS COMMUNICATION OF NEEDS Skin   Limited Assist Verbally Normal                       Personal Care Assistance Level of Assistance  Bathing,Dressing,Total care,Feeding Bathing Assistance: Limited assistance Feeding assistance: Independent Dressing Assistance: Limited assistance Total Care Assistance: Limited assistance   Functional Limitations Info  Sight,Hearing,Speech Sight Info: Impaired Hearing Info: Impaired Speech Info: Adequate    SPECIAL CARE FACTORS FREQUENCY  PT (By licensed PT),OT (By licensed OT)     PT Frequency: min 2x weekly OT Frequency: min 2x weekly            Contractures Contractures Info: Not present    Additional Factors Info  Code Status,Allergies Code Status Info: full Allergies Info: Macrobid (nitrofurantoin), sulfa antibiotics              TAKE these medications   acetaminophen 500 MG tablet Commonly known as: TYLENOL Take 500 mg by mouth every 8 (eight) hours as needed for mild pain.   amiodarone 200 MG tablet Commonly known as: PACERONE Take 1 tablet (200 mg total) by mouth 2 (two) times daily.   apixaban 2.5 MG Tabs tablet Commonly known as: ELIQUIS Take 1 tablet (2.5 mg total) by mouth 2 (two) times daily.   carbamide peroxide 6.5 % OTIC solution Commonly known as: DEBROX Place 5 drops into both ears 2 (two) times daily.   cholecalciferol 25 MCG (1000 UNIT) tablet Commonly  known as: VITAMIN D3 Take 1,000 Units by mouth daily.   diclofenac Sodium 1 % Gel Commonly known as: VOLTAREN Apply 2 g topically 3 (three) times daily as needed (pain).   docusate sodium 100 MG capsule Commonly known as: COLACE Take 100 mg by mouth 2 (two) times daily.   estradiol 0.5 MG tablet Commonly known as: ESTRACE Take 0.5 mg by mouth daily.   fluticasone 50 MCG/ACT nasal spray Commonly known as: FLONASE Place 2 sprays into  both nostrils daily. Start taking on: Nov 03, 2020   furosemide 20 MG tablet Commonly known as: LASIX Take 1 tablet (20 mg total) by mouth daily. Start taking on: Nov 03, 2020   HYDROcodone-acetaminophen 10-325 MG tablet Commonly known as: NORCO Take 1 tablet by mouth 3 (three) times daily.   hydrocortisone cream 1 % Apply 1 application topically daily as needed for itching.   ketoconazole 2 % cream Commonly known as: NIZORAL Apply 1 application topically 2 (two) times daily as needed for irritation.   metoprolol succinate 50 MG 24 hr tablet Commonly known as: TOPROL-XL Take 1 tablet (50 mg total) by mouth 2 (two) times daily. Take with or immediately following a meal.   ondansetron 4 MG tablet Commonly known as: ZOFRAN Take 4 mg by mouth every 6 (six) hours as needed for nausea or vomiting.   pantoprazole 40 MG tablet Commonly known as: PROTONIX Take 40 mg by mouth daily.   potassium chloride 10 MEQ tablet Commonly known as: KLOR-CON Take 1 tablet (10 mEq total) by mouth daily. Start taking on: Nov 03, 2020   PRESERVISION AREDS PO Take 1 capsule by mouth 2 (two) times daily.   multivitamin-iron-minerals-folic acid Tabs tablet Take 1 tablet by mouth daily.   progesterone 100 MG capsule Commonly known as: PROMETRIUM Take 100 mg by mouth daily. Take 100 mg (1 capsule) by mouth daily for 15 days. Do not take for 15 more days. Then start taking again for 15 days.   senna-docusate 8.6-50 MG tablet Commonly known as: Senokot-S Take 1 tablet by mouth daily.   traZODone 50 MG tablet Commonly known as: DESYREL Take 50 mg by mouth at bedtime.   triamcinolone cream 0.1 % Commonly known as: KENALOG Apply 1 application topically 2 (two) times daily as needed (skin irritations).       Additional Information SSN: [id number]  Alberteen Sam, LCSW

## 2020-11-02 NOTE — Discharge Instructions (Signed)

## 2020-11-02 NOTE — Progress Notes (Signed)
OT Cancellation Note  Patient Details Name: Shelly Ponce MRN: [id number] DOB: [birth year]   Cancelled Treatment:    Reason Eval/Treat Not Completed: Patient at procedure or test/ unavailable. Pt off the unit at this time. OT will follow up and re-attempt when pt is next available.  Darleen Crocker, [place], OTR/L , CBIS ascom [phone]  11/02/20, 10:23 AM   11/02/2020, 10:22 AM

## 2020-11-02 NOTE — TOC Progression Note (Addendum)
Transition of Care [facility]) - Progression Note    Patient Details  Name: JONEISHA MILES MRN: [id number] Date of Birth: June 03, 1933  Transition of [facility]) CM/SW [place], [place] Phone Number: 11/02/2020, 9:35 AM  Clinical Narrative:     Update: Joelene Millin with [facility] reports patient uses [facility] for therapy needs and at time of discharge to fax [name] at [facility] the [facility] PT and OT orders and face to face, and she will send to their therapy manager Merry Proud.   CSW reached out to [facility] at [phone], spoke with [name] who reports they use [facility] for home health services. CSW has reached out to Glennis Brink with [facility] for [facility] PT and OT referral. Pending response at this time.   [name] with [facility] reports when patient is medically ready to discharge, we can fax the [facility] and discharge summary to [phone].     Expected Discharge Plan: Assisted Living Barriers to Discharge: Continued Medical Work up  Expected Discharge Plan and Services Expected Discharge Plan: Assisted Living   Discharge Planning Services: CM Consult   Living arrangements for the past 2 months: Assisted Living Facility                 DME Arranged: N/A DME Agency: NA       HH Arranged: NA           Social Determinants of Health (SDOH) Interventions    Readmission Risk Interventions No flowsheet data found.

## 2020-11-02 NOTE — Progress Notes (Signed)
Patient ambulates with a steady gait on room air. Patient maintains oxygen saturation greater than 94%. HR elevates into 130 s briefly while ambulating.

## 2020-11-02 NOTE — Plan of Care (Signed)

## 2020-11-02 NOTE — Progress Notes (Signed)
Occupational Therapy Treatment Patient Details Name: Shelly Ponce MRN: [id number] DOB: 1932-12-18 Today's Date: 11/02/2020    History of present illness Pt is an 87 y/o F admitted on 10/29/20 with c/c of SOB. Chest x-ray revealed moderate size B pleural effusions with bibasilar atelectasis. PMH: chronic a-fib on anticoagulation therapy, GERD, anemia, arthritis, basal cell carcinoma, scoliosis, squamous cell carcinoma   OT comments  Upon entering the room, pt supine in bed with family present. Pt is agreeable to OT intervention. Pt performing bed mobility without assistance and standing with RW with supervision. Pt donning B socks with set up A. Pt ambulating 150' with RW and close supervision with HR increasing to 130s'. Pt requesting to return to room. Pt seated in recliner chair with all needs within reach and family member present. Pt continues to benefit from OT intervention.    Follow Up Recommendations  Home health OT;Supervision - Intermittent    Equipment Recommendations  None recommended by OT       Precautions / Restrictions Precautions Precautions: Fall Precaution Comments: HR       Mobility Bed Mobility Overal bed mobility: Modified Independent Bed Mobility: Supine to Sit;Sit to Supine     Supine to sit: Modified independent (Device/Increase time) Sit to supine: Modified independent (Device/Increase time)   General bed mobility comments: no physical assist    Transfers Overall transfer level: Needs assistance Equipment used: Rolling walker (2 wheeled) Transfers: Sit to/from [facility] Sit to Stand: Supervision Stand pivot transfers: Supervision            Balance Overall balance assessment: Needs assistance Sitting-balance support: Feet supported Sitting balance-Leahy Scale: Good     Standing balance support: No upper extremity supported Standing balance-Leahy Scale: Fair Standing balance comment: static standing with supervision  without LOB noted                           ADL either performed or assessed with clinical judgement   ADL Overall ADL's : Needs assistance/impaired                     Lower Body Dressing: Supervision/safety;Sit to/from stand                       Vision Patient Visual Report: No change from baseline            Cognition Arousal/Alertness: Awake/alert Behavior During Therapy: WFL for tasks assessed/performed Overall Cognitive Status: Within Functional Limits for tasks assessed                                                     Pertinent Vitals/ Pain       Pain Assessment: No/denies pain   Frequency  Min 2X/week        Progress Toward Goals  OT Goals(current goals can now be found in the care plan section)  Progress towards OT goals: Progressing toward goals  Acute Rehab OT Goals Patient Stated Goal: to go home OT Goal Formulation: With patient Time For Goal Achievement: 11/13/20 Potential to Achieve Goals: Good  Plan Discharge plan remains appropriate;Frequency remains appropriate       AM-PAC OT "6 Clicks" Daily Activity     Outcome Measure   Help from another person eating meals?:  None Help from another person taking care of personal grooming?: None Help from another person toileting, which includes using toliet, bedpan, or urinal?: A Little Help from another person bathing (including washing, rinsing, drying)?: A Little Help from another person to put on and taking off regular upper body clothing?: None Help from another person to put on and taking off regular lower body clothing?: A Little 6 Click Score: 21    End of Session Equipment Utilized During Treatment: Rolling walker  OT Visit Diagnosis: Unsteadiness on feet (R26.81);Muscle weakness (generalized) (M62.81)   Activity Tolerance Patient tolerated treatment well   Patient Left in bed;with call bell/phone within reach;with bed alarm set    Nurse Communication Mobility status        Time: 1341-1355 OT Time Calculation (min): 14 min  Charges: OT General Charges $OT Visit: 1 Visit OT Treatments $Therapeutic Activity: 8-22 mins  Darleen Crocker, MS, OTR/L , CBIS ascom [phone]  11/02/20, 3:45 PM

## 2020-11-02 NOTE — Care Management Important Message (Signed)
Important Message  Patient Details  Name: Shelly Ponce MRN: [id number] Date of Birth: 1933/02/26   Medicare Important Message Given:  Yes     Dannette Barbara 11/02/2020, 12:08 PM

## 2020-11-03 LAB — CYTOLOGY - NON PAP

## 2020-11-03 LAB — PROTEIN, BODY FLUID (OTHER): Total Protein, Body Fluid Other: 1.8 g/dL

## 2020-11-06 ENCOUNTER — Ambulatory Visit: Payer: Medicare Other | Admitting: Family

## 2020-11-06 LAB — BODY FLUID CULTURE W GRAM STAIN: Culture: NO GROWTH

## 2020-12-10 ENCOUNTER — Other Ambulatory Visit: Payer: Self-pay | Admitting: Internal Medicine

## 2020-12-10 DIAGNOSIS — J90 Pleural effusion, not elsewhere classified: Secondary | ICD-10-CM

## 2020-12-11 ENCOUNTER — Ambulatory Visit
Admission: RE | Admit: 2020-12-11 | Discharge: 2020-12-11 | Disposition: A | Discharge status: Home / Self Care | Payer: Medicare Other | Source: Ambulatory Visit | Attending: Internal Medicine | Admitting: Internal Medicine

## 2020-12-11 ENCOUNTER — Ambulatory Visit
Admission: RE | Admit: 2020-12-11 | Discharge: 2020-12-11 | Disposition: A | Discharge status: Home / Self Care | Payer: Medicare Other | Source: Ambulatory Visit | Attending: Interventional Radiology | Admitting: Interventional Radiology

## 2020-12-11 ENCOUNTER — Other Ambulatory Visit: Payer: Self-pay

## 2020-12-11 ENCOUNTER — Other Ambulatory Visit: Payer: Self-pay | Admitting: Interventional Radiology

## 2020-12-11 DIAGNOSIS — J90 Pleural effusion, not elsewhere classified: Secondary | ICD-10-CM | POA: Diagnosis not present

## 2020-12-11 DIAGNOSIS — Z9889 Other specified postprocedural states: Secondary | ICD-10-CM

## 2020-12-11 NOTE — Procedures (Signed)
Pre procedural Dx: Symptomatic pleural effusion Post procedural Dx: Same  Successful US guided left sided thoracentesis yielding 500 cc of serous pleural fluid.   Samples sent to lab for analysis.  Evaluation of the right chest demonstrates a symmetrically sized small right sided pleural effuison.   EBL: None Complications: None immediate.  PLAN: - Pt will return early next week for repeat right sided thoracentesis.   Ronny Bacon, MD Pager #: [phone]

## 2020-12-14 ENCOUNTER — Other Ambulatory Visit: Payer: Self-pay | Admitting: Internal Medicine

## 2020-12-14 DIAGNOSIS — J90 Pleural effusion, not elsewhere classified: Secondary | ICD-10-CM

## 2020-12-15 ENCOUNTER — Other Ambulatory Visit: Payer: Self-pay

## 2020-12-15 ENCOUNTER — Ambulatory Visit
Admission: RE | Admit: 2020-12-15 | Discharge: 2020-12-15 | Disposition: A | Discharge status: Home / Self Care | Payer: Medicare Other | Source: Ambulatory Visit | Attending: Interventional Radiology | Admitting: Interventional Radiology

## 2020-12-15 ENCOUNTER — Ambulatory Visit
Admission: RE | Admit: 2020-12-15 | Discharge: 2020-12-15 | Disposition: A | Discharge status: Home / Self Care | Payer: Medicare Other | Source: Ambulatory Visit | Attending: Internal Medicine | Admitting: Internal Medicine

## 2020-12-15 ENCOUNTER — Other Ambulatory Visit: Payer: Self-pay | Admitting: Radiology

## 2020-12-15 DIAGNOSIS — J90 Pleural effusion, not elsewhere classified: Secondary | ICD-10-CM | POA: Insufficient documentation

## 2020-12-15 DIAGNOSIS — Z9889 Other specified postprocedural states: Secondary | ICD-10-CM

## 2020-12-15 NOTE — Procedures (Signed)
Interventional Radiology Procedure Note  Procedure: Image guided left thoracentesis.   Left was performed bc of the perceived size difference, with the left effusion larger than the right on [place] today. Discussed with the patient.   Complications: None  EBL: None  Recommendations: - pending CXR - DC when goals met - routine wound care  Signed,  Dulcy Fanny. Earleen Newport, DO

## 2021-01-18 ENCOUNTER — Other Ambulatory Visit: Payer: Self-pay | Admitting: Thoracic Surgery (Cardiothoracic Vascular Surgery)

## 2021-01-18 DIAGNOSIS — J90 Pleural effusion, not elsewhere classified: Secondary | ICD-10-CM

## 2021-01-20 ENCOUNTER — Encounter: Payer: Medicare Other | Admitting: Thoracic Surgery (Cardiothoracic Vascular Surgery)

## 2021-01-29 ENCOUNTER — Encounter: Payer: Medicare Other | Admitting: Thoracic Surgery (Cardiothoracic Vascular Surgery)

## 2021-02-08 ENCOUNTER — Encounter: Payer: Medicare Other | Admitting: Thoracic Surgery (Cardiothoracic Vascular Surgery)

## 2021-02-18 ENCOUNTER — Encounter: Payer: Self-pay | Admitting: Anesthesiology

## 2021-02-18 ENCOUNTER — Other Ambulatory Visit: Payer: Self-pay

## 2021-02-18 ENCOUNTER — Encounter: Payer: Self-pay | Admitting: Internal Medicine

## 2021-02-18 ENCOUNTER — Encounter
Admission: RE | Disposition: A | Discharge status: Home / Self Care | Payer: Self-pay | Source: Home / Self Care | Attending: Internal Medicine

## 2021-02-18 ENCOUNTER — Ambulatory Visit
Admission: RE | Admit: 2021-02-18 | Discharge: 2021-02-18 | Disposition: A | Discharge status: Home / Self Care | Payer: Medicare Other | Attending: Internal Medicine | Admitting: Internal Medicine

## 2021-02-18 DIAGNOSIS — Z539 Procedure and treatment not carried out, unspecified reason: Secondary | ICD-10-CM | POA: Insufficient documentation

## 2021-02-18 HISTORY — PX: CARDIOVERSION: SHX1299

## 2021-02-18 SURGERY — CARDIOVERSION
Anesthesia: General

## 2021-02-18 NOTE — Progress Notes (Signed)
Pt arrived to pre-procedure, ECG and MD confirmed patient was in sinus brady. Procedure cancelled.

## 2021-02-18 NOTE — Anesthesia Preprocedure Evaluation (Deleted)
Anesthesia Evaluation  Patient identified by MRN, date of birth, ID band Patient awake    Reviewed: Allergy & Precautions, H&P , NPO status , Patient's Chart, lab work & pertinent test results, reviewed documented beta blocker date and time   Airway Mallampati: II   Neck ROM: full    Dental  (+) Poor Dentition   Pulmonary neg pulmonary ROS,    Pulmonary exam normal        Cardiovascular Exercise Tolerance: Poor hypertension, On Medications +CHF  Normal cardiovascular exam+ dysrhythmias  Rhythm:regular Rate:Normal     Neuro/Psych negative neurological ROS  negative psych ROS   GI/Hepatic Neg liver ROS, GERD  Medicated,  Endo/Other  negative endocrine ROS  Renal/GU negative Renal ROS  negative genitourinary   Musculoskeletal   Abdominal   Peds  Hematology  (+) Blood dyscrasia, anemia ,   Anesthesia Other Findings Past Medical History: 05/26/2015: Absolute anemia 05/26/2015: Acid reflux No date: Actinic keratosis No date: Arthritis No date: Atrial fibrillation, chronic ([place]) 12/11/2008: Basal cell carcinoma     Comment:  right post neck 08/17/2009: Basal cell carcinoma     Comment:  left post lat base of neck 10/26/2012: Basal cell carcinoma     Comment:  left post base of neck. Excised 12/05/2012, margins free 09/27/2017: Basal cell carcinoma     Comment:  spinal mid back. EDC, EXC 10/06/20 02/11/2019: Basal cell carcinoma     Comment:  right mid to upper back paraspinal. Nodular. EDC 05/26/2015: BP (high blood pressure) No date: Bradycardia 05/26/2015: CA of skin 05/26/2015: Chronic constipation 06/02/2013: Cyst and pseudocyst of pancreas 05/26/2015: Edema leg     Comment:  Overview:  progressive  No date: Osteoarthritis No date: Risk for falls No date: Scoliosis 05/15/2015: Sepsis ([place]) 03/25/2013: Squamous cell carcinoma of skin     Comment:  right prox dorsum forearm (in situ) 10/25/2017: Squamous  cell carcinoma of skin     Comment:  left sup lat forehead.  07/30/2019: Squamous cell carcinoma of skin     Comment:  right index finger. WD SCC. EDc. 04/09/2020: Squamous cell carcinoma of skin     Comment:  right lower leg, anterior. WD SCC with superficial               infiltration. EDC. Past Surgical History: 05/16/2015: APPENDECTOMY     Comment:  Procedure: APPENDECTOMY;  Surgeon: Florene Glen, MD;              Location: [facility] ORS;  Service: General;; 11/23/2016: CATARACT EXTRACTION W/PHACO; Left     Comment:  Procedure: CATARACT EXTRACTION PHACO AND INTRAOCULAR               LENS PLACEMENT (IOC);  Surgeon: Estill Cotta, MD;                Location: [facility] ORS;  Service: Ophthalmology;  Laterality:              Left;  [place] 00:58.4 AP% 22.4 CDE 22.49 Fluid Pack lot #               [id number] H 2006: COLONOSCOPY 3/9/: HERNIA REPAIR; Right     Comment:  inguinal hernia 05/16/2015: LAPAROTOMY; N/A     Comment:  Procedure: EXPLORATORY LAPAROTOMY, DRAINAGE OF               INTRABDOMINAL ABSCESS;  Surgeon: Florene Glen, MD;                Location: [facility]  ORS;  Service: General;  Laterality: N/A; 1960: TONSILLECTOMY 1978: TUBAL LIGATION BMI    Body Mass Index: 18.52 kg/m     Reproductive/Obstetrics negative OB ROS                             Anesthesia Physical Anesthesia Plan  ASA: 3  Anesthesia Plan: General   Post-op Pain Management:    Induction:   PONV Risk Score and Plan:   Airway Management Planned:   Additional Equipment:   Intra-op Plan:   Post-operative Plan:   Informed Consent: I have reviewed the patients History and Physical, chart, labs and discussed the procedure including the risks, benefits and alternatives for the proposed anesthesia with the patient or authorized representative who has indicated his/her understanding and acceptance.     Dental Advisory Given  Plan Discussed with: CRNA  Anesthesia Plan  Comments:         Anesthesia Quick Evaluation

## 2021-04-15 ENCOUNTER — Other Ambulatory Visit: Payer: Self-pay

## 2021-04-15 ENCOUNTER — Ambulatory Visit (INDEPENDENT_AMBULATORY_CARE_PROVIDER_SITE_OTHER): Payer: Medicare Other | Admitting: Dermatology

## 2021-04-15 DIAGNOSIS — L578 Other skin changes due to chronic exposure to nonionizing radiation: Secondary | ICD-10-CM

## 2021-04-15 DIAGNOSIS — D485 Neoplasm of uncertain behavior of skin: Secondary | ICD-10-CM

## 2021-04-15 DIAGNOSIS — C44622 Squamous cell carcinoma of skin of right upper limb, including shoulder: Secondary | ICD-10-CM | POA: Diagnosis not present

## 2021-04-15 DIAGNOSIS — L820 Inflamed seborrheic keratosis: Secondary | ICD-10-CM | POA: Diagnosis not present

## 2021-04-15 DIAGNOSIS — D099 Carcinoma in situ, unspecified: Secondary | ICD-10-CM

## 2021-04-15 DIAGNOSIS — D0462 Carcinoma in situ of skin of left upper limb, including shoulder: Secondary | ICD-10-CM

## 2021-04-15 DIAGNOSIS — D0439 Carcinoma in situ of skin of other parts of face: Secondary | ICD-10-CM | POA: Diagnosis not present

## 2021-04-15 DIAGNOSIS — D0421 Carcinoma in situ of skin of right ear and external auricular canal: Secondary | ICD-10-CM

## 2021-04-15 DIAGNOSIS — L570 Actinic keratosis: Secondary | ICD-10-CM

## 2021-04-15 MED ORDER — FLUOROURACIL 5 % EX CREA: TOPICAL_CREAM | Freq: Two times a day (BID) | CUTANEOUS | 1 refills | Status: AC

## 2021-04-15 NOTE — Patient Instructions (Signed)
Wound Care Instructions  Cleanse wound gently with soap and water once a day then pat dry with clean gauze. Apply a thing coat of Petrolatum (petroleum jelly, "Vaseline") over the wound (unless you have an allergy to this). We recommend that you use a new, sterile tube of Vaseline. Do not pick or remove scabs. Do not remove the yellow or white "healing tissue" from the base of the wound.  Cover the wound with fresh, clean, nonstick gauze and secure with paper tape. You may use Band-Aids in place of gauze and tape if the would is small enough, but would recommend trimming much of the tape off as there is often too much. Sometimes Band-Aids can irritate the skin.  You should call the office for your biopsy report after 1 week if you have not already been contacted.  If you experience any problems, such as abnormal amounts of bleeding, swelling, significant bruising, significant pain, or evidence of infection, please call the office immediately.  FOR ADULT SURGERY PATIENTS: If you need something for pain relief you may take 1 extra strength Tylenol (acetaminophen) AND 2 Ibuprofen (200mg each) together every 4 hours as needed for pain. (do not take these if you are allergic to them or if you have a reason you should not take them.) Typically, you may only need pain medication for 1 to 3 days.    Cryotherapy Aftercare  Wash gently with soap and water everyday.   Apply Vaseline and Band-Aid daily until healed.    If you have any questions or concerns for your doctor, please call our main line at [phone] and press option 4 to reach your doctor's medical assistant. If no one answers, please leave a voicemail as directed and we will return your call as soon as possible. Messages left after 4 pm will be answered the following business day.   You may also send us a message via MyChart. We typically respond to MyChart messages within 1-2 business days.  For prescription refills, please ask your  pharmacy to contact our office. Our fax number is [phone].  If you have an urgent issue when the clinic is closed that cannot wait until the next business day, you can page your doctor at the number below.    Please note that while we do our best to be available for urgent issues outside of office hours, we are not available 24/7.   If you have an urgent issue and are unable to reach us, you may choose to seek medical care at your doctor's office, retail clinic, urgent care center, or emergency room.  If you have a medical emergency, please immediately call 911 or go to the emergency department.  Pager Numbers  - Dr. Kowalski: [phone]  - Dr. Moye: [phone]  - Dr. Stewart: [phone]  In the event of inclement weather, please call our main line at [phone] for an update on the status of any delays or closures.  Dermatology Medication Tips: Please keep the boxes that topical medications come in in order to help keep track of the instructions about where and how to use these. Pharmacies typically print the medication instructions only on the boxes and not directly on the medication tubes.   If your medication is too expensive, please contact our office at [phone] option 4 or send us a message through MyChart.   We are unable to tell what your co-pay for medications will be in advance as this is different depending on your insurance coverage. However,   we may be able to find a substitute medication at lower cost or fill out paperwork to get insurance to cover a needed medication.   If a prior authorization is required to get your medication covered by your insurance company, please allow us 1-2 business days to complete this process.  Drug prices often vary depending on where the prescription is filled and some pharmacies may offer cheaper prices.  The website www.goodrx.com contains coupons for medications through different pharmacies. The prices here do not  account for what the cost may be with help from insurance (it may be cheaper with your insurance), but the website can give you the price if you did not use any insurance.  - You can print the associated coupon and take it with your prescription to the pharmacy.  - You may also stop by our office during regular business hours and pick up a GoodRx coupon card.  - If you need your prescription sent electronically to a different pharmacy, notify our office through [place] MyChart or by phone at [phone] option 4.  

## 2021-04-15 NOTE — Progress Notes (Signed)
Follow-Up Visit   Subjective  Shelly Ponce is a 87 y.o. female who presents for the following: Other (Spot of right arm that is getting larger and spots of face and right ear that are scaly).  The patient points out several areas of the face neck back chest arms and hands she would like checked today.  Accompanied by friend, Humberto Leep  The following portions of the chart were reviewed this encounter and updated as appropriate:   Tobacco  Allergies  Meds  Problems  Med Hx  Surg Hx  Fam Hx     Review of Systems:  No other skin or systemic complaints except as noted in HPI or Assessment and Plan.  Objective  Well appearing patient in no apparent distress; mood and affect are within normal limits.  A focused examination was performed including face, arms. Relevant physical exam findings are noted in the Assessment and Plan.  Right Forearm - Posterior 1.5 cm hyperkeratotic papule  Right Temple Erythematous keratotic or waxy stuck-on papule or plaque.   Left Ear Erythematous thin papules/macules with gritty scale.   Left Hand / Wrist 1.2 cm pink papule   Left Forearm 2.1 cm pink papule  Right Temple Pink patch  Right Ear Pink patch   Assessment & Plan   Actinic Damage - severe - chronic, secondary to cumulative UV radiation exposure/sun exposure over time - diffuse scaly erythematous macules with underlying dyspigmentation - Recommend daily broad spectrum sunscreen SPF 30+ to sun-exposed areas, reapply every 2 hours as needed.  - Recommend staying in the shade or wearing long sleeves, sun glasses (UVA+UVB protection) and wide brim hats (4-inch brim around the entire circumference of the hat). - Call for new or changing lesions.  Neoplasm of uncertain behavior of skin Right Forearm - Posterior  Epidermal / dermal shaving  Lesion diameter (cm):  1.5 Informed consent: discussed and consent obtained   Timeout: patient name, date of birth, surgical site, and  procedure verified   Procedure prep:  Patient was prepped and draped in usual sterile fashion Prep type:  Isopropyl alcohol Anesthesia: the lesion was anesthetized in a standard fashion   Anesthetic:  1% lidocaine w/ epinephrine 1-100,000 buffered w/ 8.4% NaHCO3 Instrument used: flexible razor blade   Hemostasis achieved with: pressure, aluminum chloride and electrodesiccation   Outcome: patient tolerated procedure well   Post-procedure details: sterile dressing applied and wound care instructions given   Dressing type: bandage and petrolatum    Destruction of lesion Complexity: extensive   Destruction method: electrodesiccation and curettage   Informed consent: discussed and consent obtained   Timeout:  patient name, date of birth, surgical site, and procedure verified Procedure prep:  Patient was prepped and draped in usual sterile fashion Prep type:  Isopropyl alcohol Anesthesia: the lesion was anesthetized in a standard fashion   Anesthetic:  1% lidocaine w/ epinephrine 1-100,000 buffered w/ 8.4% NaHCO3 Curettage performed in three different directions: Yes   Electrodesiccation performed over the curetted area: Yes   Lesion length (cm):  1.5 Lesion width (cm):  1.5 Margin per side (cm):  0.2 Final wound size (cm):  1.9 Hemostasis achieved with:  pressure and aluminum chloride Outcome: patient tolerated procedure well with no complications   Post-procedure details: sterile dressing applied and wound care instructions given   Dressing type: bandage and petrolatum    Specimen 1 - Surgical pathology Differential Diagnosis: SCC vs other  Check Margins: No EDC today  Inflamed seborrheic keratosis Right Temple and  cheek  Destruction of lesion - Right Temple Complexity: simple   Destruction method: cryotherapy   Informed consent: discussed and consent obtained   Timeout:  patient name, date of birth, surgical site, and procedure verified Lesion destroyed using liquid nitrogen:  Yes   Region frozen until ice ball extended beyond lesion: Yes   Outcome: patient tolerated procedure well with no complications   Post-procedure details: wound care instructions given    AK (actinic keratosis) Left Ear  Destruction of lesion - Left Ear Complexity: simple   Destruction method: cryotherapy   Informed consent: discussed and consent obtained   Timeout:  patient name, date of birth, surgical site, and procedure verified Lesion destroyed using liquid nitrogen: Yes   Region frozen until ice ball extended beyond lesion: Yes   Outcome: patient tolerated procedure well with no complications   Post-procedure details: wound care instructions given    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  Patient is very fragile and has multiple skin cancers and it is not practical to biopsy all her lesions; rather it is more practical to treat them appropriately for the clinical diagnosis   Left hand / wrist SCCis 1.2 cm Destruction of lesion Complexity: simple   Destruction method: cryotherapy   Informed consent: discussed and consent obtained   Timeout:  patient name, date of birth, surgical site, and procedure verified Lesion destroyed using liquid nitrogen: Yes   Region frozen until ice ball extended beyond lesion: Yes   Outcome: patient tolerated procedure well with no complications   Post-procedure details: wound care instructions given    Left Forearm SCCis 2.1 cm Destruction of lesion Complexity: simple   Destruction method: cryotherapy   Informed consent: discussed and consent obtained   Timeout:  patient name, date of birth, surgical site, and procedure verified Lesion destroyed using liquid nitrogen: Yes   Region frozen until ice ball extended beyond lesion: Yes   Outcome: patient tolerated procedure well with no complications   Post-procedure details: wound care instructions given    Right Temple SCCis 3 cm fluorouracil (EFUDEX) 5 % cream Apply  topically 2 (two) times daily. For 2 weeks  Right Ear SCCis 1.2 cm Start Fluorouracil 5% cream mixed Calcipotriene bid x 2 weeks ti right temple and right ear - Rx sent to [facility] and will be mailed to [facility].  Return in about 4 months (around 08/16/2021).  I, Ashok Cordia, CMA, am acting as scribe for Sarina Ser, MD . Documentation: I have reviewed the above documentation for accuracy and completeness, and I agree with the above.  Sarina Ser, MD

## 2021-04-16 ENCOUNTER — Encounter: Payer: Self-pay | Admitting: Dermatology

## 2021-04-20 ENCOUNTER — Telehealth: Payer: Self-pay

## 2021-04-20 NOTE — Telephone Encounter (Signed)
Discussed biopsy results with pt  °

## 2021-04-20 NOTE — Telephone Encounter (Signed)
-----   Message from Ralene Bathe, MD sent at 04/17/2021  1:14 PM EDT ----- Diagnosis Skin , right forearm-posterior SQUAMOUS CELL CARCINOMA, KERATOACANTHOMA TYPE  Cancer - SCC Already treated Recheck next visit

## 2021-06-18 ENCOUNTER — Emergency Department
Admission: EM | Admit: 2021-06-18 | Discharge: 2021-06-18 | Disposition: A | Discharge status: Home / Self Care | Payer: Medicare Other | Attending: Emergency Medicine | Admitting: Emergency Medicine

## 2021-06-18 DIAGNOSIS — E039 Hypothyroidism, unspecified: Secondary | ICD-10-CM | POA: Insufficient documentation

## 2021-06-18 DIAGNOSIS — Z20822 Contact with and (suspected) exposure to covid-19: Secondary | ICD-10-CM | POA: Insufficient documentation

## 2021-06-18 DIAGNOSIS — Z7951 Long term (current) use of inhaled steroids: Secondary | ICD-10-CM | POA: Insufficient documentation

## 2021-06-18 DIAGNOSIS — I482 Chronic atrial fibrillation, unspecified: Secondary | ICD-10-CM

## 2021-06-18 DIAGNOSIS — R001 Bradycardia, unspecified: Secondary | ICD-10-CM | POA: Diagnosis present

## 2021-06-18 DIAGNOSIS — Z85828 Personal history of other malignant neoplasm of skin: Secondary | ICD-10-CM | POA: Insufficient documentation

## 2021-06-18 DIAGNOSIS — Z79899 Other long term (current) drug therapy: Secondary | ICD-10-CM | POA: Insufficient documentation

## 2021-06-18 DIAGNOSIS — I5043 Acute on chronic combined systolic (congestive) and diastolic (congestive) heart failure: Secondary | ICD-10-CM | POA: Diagnosis not present

## 2021-06-18 LAB — RESP PANEL BY RT-PCR (FLU A&B, COVID) ARPGX2
Influenza A by PCR: NEGATIVE
Influenza B by PCR: NEGATIVE
SARS Coronavirus 2 by RT PCR: NEGATIVE

## 2021-06-18 LAB — BASIC METABOLIC PANEL
Anion gap: 8 (ref 5–15)
BUN: 20 mg/dL (ref 8–23)
CO2: 26 mmol/L (ref 22–32)
Calcium: 8.7 mg/dL — ABNORMAL LOW (ref 8.9–10.3)
Chloride: 105 mmol/L (ref 98–111)
Creatinine, Ser: 0.93 mg/dL (ref 0.44–1.00)
GFR, Estimated: 60 mL/min — ABNORMAL LOW (ref >60)
Glucose, Bld: 112 mg/dL — ABNORMAL HIGH (ref 70–99)
Potassium: 4.5 mmol/L (ref 3.5–5.1)
Sodium: 139 mmol/L (ref 135–145)

## 2021-06-18 LAB — CBC WITH DIFFERENTIAL/PLATELET
Abs Immature Granulocytes: 0.06 10*3/uL (ref 0.00–0.07)
Basophils Absolute: 0.1 10*3/uL (ref 0.0–0.1)
Basophils Relative: 1 %
Eosinophils Absolute: 0.2 10*3/uL (ref 0.0–0.5)
Eosinophils Relative: 2 %
HCT: 38.4 % (ref 36.0–46.0)
Hemoglobin: 12.7 g/dL (ref 12.0–15.0)
Immature Granulocytes: 1 %
Lymphocytes Relative: 16 %
Lymphs Abs: 1.3 10*3/uL (ref 0.7–4.0)
MCH: 34.4 pg — ABNORMAL HIGH (ref 26.0–34.0)
MCHC: 33.1 g/dL (ref 30.0–36.0)
MCV: 104.1 fL — ABNORMAL HIGH (ref 80.0–100.0)
Monocytes Absolute: 0.6 10*3/uL (ref 0.1–1.0)
Monocytes Relative: 7 %
Neutro Abs: 5.8 10*3/uL (ref 1.7–7.7)
Neutrophils Relative %: 73 %
Platelets: 204 10*3/uL (ref 150–400)
RBC: 3.69 MIL/uL — ABNORMAL LOW (ref 3.87–5.11)
RDW: 14.1 % (ref 11.5–15.5)
WBC: 7.9 10*3/uL (ref 4.0–10.5)
nRBC: 0 % (ref 0.0–0.2)

## 2021-06-18 LAB — MAGNESIUM: Magnesium: 2.2 mg/dL (ref 1.7–2.4)

## 2021-06-18 LAB — TSH: TSH: 105 u[IU]/mL — ABNORMAL HIGH (ref 0.350–4.500)

## 2021-06-18 LAB — T4, FREE: Free T4: 0.36 ng/dL — ABNORMAL LOW (ref 0.61–1.12)

## 2021-06-18 MED ORDER — LEVOTHYROXINE SODIUM 50 MCG PO TABS: 50.0000 ug | ORAL_TABLET | Freq: Every day | ORAL | 11 refills | Status: AC

## 2021-06-18 MED ORDER — LEVOTHYROXINE SODIUM 50 MCG PO TABS
50.0000 ug | ORAL_TABLET | ORAL | Status: AC
  Administered 2021-06-18: 16:00:00 50 ug via ORAL
  Filled 2021-06-18: qty 1

## 2021-06-18 MED ORDER — AMIODARONE HCL 200 MG PO TABS: 100.0000 mg | ORAL_TABLET | Freq: Two times a day (BID) | ORAL | 0 refills | Status: AC

## 2021-06-18 NOTE — ED Notes (Signed)
While up and moving around pt oxygen saturations remained 97% and above. Pt denies dizziness.

## 2021-06-18 NOTE — ED Notes (Signed)
Pt ambulatory without difficulty with walker.

## 2021-06-18 NOTE — ED Notes (Signed)
[place] of lab work sent to lab.

## 2021-06-18 NOTE — Significant Event (Signed)
Cardiology evaluation note  I was called regarding Shelly Ponce and her bradycardia.  She has a history of atrial fibrillation, hypertension, hyperlipidemia who presented yesterday to her cardiologist (Dr. Nehemiah Massed) with complaints of fatigue.  In clinic her heart rate was noted to be 28.  Her beta-blocker and calcium channel blocker was discontinued.  She was continued on amiodarone 100 mg daily.  She was sent by her facility for an asymptomatic heart rate in the 30s.  She states that she did not have any dizziness lightheadedness, syncope, presyncope.  She overall feels fine and wants to go home.  Her labs are notable for a TSH greater than 100.  From a cardiac perspective she has no need for a pacemaker at this time, and rather needs treatment of her hypothyroidism.  Defer to the emergency department and internal medicine providers regarding treatment of this.  Andrez Grime, MD

## 2021-06-18 NOTE — ED Triage Notes (Signed)
Per EMS, Pt, from [facility], presents w/ bradycardia (30s-40s).  Hx of same.  Pt was seen by cardiology yesterday and amiodarone, diltiazem, and metoprolol were discontinued.  Pt has no complaints.

## 2021-06-18 NOTE — ED Provider Notes (Addendum)
[facility] Emergency Department Provider Note  ____________________________________________   Event Date/Time   First MD Initiated Contact with Patient 06/18/21 [id number]     (approximate)  I have reviewed the triage vital signs and the nursing notes.   HISTORY  Chief Complaint Bradycardia   HPI Shelly Ponce is a 87 y.o. female with below noted past medical history including paroxysmal A. fib previously managed with amiodarone, diltiazem and metoprolol with anticoagulation with Eliquis having been seen by cardiology yesterday for bradycardia and having her diltiazem metoprolol discontinued due to bradycardia who presents via EMS from nursing facility with concerns for bradycardia.  On arrival to emergency room today patient denies any symptoms including chest pain, cough, shortness of breath, lightheadedness, dizziness, vomiting, diarrhea, rash, fevers or any other clear acute complaints.  She endorses some fatigue and on review of yesterday's cardiology visit seems was complaining of fatigue yesterday as well.  Seems she also complained of shortness of breath yesterday but denies this today to this examiner.         Past Medical History:  Diagnosis Date   Absolute anemia 05/26/2015   Acid reflux 05/26/2015   Actinic keratosis    Arthritis    Atrial fibrillation, chronic (HCC)    Basal cell carcinoma 12/11/2008   right post neck   Basal cell carcinoma 08/17/2009   left post lat base of neck   Basal cell carcinoma 10/26/2012   left post base of neck. Excised 12/05/2012, margins free   Basal cell carcinoma 09/27/2017   spinal mid back. EDC, EXC 10/06/20   Basal cell carcinoma 02/11/2019   right mid to upper back paraspinal. Nodular. EDC   BP (high blood pressure) 05/26/2015   Bradycardia    CA of skin 05/26/2015   Chronic constipation 05/26/2015   Cyst and pseudocyst of pancreas 06/02/2013   Edema leg 05/26/2015   Overview:  progressive     Osteoarthritis    Risk for falls    Scoliosis    Sepsis ([place]) 05/15/2015   Squamous cell carcinoma of skin 03/25/2013   right prox dorsum forearm (in situ)   Squamous cell carcinoma of skin 10/25/2017   left sup lat forehead.    Squamous cell carcinoma of skin 07/30/2019   right index finger. WD SCC. EDc.   Squamous cell carcinoma of skin 04/09/2020   right lower leg, anterior. WD SCC with superficial infiltration. EDC.   Squamous cell carcinoma of skin 04/15/2021   right forearm, EDC    Patient Active Problem List   Diagnosis Date Noted   Atrial fibrillation with RVR ([place])    Acute on chronic combined systolic and diastolic CHF (congestive heart failure) (HCC)    Bilateral pleural effusion    Weakness    Acute diastolic CHF (congestive heart failure) ([place]) 10/29/2020   Acute respiratory failure ([place])    Protein-calorie malnutrition, severe 06/27/2017   Atrial fibrillation, chronic ([place]) 06/26/2017   Absolute anemia 05/26/2015   Chronic constipation 05/26/2015   Acid reflux 05/26/2015   BP (high blood pressure) 05/26/2015   Edema leg 05/26/2015   OP (osteoporosis) 05/26/2015   Dupuytren's contracture of foot 05/26/2015   CA of skin 05/26/2015   Abdominal pain    Sepsis ([place]) 05/15/2015   UTI (lower urinary tract infection) 05/15/2015   Bursitis, trochanteric 01/22/2015   Degeneration of intervertebral disc of lumbar region 01/23/2014   Neuritis or radiculitis due to rupture of lumbar intervertebral disc 01/22/2014   Cyst and pseudocyst  of pancreas 06/02/2013    Past Surgical History:  Procedure Laterality Date   APPENDECTOMY  05/16/2015   Procedure: APPENDECTOMY;  Surgeon: Florene Glen, MD;  Location: [facility];  Service: General;;   CARDIOVERSION N/A 02/18/2021   Procedure: CARDIOVERSION;  Surgeon: Corey Skains, MD;  Location: [facility];  Service: Cardiovascular;  Laterality: N/A;   CATARACT EXTRACTION W/PHACO Left 11/23/2016   Procedure: CATARACT EXTRACTION  PHACO AND INTRAOCULAR LENS PLACEMENT ([place]);  Surgeon: Estill Cotta, MD;  Location: [facility];  Service: Ophthalmology;  Laterality: Left;  [place] 00:58.4 AP% 22.4 CDE 22.49 Fluid Pack lot # [id number]   COLONOSCOPY  2006   HERNIA REPAIR Right 3/9/   inguinal hernia   LAPAROTOMY N/A 05/16/2015   Procedure: EXPLORATORY LAPAROTOMY, DRAINAGE OF INTRABDOMINAL ABSCESS;  Surgeon: Florene Glen, MD;  Location: [facility];  Service: General;  Laterality: N/A;   [place]    Prior to Admission medications   Medication Sig Start Date End Date Taking? Authorizing Provider  levothyroxine (SYNTHROID) 50 MCG tablet Take 1 tablet (50 mcg total) by mouth daily. 06/18/21 06/18/22 Yes Lucrezia Starch, MD  acetaminophen (TYLENOL) 500 MG tablet Take 500 mg by mouth every 8 (eight) hours as needed for mild pain.    [provider]  amiodarone (PACERONE) 200 MG tablet Take 0.5 tablets (100 mg total) by mouth 2 (two) times daily. 06/18/21   Lucrezia Starch, MD  apixaban (ELIQUIS) 2.5 MG TABS tablet Take 1 tablet (2.5 mg total) by mouth 2 (two) times daily. 06/27/17   [name], MD  carboxymethylcellulose (REFRESH PLUS) 0.5 % SOLN Place 1-2 drops into both eyes 3 (three) times daily as needed (dry eyes).    [provider]  cholecalciferol (VITAMIN D3) 25 MCG (1000 UNIT) tablet Take 1,000 Units by mouth daily.    [provider]  diclofenac Sodium (VOLTAREN) 1 % GEL Apply 2 g topically 3 (three) times daily as needed (left hip pain).    [provider]  estradiol (ESTRACE) 0.5 MG tablet Take 0.5 mg by mouth daily.    [provider]  fluorouracil (EFUDEX) 5 % cream Apply topically 2 (two) times daily. For 2 weeks 04/15/21   Ralene Bathe, MD  fluticasone [facility]) 50 MCG/ACT nasal spray Place 2 sprays into both nostrils daily. 11/03/20   Loletha Grayer, MD  furosemide (LASIX) 20 MG tablet Take 1 tablet (20 mg total) by  mouth daily. Patient taking differently: Take 10 mg by mouth daily. 11/03/20   Loletha Grayer, MD  HYDROcodone-acetaminophen (NORCO) 10-325 MG tablet Take 1 tablet by mouth every 4 (four) hours as needed.    [provider]  hydrocortisone cream 1 % Apply 1 application topically daily as needed for itching.    [provider]  Multiple Vitamins-Minerals (HCA SUPER THERAVITE-M PO) Take 1 tablet by mouth daily.    [provider]  Multiple Vitamins-Minerals (HEALTHY EYES SUPERVISION 2) CAPS Take 1 capsule by mouth 2 (two) times daily.    [provider]  ondansetron (ZOFRAN) 4 MG tablet Take 4 mg by mouth every 6 (six) hours as needed for nausea or vomiting.    [provider]  pantoprazole (PROTONIX) 40 MG tablet Take 40 mg by mouth daily.    [provider]  potassium chloride SA (KLOR-CON) 10 MEQ tablet Take 1 tablet (10 mEq total) by mouth daily. 11/03/20   Loletha Grayer, MD  progesterone (PROMETRIUM) 100  MG capsule Take 100 mg by mouth daily. Take 100 mg (1 capsule) by mouth daily for 15 days. Do not take for 15 more days. Then start taking again for 15 days.    [provider]  senna-docusate (SENOKOT-S) 8.6-50 MG tablet Take 1 tablet by mouth daily.    [provider]  traZODone (DESYREL) 50 MG tablet Take 50 mg by mouth at bedtime.    [provider]  triamcinolone cream (KENALOG) 0.1 % Apply 1 application topically 2 (two) times daily as needed (rash).    [provider]    Allergies Macrobid [nitrofurantoin] and Sulfa antibiotics  Family History  Problem Relation Age of Onset   Dementia Mother    Leukemia Father     Social History Social History   Tobacco Use   Smoking status: Never   Smokeless tobacco: Never  Substance Use Topics   Alcohol use: No   Drug use: No    Review of Systems  Review of Systems  Constitutional:  Positive for malaise/fatigue. Negative for chills and fever.   HENT:  Negative for sore throat.   Eyes:  Negative for pain.  Respiratory:  Negative for cough and stridor.   Cardiovascular:  Negative for chest pain.  Gastrointestinal:  Negative for vomiting.  Skin:  Negative for rash.  Neurological:  Negative for seizures, loss of consciousness and headaches.  Psychiatric/Behavioral:  Negative for suicidal ideas.   All other systems reviewed and are negative.    ____________________________________________   PHYSICAL EXAM:  VITAL SIGNS: ED Triage Vitals  Enc Vitals Group     BP --      Pulse --      Resp --      Temp --      Temp src --      SpO2 06/18/21 0935 99 %     Weight 06/18/21 0941 98 lb (44.5 kg)     Height 06/18/21 0941 5\' 2"  (1.575 m)     Head Circumference --      Peak Flow --      Pain Score 06/18/21 0941 0     Pain Loc --      Pain Edu? --      Excl. in [place]? --    Vitals:   06/18/21 0955 06/18/21 1118  BP: (!) 147/55 (!) 140/53  Pulse: (!) 37 (!) 35  Resp: 14 16  Temp: 98.5 F (36.9 C)   SpO2: 100% 98%   Physical Exam Vitals and nursing note reviewed.  Constitutional:      General: She is not in acute distress.    Appearance: She is well-developed.  HENT:     Head: Normocephalic and atraumatic.     Right Ear: External ear normal.     Left Ear: External ear normal.     Nose: Nose normal.  Eyes:     Conjunctiva/sclera: Conjunctivae normal.  Cardiovascular:     Rate and Rhythm: Bradycardia present. Rhythm irregular.     Heart sounds: No murmur heard. Pulmonary:     Effort: Pulmonary effort is normal. No respiratory distress.     Breath sounds: Normal breath sounds.  Abdominal:     Palpations: Abdomen is soft.     Tenderness: There is no abdominal tenderness.  Musculoskeletal:        General: No swelling.     Cervical back: Neck supple.  Skin:    General: Skin is warm and dry.     Capillary Refill: Capillary  refill takes less than 2 seconds.  Neurological:     Mental Status: She is alert.   Psychiatric:        Mood and Affect: Mood normal.     ____________________________________________   LABS (all labs ordered are listed, but only abnormal results are displayed)  Labs Reviewed  CBC WITH DIFFERENTIAL/PLATELET - Abnormal; Notable for the following components:      Result Value   RBC 3.69 (*)    MCV 104.1 (*)    MCH 34.4 (*)    All other components within normal limits  BASIC METABOLIC PANEL - Abnormal; Notable for the following components:   Glucose, Bld 112 (*)    Calcium 8.7 (*)    GFR, Estimated 60 (*)    All other components within normal limits  TSH - Abnormal; Notable for the following components:   TSH 105.000 (*)    All other components within normal limits  T4, FREE - Abnormal; Notable for the following components:   Free T4 0.36 (*)    All other components within normal limits  RESP PANEL BY RT-PCR (FLU A&B, COVID) ARPGX2  MAGNESIUM  T3, FREE   ____________________________________________  EKG  ECG remarkable for A. fib with a ventricular rate of 40 and Q wave in V2 without other clear evidence of acute ischemia or significant arrhythmia. ____________________________________________  RADIOLOGY  ED MD interpretation:    Official radiology report(s): No results found.  ____________________________________________   PROCEDURES  Procedure(s) performed (including Critical Care):  .1-3 Lead EKG Interpretation Performed by: Lucrezia Starch, MD Authorized by: Lucrezia Starch, MD     Interpretation: abnormal     ECG rate assessment: bradycardic     Rhythm: atrial fibrillation     Ectopy: none     ____________________________________________   INITIAL IMPRESSION / ASSESSMENT AND PLAN / ED COURSE      Patient presents with above to history exam for evaluation from nursing home with concerns for bradycardia.  Patient is denying any associated symptoms with this examiner.  It seems she was seen for this by her cardiologist yesterday  and had her diltiazem and metoprolol held and her amiodarone dose decreased.  Review of those records it seems she had some shortness of breath then but she is denying this today.  She does endorsing some subacute fatigue over last couple weeks.  On arrival today she is bradycardic with heart rate at 37 on arrival with otherwise stable vital signs.  She does not appear septic or toxic and awake alert and talking.  Although certainly possible recent discontinuation of metoprolol and diltiazem suspect this likely would have washed out by now.  Could certainly also be side effects of amiodarone.  Patient does not appear to be in myxedema coma and is not bradycardic.  She does not seem to be on any other beta-blockers or calcium channel blockers.  Magnesium and BMP shows no significant electrolyte or metabolic derangements.  CBC shows no leukocytosis or acute anemia.  ECG shows A. fib with a slow rate of 40 with no clear evidence of ischemia.  TSH is very elevated at 105.  Free T4 is low.  This concerning for hypothyroidism as possibly primary versus significant contributing factor to patient's bradycardia.  On review of records it seems patient was discharged from clinic yesterday with a heart rate of 28.  I discussed this with on-call cardiologist Dr. Corky Sox Who recommended checking orthostatics and ambulating patient and if she remained asymptomatic can likely be  discharged with continued outpatient follow-up.  Patient does not seem orthostatic and is able to ambulate without difficulty.  Discussed with hospitalist as a consult as I am unable to obtain any endocrine consultation as there is no endocrine on-call at [facility] in [place] or Dr. Veda Canning clinic.  In addition [facility] and [facility] are not taking any consults.  After discussion with patient and evaluation Dr.Danford who Recommend initiating 50 mcg of Synthroid having patient follow-up with PCP.  Will write this prescription.  Discharged in stable condition.   Strict and precautions provided in writing.        ____________________________________________   FINAL CLINICAL IMPRESSION(S) / ED DIAGNOSES  Final diagnoses:  Bradycardia  Hypothyroidism, unspecified type    Medications  levothyroxine (SYNTHROID) tablet 50 mcg (has no administration in time range)     ED Discharge Orders          Ordered    amiodarone (PACERONE) 200 MG tablet  2 times daily        06/18/21 1014    levothyroxine (SYNTHROID) 50 MCG tablet  Daily        06/18/21 1553    Discharge instructions       Comments: From Dr. Loleta Books: You were evaluated for slow heart rate. Here, we found that this was being exacerbated by low thyroid hormone levels.  You should start the thyroid hormone levothyroxine/Synthroid  Take levothyroxine 50 mcg daily Take in the morning on an empty stomach, at least 30-60 minutes before breakfast If you take any supplements with calcium in them, stop them for now an discuss with Dr.Klein   Go see [name] next week Call him Tuesday to set up an appointment  Keep your upcoming appointment with Dr. Nehemiah Massed For now, do NOT take the diltiazem or metoprolol like he told you to stop, AND reduce the dose of amiodarone to 100 mg (like he told you)  Return to the hospital for dizziness, chest pain, feeling like you might pass our, confusion, or low temperature   06/18/21 1602             Note:  This document was prepared using Dragon voice recognition software and may include unintentional dictation errors.    Lucrezia Starch, MD 06/18/21 1613    Lucrezia Starch, MD 06/18/21 [phone]

## 2021-06-18 NOTE — Consult Note (Addendum)
Initial Consultation Note   Patient: Shelly Ponce ZOX:[id number] DOB: March 11, 1933 PCP: Adin Hector, MD DOA: 06/18/2021 DOS: the patient was seen and examined on 06/18/2021   Referring physician: Dr. Tamala Julian Reason for consult: Hypothyroidism  Assessment/Plan Hypothyroidism TSH elevated, free T4 low.  Last TSH was last July, normal at 2.4.  Chronicity of symptoms appears to be >6 months.  No hypothermia, mental status changes, electrolyte derangement.  Patient feels at her (fatigued) recent baseline.  Given her history of Afib, her advanced age and the chronic duration of symptoms, I recommend slightly less than weight-based dosing.  She does not have any acute symptoms or hypothermia, hypotension, or mental status changes that would warrant IV treatment.  She is at risk for tachycardia over the next few weeks with starting thyroid supplement, but I do not think there is particular benefit to initiation of therapy in the hospital setting.    - Start levothyroxine 50 mcg daily - Follow up with PCP next week    Bradycardia  Secondary to #1 - Hold diltiazem and metoprolol - Continue reduced dose amiodarone - Treat thyroid - Follow up with Cardiology as previously planned        HPI: Shelly Ponce is a 87 y.o. female with past medical history of chronic atrial fibrillation on amiodarone and Eliquis, chronic systolic and diastolic CHF EF 81-19% last summer, and underweight, who presented with bradycardia.  Patient has had progressive exertional intolerance, fatigue, and cardiac arrhythmias for about 9 months.  Was admitted for same last summer, managed as a CHF exacerbation, had adjustment of her cardiac medications, and seemed to be stabilized from cardiac standpoint since then.  However, she has continued to get weaker, and had progressive fatigue with exertion, weight loss.    This week, she was bradycardic at her Cardiologist's office, had her diltiazem and metoprolol  stopped. Then today, the nurse at [facility] checked her pulse and it was stil in the 30s and 40s, so sent her to the ER.  In the ER, she was tired, but no different from her recent baseline.  Denied chest pain, dyspnea, dizziness, presyncope, recent syncope.  Stated she felt at baseline.  No confusion, hypothermia. Denied constipation, changes to hair or skin.    TSH checked and was 100, free T4 was low at 0.3.    Cardiology were consulted, who recommended continuing to hold her diltiazem and BB, and recommended no pacer.      Review of Systems: Review of Systems  Constitutional:  Positive for malaise/fatigue and weight loss.  Cardiovascular:  Negative for chest pain, palpitations and leg swelling.  Gastrointestinal:  Negative for constipation.  Neurological:  Negative for dizziness, sensory change and loss of consciousness.  Psychiatric/Behavioral:  Negative for depression and memory loss. The patient does not have insomnia.   All other systems reviewed and are negative.   Past Medical History:  Diagnosis Date   Absolute anemia 05/26/2015   Acid reflux 05/26/2015   Actinic keratosis    Arthritis    Atrial fibrillation, chronic (HCC)    Basal cell carcinoma 12/11/2008   right post neck   Basal cell carcinoma 08/17/2009   left post lat base of neck   Basal cell carcinoma 10/26/2012   left post base of neck. Excised 12/05/2012, margins free   Basal cell carcinoma 09/27/2017   spinal mid back. EDC, EXC 10/06/20   Basal cell carcinoma 02/11/2019   right mid to upper back paraspinal. Nodular. [place]  BP (high blood pressure) 05/26/2015   Bradycardia    CA of skin 05/26/2015   Chronic constipation 05/26/2015   Cyst and pseudocyst of pancreas 06/02/2013   Edema leg 05/26/2015   Overview:  progressive    Osteoarthritis    Risk for falls    Scoliosis    Sepsis ([place]) 05/15/2015   Squamous cell carcinoma of skin 03/25/2013   right prox dorsum forearm (in situ)   Squamous cell  carcinoma of skin 10/25/2017   left sup lat forehead.    Squamous cell carcinoma of skin 07/30/2019   right index finger. WD SCC. EDc.   Squamous cell carcinoma of skin 04/09/2020   right lower leg, anterior. WD SCC with superficial infiltration. EDC.   Squamous cell carcinoma of skin 04/15/2021   right forearm, EDC   Past Surgical History:  Procedure Laterality Date   APPENDECTOMY  05/16/2015   Procedure: APPENDECTOMY;  Surgeon: Florene Glen, MD;  Location: [facility] ORS;  Service: General;;   CARDIOVERSION N/A 02/18/2021   Procedure: CARDIOVERSION;  Surgeon: Corey Skains, MD;  Location: [facility] ORS;  Service: Cardiovascular;  Laterality: N/A;   CATARACT EXTRACTION W/PHACO Left 11/23/2016   Procedure: CATARACT EXTRACTION PHACO AND INTRAOCULAR LENS PLACEMENT ([place]);  Surgeon: Estill Cotta, MD;  Location: [facility] ORS;  Service: Ophthalmology;  Laterality: Left;  [place] 00:58.4 AP% 22.4 CDE 22.49 Fluid Pack lot # [id number]   COLONOSCOPY  2006   HERNIA REPAIR Right 3/9/   inguinal hernia   LAPAROTOMY N/A 05/16/2015   Procedure: EXPLORATORY LAPAROTOMY, DRAINAGE OF INTRABDOMINAL ABSCESS;  Surgeon: Florene Glen, MD;  Location: [facility] ORS;  Service: General;  Laterality: N/A;   [place]   Social History:  reports that she has never smoked. She has never used smokeless tobacco. She reports that she does not drink alcohol and does not use drugs.  Allergies  Allergen Reactions   Macrobid [Nitrofurantoin] Rash   Sulfa Antibiotics Rash    Family History  Problem Relation Age of Onset   Dementia Mother    Leukemia Father     Prior to Admission medications   Medication Sig Start Date End Date Taking? Authorizing Provider  levothyroxine (SYNTHROID) 50 MCG tablet Take 1 tablet (50 mcg total) by mouth daily. 06/18/21 06/18/22 Yes Lucrezia Starch, MD  acetaminophen (TYLENOL) 500 MG tablet Take 500 mg by mouth every 8 (eight) hours as needed for mild pain.     [provider]  amiodarone (PACERONE) 200 MG tablet Take 0.5 tablets (100 mg total) by mouth 2 (two) times daily. 06/18/21   Lucrezia Starch, MD  apixaban (ELIQUIS) 2.5 MG TABS tablet Take 1 tablet (2.5 mg total) by mouth 2 (two) times daily. 06/27/17   [name], MD  carboxymethylcellulose (REFRESH PLUS) 0.5 % SOLN Place 1-2 drops into both eyes 3 (three) times daily as needed (dry eyes).    [provider]  cholecalciferol (VITAMIN D3) 25 MCG (1000 UNIT) tablet Take 1,000 Units by mouth daily.    [provider]  diclofenac Sodium (VOLTAREN) 1 % GEL Apply 2 g topically 3 (three) times daily as needed (left hip pain).    [provider]  estradiol (ESTRACE) 0.5 MG tablet Take 0.5 mg by mouth daily.    [provider]  fluorouracil (EFUDEX) 5 % cream Apply topically 2 (two) times daily. For 2 weeks 04/15/21   Ralene Bathe, MD  fluticasone [facility]) 50 MCG/ACT nasal spray  Place 2 sprays into both nostrils daily. 11/03/20   Loletha Grayer, MD  furosemide (LASIX) 20 MG tablet Take 1 tablet (20 mg total) by mouth daily. Patient taking differently: Take 10 mg by mouth daily. 11/03/20   Loletha Grayer, MD  HYDROcodone-acetaminophen (NORCO) 10-325 MG tablet Take 1 tablet by mouth every 4 (four) hours as needed.    [provider]  hydrocortisone cream 1 % Apply 1 application topically daily as needed for itching.    [provider]  Multiple Vitamins-Minerals (HCA SUPER THERAVITE-M PO) Take 1 tablet by mouth daily.    [provider]  Multiple Vitamins-Minerals (HEALTHY EYES SUPERVISION 2) CAPS Take 1 capsule by mouth 2 (two) times daily.    [provider]  ondansetron (ZOFRAN) 4 MG tablet Take 4 mg by mouth every 6 (six) hours as needed for nausea or vomiting.    [provider]  pantoprazole (PROTONIX) 40 MG tablet Take 40 mg by mouth daily.    [provider]  potassium chloride SA  (KLOR-CON) 10 MEQ tablet Take 1 tablet (10 mEq total) by mouth daily. 11/03/20   Loletha Grayer, MD  progesterone (PROMETRIUM) 100 MG capsule Take 100 mg by mouth daily. Take 100 mg (1 capsule) by mouth daily for 15 days. Do not take for 15 more days. Then start taking again for 15 days.    [provider]  senna-docusate (SENOKOT-S) 8.6-50 MG tablet Take 1 tablet by mouth daily.    [provider]  traZODone (DESYREL) 50 MG tablet Take 50 mg by mouth at bedtime.    [provider]  triamcinolone cream (KENALOG) 0.1 % Apply 1 application topically 2 (two) times daily as needed (rash).    [provider]    Physical Exam: General appearance: Frail elderly female, lying in bed, makes eye contact, no acute distress.     HEENT: Anicteric, conjunctival pink, lids and lashes normal.  No nasal deformity, discharge, or epistaxis.  Dentition in good repair, lips normal, oropharynx moist, no oral lesions.  Did not appreciate thyromegaly, or neck tenderness nor thyroid nodule. Skin: No suspicious rashes or lesions.  Skin normal for age Cardiac: Irregularly irregular, rate slow, pulses 2+ and symmetric, no JVD, no lower extremity edema. Respiratory: Normal respiratory rate and rhythm, lungs clear without rales or wheezes. Abdomen: Abdomen soft without tenderness palpation or guarding, no distention, no evidence of ileus MSK: Reduced subcutaneous muscle mass and fat Neuro: Awake and alert, extraocular movements intact, moves all extremities with normal strength and coordination, speech fluent. Psych: Attention normal, affect normal, judgment insight appear normal.  She is slightly hard of hearing but no significant psychomotor slowing  Data Reviewed: Complete blood count normal.Electrolytes and renal function normal.  Sodium normal. Complete blood count shows macrocytosis, no anemia.     Family Communication: HCPOA at the bedside Primary team communication: Discussed  with Dr. Tamala Julian  Thank you very much for involving [place] in the care of your patient.  Author: Edwin Dada 06/18/2021 4:02 PM  For on call review [email or web address].

## 2021-06-19 LAB — T3, FREE: T3, Free: 2 pg/mL (ref 2.0–4.4)

## 2021-07-07 ENCOUNTER — Emergency Department
Admission: EM | Admit: 2021-07-07 | Discharge: 2021-07-07 | Disposition: A | Discharge status: Home / Self Care | Payer: Medicare Other | Attending: Emergency Medicine | Admitting: Emergency Medicine

## 2021-07-07 ENCOUNTER — Emergency Department: Payer: Medicare Other

## 2021-07-07 DIAGNOSIS — S51811A Laceration without foreign body of right forearm, initial encounter: Secondary | ICD-10-CM | POA: Insufficient documentation

## 2021-07-07 DIAGNOSIS — W1789XA Other fall from one level to another, initial encounter: Secondary | ICD-10-CM | POA: Diagnosis not present

## 2021-07-07 DIAGNOSIS — W19XXXA Unspecified fall, initial encounter: Secondary | ICD-10-CM

## 2021-07-07 DIAGNOSIS — Z7901 Long term (current) use of anticoagulants: Secondary | ICD-10-CM | POA: Diagnosis not present

## 2021-07-07 DIAGNOSIS — S0990XA Unspecified injury of head, initial encounter: Secondary | ICD-10-CM | POA: Diagnosis present

## 2021-07-07 DIAGNOSIS — S199XXA Unspecified injury of neck, initial encounter: Secondary | ICD-10-CM | POA: Diagnosis not present

## 2021-07-07 NOTE — ED Notes (Signed)
Linda Child psychotherapist contacted , will pick up patient all question answered ,

## 2021-07-07 NOTE — ED Provider Notes (Signed)
[facility] Provider Note    Event Date/Time   First MD Initiated Contact with Patient 07/07/21 [phone]     (approximate)   History   Fall (Mech fall, on eliquis , no loc, pt states she hit her head )   HPI Shelly Ponce is a 88 y.o. female with a past medical history of atrial fibrillation on Eliquis who presents after mechanical fall via EMS.  Patient states that she reached up to get deodorant in her bathroom but forgot to lock her walker and when she attempted to put her weight back onto it it slid out from underneath her causing her to fall back onto her buttocks and striking the back of her head on tile floor.  Patient denies any loss of consciousness.  Patient denies any bleeding on scene.  Patient denies any subsequent nausea/vomiting, loss of consciousness, weakness/numbness/paresthesias in any extremity, chest pain, or shortness of breath     Physical Exam   Triage Vital Signs: ED Triage Vitals  Enc Vitals Group     BP 07/07/21 0805 (!) 134/105     Pulse Rate 07/07/21 0805 (!) 50     Resp 07/07/21 0805 17     Temp 07/07/21 0805 98.4 F (36.9 C)     Temp Source 07/07/21 0805 Oral     SpO2 07/07/21 0805 96 %     Weight --      Height --      Head Circumference --      Peak Flow --      Pain Score 07/07/21 0806 0     Pain Loc --      Pain Edu? --      Excl. in [place]? --     Most recent vital signs: Vitals:   07/07/21 0805 07/07/21 0914  BP: (!) 134/105 (!) 132/105  Pulse: (!) 50 (!) 45  Resp: 17 15  Temp: 98.4 F (36.9 C) 98.5 F (36.9 C)  SpO2: 96% 100%    General: Awake, no distress.  CV:  Good peripheral perfusion.  Resp:  Normal effort.  Abd:  No distention.  Other:  Elderly Caucasian female sitting in bed in no distress   ED Results / Procedures / Treatments   Labs (all labs ordered are listed, but only abnormal results are displayed) Labs Reviewed - No data to display   EKG ED ECG REPORT I, Naaman Plummer, the  attending physician, personally viewed and interpreted this ECG.  Date: 07/07/2021 EKG Time: 0809 Rate: 35 Rhythm: Slow atrial fibrillation QRS Axis: normal Intervals: normal ST/T Wave abnormalities: normal Narrative Interpretation: Slow atrial fibrillation.  No evidence of acute ischemia   RADIOLOGY ED MD interpretation: CT of the head without contrast shows no evidence of acute abnormalities including no intracerebral hemorrhage, obvious masses, or significant edema  CT of the cervical spine does not show any evidence of acute abnormalities including no acute fracture, malalignment, height loss, or dislocation  Agree with radiology assessment  Official radiology report(s): CT Head Wo Contrast  Result Date: 07/07/2021 CLINICAL DATA:  Fall, hit back of head on Eliquis EXAM: CT HEAD WITHOUT CONTRAST TECHNIQUE: Contiguous axial images were obtained from the base of the skull through the vertex without intravenous contrast. RADIATION DOSE REDUCTION: This exam was performed according to the departmental dose-optimization program which includes automated exposure control, adjustment of the mA and/or kV according to patient size and/or use of iterative reconstruction technique. COMPARISON:  CT head 02/12/2019 FINDINGS:  Brain: There is no evidence of acute intracranial hemorrhage, extra-axial fluid collection, or acute infarct. There is mild global parenchymal volume loss with prominence of the ventricular system and extra-axial CSF spaces, unchanged. Patchy hypodensity throughout the subcortical and periventricular white matter likely reflects sequela of moderate chronic white matter microangiopathy. There is no mass lesion.  There is no midline shift. Vascular: There is calcification of the bilateral cavernous ICAs. Skull: Normal. Negative for fracture or focal lesion. Sinuses/Orbits: There is complete opacification of the sphenoid sinuses with hyperdense material which may reflect inspissated  secretions or fungal colonization. A left lens implant is noted. The globes and orbits are otherwise unremarkable. Other: None. IMPRESSION: 1. No acute intracranial hemorrhage or calvarial fracture. 2. Unchanged global parenchymal volume loss and moderate chronic white matter microangiopathy. 3. Chronic sphenoid sinusitis with hyperdense material which may reflect inspissated secretions or fungal colonization. Electronically Signed   By: Valetta Mole M.D.   On: 07/07/2021 08:44   CT Cervical Spine Wo Contrast  Result Date: 07/07/2021 CLINICAL DATA:  Fall, hit back of head EXAM: CT CERVICAL SPINE WITHOUT CONTRAST TECHNIQUE: Multidetector CT imaging of the cervical spine was performed without intravenous contrast. Multiplanar CT image reconstructions were also generated. RADIATION DOSE REDUCTION: This exam was performed according to the departmental dose-optimization program which includes automated exposure control, adjustment of the mA and/or kV according to patient size and/or use of iterative reconstruction technique. COMPARISON:  Cervical spine CT 02/12/2019 FINDINGS: Alignment: There is exaggerated cervical lordosis. There is no antero or retrolisthesis. There is no jumped or perched facet or other evidence of traumatic malalignment. Skull base and vertebrae: Skull base alignment is maintained. Vertebral body heights are preserved. There is no evidence of acute fracture. Soft tissues and spinal canal: No prevertebral fluid or swelling. No visible canal hematoma. Disc levels: There is multilevel intervertebral disc space narrowing, most advanced at C5-C6. There is associated uncovertebral and facet arthropathy. There is no high-grade osseous spinal canal or neural foraminal stenosis. Upper chest: The imaged lung apices are clear. Other: None. IMPRESSION: No acute fracture or traumatic malalignment of the cervical spine. Electronically Signed   By: Valetta Mole M.D.   On: 07/07/2021 08:49       PROCEDURES:  Critical Care performed: No  Procedures   MEDICATIONS ORDERED IN ED: Medications - No data to display   IMPRESSION / MDM / [place] / ED COURSE  I reviewed the triage vital signs and the nursing notes.                              Differential diagnosis includes, but is not limited to, intracranial hemorrhage, skull fracture, ischemic stroke  The patient is on the cardiac monitor to evaluate for evidence of arrhythmia and/or significant heart rate changes.  Presenting after a fall that occurred just prior to arrival, resulting in injury to the right arm with a skin tear. The mechanism of injury was a mechanical ground level fall without syncope or near-syncope. The current level of pain is moderate. There was no loss of consciousness, confusion, seizure, or memory impairment. There is a skin tear associated with the injury. Denies neck pain. The patient [does take blood thinner medications. Denies vomiting, numbness/weakness, fever Patient did show signs of bradycardia in our department in slow atrial fibrillation.  Upon review of patient's previous EKGs from 06/18/2021, 02/18/2021 this is baseline for patient Dispo: Discharge with PCP follow-up  FINAL CLINICAL IMPRESSION(S) / ED DIAGNOSES   Final diagnoses:  Fall, initial encounter  Injury of head, initial encounter  Skin tear of right forearm without complication, initial encounter     Rx / DC Orders   ED Discharge Orders     None        Note:  This document was prepared using Dragon voice recognition software and may include unintentional dictation errors.   Naaman Plummer, MD 07/07/21 [phone]

## 2021-07-07 NOTE — ED Triage Notes (Signed)
Pt at [employer], mechanical fall, hit her back head, skin tear on right forearm, no loc, on eliquis

## 2021-08-12 ENCOUNTER — Ambulatory Visit: Payer: Medicare Other | Admitting: Dermatology

## 2021-09-01 ENCOUNTER — Ambulatory Visit: Payer: Medicare Other | Admitting: Dermatology

## 2021-09-02 ENCOUNTER — Ambulatory Visit: Payer: Medicare Other | Admitting: Dermatology

## 2021-10-06 ENCOUNTER — Ambulatory Visit: Payer: Medicare Other | Admitting: Dermatology

## 2023-10-19 ENCOUNTER — Encounter: Payer: Self-pay | Admitting: Dermatology

## 2023-10-19 ENCOUNTER — Ambulatory Visit: Admitting: Dermatology

## 2023-10-19 DIAGNOSIS — C44320 Squamous cell carcinoma of skin of unspecified parts of face: Secondary | ICD-10-CM

## 2023-10-19 DIAGNOSIS — D492 Neoplasm of unspecified behavior of bone, soft tissue, and skin: Secondary | ICD-10-CM

## 2023-10-19 DIAGNOSIS — D485 Neoplasm of uncertain behavior of skin: Secondary | ICD-10-CM

## 2023-10-19 DIAGNOSIS — W908XXA Exposure to other nonionizing radiation, initial encounter: Secondary | ICD-10-CM | POA: Diagnosis not present

## 2023-10-19 DIAGNOSIS — C44329 Squamous cell carcinoma of skin of other parts of face: Secondary | ICD-10-CM

## 2023-10-19 DIAGNOSIS — Z85828 Personal history of other malignant neoplasm of skin: Secondary | ICD-10-CM | POA: Diagnosis not present

## 2023-10-19 DIAGNOSIS — L578 Other skin changes due to chronic exposure to nonionizing radiation: Secondary | ICD-10-CM

## 2023-10-19 DIAGNOSIS — L570 Actinic keratosis: Secondary | ICD-10-CM | POA: Diagnosis not present

## 2023-10-19 NOTE — Patient Instructions (Signed)

## 2023-10-19 NOTE — Progress Notes (Unsigned)
 Follow-Up Visit   Subjective  Shelly Ponce is a 89 y.o. female who presents for the following: itchy spot at right temple, present for a few months. Patient does have hx of BCC, SCC.   The patient has spots, moles and lesions to be evaluated, some may be new or changing and the patient may have concern these could be cancer.  The following portions of the chart were reviewed this encounter and updated as appropriate: medications, allergies, medical history  Review of Systems:  No other skin or systemic complaints except as noted in HPI or Assessment and Plan.  Objective  Well appearing patient in no apparent distress; mood and affect are within normal limits.  A focused examination was performed of the following areas: face Relevant exam findings are noted in the Assessment and Plan.  Right Temple Crusted plaque 1.7 cm  R forehead, R temple x 3 (3) Erythematous thin papules/macules with gritty scale.   Assessment & Plan   NEOPLASM OF UNCERTAIN BEHAVIOR OF SKIN Right Temple Epidermal / dermal shaving  Lesion diameter (cm):  1.7 Informed consent: discussed and consent obtained   Timeout: patient name, date of birth, surgical site, and procedure verified   Procedure prep:  Patient was prepped and draped in usual sterile fashion Prep type:  Isopropyl alcohol Anesthesia: the lesion was anesthetized in a standard fashion   Anesthetic:  1% lidocaine  w/ epinephrine  1-100,000 buffered w/ 8.4% NaHCO3 Instrument used: flexible razor blade   Hemostasis achieved with: pressure, aluminum chloride and electrodesiccation   Outcome: patient tolerated procedure well   Post-procedure details: sterile dressing applied and wound care instructions given   Dressing type: bandage and petrolatum    Destruction of lesion Complexity: extensive   Destruction method: electrodesiccation and curettage   Informed consent: discussed and consent obtained   Timeout:  patient name, date of birth,  surgical site, and procedure verified Procedure prep:  Patient was prepped and draped in usual sterile fashion Prep type:  Isopropyl alcohol Anesthesia: the lesion was anesthetized in a standard fashion   Anesthetic:  1% lidocaine  w/ epinephrine  1-100,000 buffered w/ 8.4% NaHCO3 Curettage performed in three different directions: Yes   Electrodesiccation performed over the curetted area: Yes   Lesion length (cm):  1.7 Lesion width (cm):  1.7 Margin per side (cm):  0.2 Final wound size (cm):  2.1 Hemostasis achieved with:  pressure, aluminum chloride and electrodesiccation Outcome: patient tolerated procedure well with no complications   Post-procedure details: sterile dressing applied and wound care instructions given   Dressing type: bandage and petrolatum   Specimen 1 - Surgical pathology Differential Diagnosis: r/o BCC  Check Margins: No Crusted plaque 1.7 cm Treated with EDC 2 specimens AK (ACTINIC KERATOSIS) (3) R forehead, R temple x 3 (3) Actinic keratoses are precancerous spots that appear secondary to cumulative UV radiation exposure/sun exposure over time. They are chronic with expected duration over 1 year. A portion of actinic keratoses will progress to squamous cell carcinoma of the skin. It is not possible to reliably predict which spots will progress to skin cancer and so treatment is recommended to prevent development of skin cancer.  Recommend daily broad spectrum sunscreen SPF 30+ to sun-exposed areas, reapply every 2 hours as needed.  Recommend staying in the shade or wearing long sleeves, sun glasses (UVA+UVB protection) and wide brim hats (4-inch brim around the entire circumference of the hat). Call for new or changing lesions. Destruction of lesion - R forehead, R temple x  3 (3) Complexity: simple   Destruction method: cryotherapy   Informed consent: discussed and consent obtained   Timeout:  patient name, date of birth, surgical site, and procedure  verified Lesion destroyed using liquid nitrogen: Yes   Region frozen until ice ball extended beyond lesion: Yes   Outcome: patient tolerated procedure well with no complications   Post-procedure details: wound care instructions given   ACTINIC SKIN DAMAGE   HISTORY OF BASAL CELL CARCINOMA    ACTINIC DAMAGE - chronic, secondary to cumulative UV radiation exposure/sun exposure over time - diffuse scaly erythematous macules with underlying dyspigmentation - Recommend daily broad spectrum sunscreen SPF 30+ to sun-exposed areas, reapply every 2 hours as needed.  - Recommend staying in the shade or wearing long sleeves, sun glasses (UVA+UVB protection) and wide brim hats (4-inch brim around the entire circumference of the hat). - Call for new or changing lesions.  HISTORY OF BASAL CELL CARCINOMA OF THE SKIN - No evidence of recurrence today - Recommend regular full body skin exams - Recommend daily broad spectrum sunscreen SPF 30+ to sun-exposed areas, reapply every 2 hours as needed.  - Call if any new or changing lesions are noted between office visits  Return if symptoms worsen or fail to improve.  Kerstin Peeling, RMA, am acting as scribe for Celine Collard, MD .   Documentation: I have reviewed the above documentation for accuracy and completeness, and I agree with the above.  Celine Collard, MD

## 2023-10-25 LAB — SURGICAL PATHOLOGY

## 2023-10-26 ENCOUNTER — Telehealth: Payer: Self-pay

## 2023-10-26 NOTE — Telephone Encounter (Signed)
-----   Message from Shelly Ponce sent at 10/26/2023 12:39 PM EDT ----- FINAL DIAGNOSIS        1. Skin, right temple :       WELL DIFFERENTIATED SQUAMOUS CELL CARCINOMA   Cancer = SCC Already treated Recheck next visit

## 2023-10-26 NOTE — Telephone Encounter (Signed)
 Advised patient's caregiver of bx results./sh

## 2024-06-03 ENCOUNTER — Ambulatory Visit: Admitting: Dermatology

## 2024-06-03 DIAGNOSIS — Z7189 Other specified counseling: Secondary | ICD-10-CM

## 2024-06-03 DIAGNOSIS — Z85828 Personal history of other malignant neoplasm of skin: Secondary | ICD-10-CM

## 2024-06-03 DIAGNOSIS — D489 Neoplasm of uncertain behavior, unspecified: Secondary | ICD-10-CM

## 2024-06-03 DIAGNOSIS — L578 Other skin changes due to chronic exposure to nonionizing radiation: Secondary | ICD-10-CM

## 2024-06-03 DIAGNOSIS — L820 Inflamed seborrheic keratosis: Secondary | ICD-10-CM

## 2024-06-03 DIAGNOSIS — Z8589 Personal history of malignant neoplasm of other organs and systems: Secondary | ICD-10-CM

## 2024-06-03 NOTE — Patient Instructions (Addendum)
 Biopsy Wound Care Instructions  Leave the original bandage on for 24 hours if possible.  If the bandage becomes soaked or soiled before that time, it is OK to remove it and examine the wound.  A small amount of post-operative bleeding is normal.  If excessive bleeding occurs, remove the bandage, place gauze over the site and apply continuous pressure (no peeking) over the area for 30 minutes. If this does not work, please call our clinic as soon as possible or page your doctor if it is after hours.   Once a day, cleanse the wound with soap and water. It is fine to shower. If a thick crust develops you may use a Q-tip dipped into dilute hydrogen peroxide (mix 1:1 with water) to dissolve it.  Hydrogen peroxide can slow the healing process, so use it only as needed.    After washing, apply petroleum jelly (Vaseline) or an antibiotic ointment if your doctor prescribed one for you, followed by a bandage.    For best healing, the wound should be covered with a layer of ointment at all times. If you are not able to keep the area covered with a bandage to hold the ointment in place, this may mean re-applying the ointment several times a day.  Continue this wound care until the wound has healed and is no longer open.   Itching and mild discomfort is normal during the healing process. However, if you develop pain or severe itching, please call our office.   If you have any discomfort, you can take Tylenol  (acetaminophen ) or ibuprofen as directed on the bottle. (Please do not take these if you have an allergy to them or cannot take them for another reason).  Some redness, tenderness and white or yellow material in the wound is normal healing.  If the area becomes very sore and red, or develops a thick yellow-green material (pus), it may be infected; please notify us .    If you have stitches, return to clinic as directed to have the stitches removed. You will continue wound care for 2-3 days after the stitches  are removed.   Wound healing continues for up to one year following surgery. It is not unusual to experience pain in the scar from time to time during the interval.  If the pain becomes severe or the scar thickens, you should notify the office.    A slight amount of redness in a scar is expected for the first six months.  After six months, the redness will fade and the scar will soften and fade.  The color difference becomes less noticeable with time.  If there are any problems, return for a post-op surgery check at your earliest convenience.  To improve the appearance of the scar, you can use silicone scar gel, cream, or sheets (such as Mederma or Serica) every night for up to one year. These are available over the counter (without a prescription).  Please call our office at [phone] for any questions or concerns.    Electrodesiccation and Curettage (Scrape and Burn) Wound Care Instructions  Leave the original bandage on for 24 hours if possible.  If the bandage becomes soaked or soiled before that time, it is OK to remove it and examine the wound.  A small amount of post-operative bleeding is normal.  If excessive bleeding occurs, remove the bandage, place gauze over the site and apply continuous pressure (no peeking) over the area for 30 minutes. If this does not work, please call  our clinic as soon as possible or page your doctor if it is after hours.   Once a day, cleanse the wound with soap and water. It is fine to shower. If a thick crust develops you may use a Q-tip dipped into dilute hydrogen peroxide (mix 1:1 with water) to dissolve it.  Hydrogen peroxide can slow the healing process, so use it only as needed.    After washing, apply petroleum jelly (Vaseline) or an antibiotic ointment if your doctor prescribed one for you, followed by a bandage.    For best healing, the wound should be covered with a layer of ointment at all times. If you are not able to keep the area covered  with a bandage to hold the ointment in place, this may mean re-applying the ointment several times a day.  Continue this wound care until the wound has healed and is no longer open. It may take several weeks for the wound to heal and close.  Itching and mild discomfort is normal during the healing process.  If you have any discomfort, you can take Tylenol  (acetaminophen ) or ibuprofen as directed on the bottle. (Please do not take these if you have an allergy to them or cannot take them for another reason).  Some redness, tenderness and white or yellow material in the wound is normal healing.  If the area becomes very sore and red, or develops a thick yellow-green material (pus), it may be infected; please notify us .    Wound healing continues for up to one year following surgery. It is not unusual to experience pain in the scar from time to time during the interval.  If the pain becomes severe or the scar thickens, you should notify the office.    A slight amount of redness in a scar is expected for the first six months.  After six months, the redness will fade and the scar will soften and fade.  The color difference becomes less noticeable with time.  If there are any problems, return for a post-op surgery check at your earliest convenience.  To improve the appearance of the scar, you can use silicone scar gel, cream, or sheets (such as Mederma or Serica) every night for up to one year. These are available over the counter (without a prescription).  Please call our office at [phone] for any questions or concerns.     Seborrheic Keratosis  What causes seborrheic keratoses? Seborrheic keratoses are harmless, common skin growths that first appear during adult life.  As time goes by, more growths appear.  Some people may develop a large number of them.  Seborrheic keratoses appear on both covered and uncovered body parts.  They are not caused by sunlight.  The tendency to develop seborrheic  keratoses can be inherited.  They vary in color from skin-colored to gray, brown, or even black.  They can be either smooth or have a rough, warty surface.   Seborrheic keratoses are superficial and look as if they were stuck on the skin.  Under the microscope this type of keratosis looks like layers upon layers of skin.  That is why at times the top layer may seem to fall off, but the rest of the growth remains and re-grows.    Treatment Seborrheic keratoses do not need to be treated, but can easily be removed in the office.  Seborrheic keratoses often cause symptoms when they rub on clothing or jewelry.  Lesions can be in the way of shaving.  If they become inflamed, they can cause itching, soreness, or burning.  Removal of a seborrheic keratosis can be accomplished by freezing, burning, or surgery. If any spot bleeds, scabs, or grows rapidly, please return to have it checked, as these can be an indication of a skin cancer.   Cryotherapy Aftercare  Wash gently with soap and water everyday.   Apply Vaseline and Band-Aid daily until healed.    Due to recent changes in healthcare laws, you may see results of your pathology and/or laboratory studies on MyChart before the doctors have had a chance to review them. We understand that in some cases there may be results that are confusing or concerning to you. Please understand that not all results are received at the same time and often the doctors may need to interpret multiple results in order to provide you with the best plan of care or course of treatment. Therefore, we ask that you please give us  2 business days to thoroughly review all your results before contacting the office for clarification. Should we see a critical lab result, you will be contacted sooner.   If You Need Anything After Your Visit  If you have any questions or concerns for your doctor, please call our main line at [phone] and press option 4 to reach your doctor's medical  assistant. If no one answers, please leave a voicemail as directed and we will return your call as soon as possible. Messages left after 4 pm will be answered the following business day.   You may also send us  a message via MyChart. We typically respond to MyChart messages within 1-2 business days.  For prescription refills, please ask your pharmacy to contact our office. Our fax number is [phone].  If you have an urgent issue when the clinic is closed that cannot wait until the next business day, you can page your doctor at the number below.    Please note that while we do our best to be available for urgent issues outside of office hours, we are not available 24/7.   If you have an urgent issue and are unable to reach us , you may choose to seek medical care at your doctor's office, retail clinic, urgent care center, or emergency room.  If you have a medical emergency, please immediately call 911 or go to the emergency department.  Pager Numbers  - Dr. Hester: [phone]  - Dr. Jackquline: [phone]  - Dr. Claudene: [phone]   - Dr. Raymund: [phone]  In the event of inclement weather, please call our main line at [phone] for an update on the status of any delays or closures.  Dermatology Medication Tips: Please keep the boxes that topical medications come in in order to help keep track of the instructions about where and how to use these. Pharmacies typically print the medication instructions only on the boxes and not directly on the medication tubes.   If your medication is too expensive, please contact our office at [phone] option 4 or send us  a message through MyChart.   We are unable to tell what your co-pay for medications will be in advance as this is different depending on your insurance coverage. However, we may be able to find a substitute medication at lower cost or fill out paperwork to get insurance to cover a needed medication.   If a prior  authorization is required to get your medication covered by your insurance company, please allow us  1-2 business days to complete this process.  Drug prices often vary depending on where the prescription is filled and some pharmacies may offer cheaper prices.  The website www.goodrx.com contains coupons for medications through different pharmacies. The prices here do not account for what the cost may be with help from insurance (it may be cheaper with your insurance), but the website can give you the price if you did not use any insurance.  - You can print the associated coupon and take it with your prescription to the pharmacy.  - You may also stop by our office during regular business hours and pick up a GoodRx coupon card.  - If you need your prescription sent electronically to a different pharmacy, notify our office through [facility] or by phone at [phone] option 4.     Si Usted Necesita Algo Despus de Su Visita  Tambin puede enviarnos un mensaje a travs de Clinical Cytogeneticist. Por lo general respondemos a los mensajes de MyChart en el transcurso de 1 a 2 das hbiles.  Para renovar recetas, por favor pida a su farmacia que se ponga en contacto con nuestra oficina. Randi lakes de fax es [place] [phone].  Si tiene un asunto urgente cuando la clnica est cerrada y que no puede esperar hasta el siguiente da hbil, puede llamar/localizar a su doctor(a) al nmero que aparece a continuacin.   Por favor, tenga en cuenta que aunque hacemos todo lo posible para estar disponibles para asuntos urgentes fuera del horario de [place], no estamos disponibles las 24 horas del da, los 7 [address]  [address] de la [place].   Si tiene un problema urgente y no puede comunicarse con nosotros, puede optar por buscar atencin mdica  en el consultorio de su doctor(a), en una clnica privada, en un centro de atencin urgente o en una sala de emergencias.  Si tiene engineer, drilling, por favor llame  inmediatamente al 911 o vaya a la sala de emergencias.  Nmeros de bper  - Dr. Hester: [phone]  - Dra. Jackquline: [phone]  - Dr. Claudene: [phone]  - Dra. Kitts: [phone]  En caso de inclemencias del [place], por favor llame a nuestra lnea principal al [phone] para una actualizacin sobre el estado de cualquier retraso o cierre.  Consejos para la medicacin en dermatologa: Por favor, guarde las cajas en las que vienen los medicamentos de uso tpico para ayudarle a seguir las instrucciones sobre dnde y cmo usarlos. Las farmacias generalmente imprimen las instrucciones del medicamento slo en las cajas y no directamente en los tubos del [place].   Si su medicamento es muy caro, por favor, pngase en contacto con landry rieger llamando al [phone] y presione la opcin 4 o envenos un mensaje a travs de Clinical Cytogeneticist.   No podemos decirle cul ser su copago por los medicamentos por adelantado ya que esto es diferente dependiendo de la cobertura de su seguro. Sin embargo, es posible que podamos encontrar un medicamento sustituto a audiological scientist un formulario para que el seguro cubra el medicamento que se considera necesario.   Si se requiere una autorizacin previa para que su compaa de seguros cubra su medicamento, por favor permtanos de 1 a 2 das hbiles para completar este proceso.  Los precios de los medicamentos varan con frecuencia dependiendo del environmental consultant de dnde se surte la receta y alguna farmacias pueden ofrecer precios ms baratos.  El sitio web www.goodrx.com tiene cupones para medicamentos de health and safety inspector. Los precios aqu no tienen en cuenta lo que podra costar con la ayuda del  seguro (puede ser ms barato con su seguro), pero el sitio web puede darle el precio si no visual merchandiser.  - Puede imprimir el cupn correspondiente y llevarlo con su receta a la farmacia.  - Tambin puede pasar por nuestra oficina durante el horario  de atencin regular y education officer, museum una tarjeta de cupones de GoodRx.  - Si necesita que su receta se enve electrnicamente a una farmacia diferente, informe a nuestra oficina a travs de MyChart de [place] o por telfono llamando al [phone] y presione la opcin 4.

## 2024-06-03 NOTE — Progress Notes (Unsigned)
 Follow-Up Visit   Subjective  Shelly Ponce is a 89 y.o. female who presents for the following:  Patient here with her health care power of attorney [place] concerning a spot that was treated at last follow up in May 2025. HPOA reports concerned spot may be returning. Also concerned with a spot near left ear that patient has been picking at and will not go away  The patient has spots, moles and lesions to be evaluated, some may be new or changing and the patient may have concern these could be cancer.  The following portions of the chart were reviewed this encounter and updated as appropriate: medications, allergies, medical history  Review of Systems:  No other skin or systemic complaints except as noted in HPI or Assessment and Plan.  Objective  Well appearing patient in no apparent distress; mood and affect are within normal limits.  A focused examination was performed of the following areas: Face  Relevant exam findings are noted in the Assessment and Plan. Right Temple above lateral brow - Likely RECURRENT SCC.  patient accompanied by health care power of attorney,who advocates for removing this today rather than sending for recommended MOHS surgery due to difficulty transporting pt and mental status etc. 2.1 cm hyperkeratotic papule with central crust   Left Preauricular Area 2.5 x 1 cm pearly red plaque with crust right lateral temple x 1 Erythematous stuck-on, waxy papule or plaque  Assessment & Plan   HISTORY OF SQUAMOUS CELL CARCINOMA OF THE SKIN 10/19/2023 right temple treat with [facility] 04/15/2021 Right forearm ED&C 04/09/2020 right lower leg anterior ED&C 07/30/2019 right index finger ED&C 10/25/2017 left superior lateral forehead  - No evidence of recurrence today - No lymphadenopathy - Recommend regular full body skin exams - Recommend daily broad spectrum sunscreen SPF 30+ to sun-exposed areas, reapply every 2 hours as needed.  - Call if any new or changing lesions  are noted between office visits  HISTORY OF SQUAMOUS CELL CARCINOMA IN SITU OF THE SKIN 03/25/2013 right prox dorsum forearm  - No evidence of recurrence today - Recommend regular full body skin exams - Recommend daily broad spectrum sunscreen SPF 30+ to sun-exposed areas, reapply every 2 hours as needed.  - Call if any new or changing lesions are noted between office visits  HISTORY OF BASAL CELL CARCINOMA OF THE SKIN 02/11/2019 right mid to upper back paraspinal ED&C 4/10/23019 spinal mid back ED&C then excision 10/06/2020 10/26/2012 left post base of neck excised 12/05/2012 margin free  08/17/2009 left post lat base of neck 12/11/2008 right post neck - No evidence of recurrence today - Recommend regular full body skin exams - Recommend daily broad spectrum sunscreen SPF 30+ to sun-exposed areas, reapply every 2 hours as needed.  - Call if any new or changing lesions are noted between office visits  ACTINIC DAMAGE - chronic, secondary to cumulative UV radiation exposure/sun exposure over time - diffuse scaly erythematous macules with underlying dyspigmentation - Recommend daily broad spectrum sunscreen SPF 30+ to sun-exposed areas, reapply every 2 hours as needed.  - Recommend staying in the shade or wearing long sleeves, sun glasses (UVA+UVB protection) and wide brim hats (4-inch brim around the entire circumference of the hat). - Call for new or changing lesions.  NEOPLASM OF UNCERTAIN BEHAVIOR (2) Right Temple above lateral brow - Likely RECURRENT SCC.  patient accompanied by health care power of attorney,who advocates for removing this today rather than sending for recommended MOHS surgery due to difficulty  transporting pt and mental status etc. - Epidermal / dermal shaving  Lesion diameter (cm):  2.1 Informed consent: discussed and consent obtained   Timeout: patient name, date of birth, surgical site, and procedure verified   Procedure prep:  Patient was prepped and draped in usual  sterile fashion Prep type:  Isopropyl alcohol Anesthesia: the lesion was anesthetized in a standard fashion   Anesthetic:  1% lidocaine  w/ epinephrine  1-100,000 buffered w/ 8.4% NaHCO3 Instrument used: flexible razor blade   Hemostasis achieved with: pressure, aluminum chloride and electrodesiccation   Outcome: patient tolerated procedure well   Post-procedure details: sterile dressing applied and wound care instructions given   Dressing type: bandage and petrolatum    - Destruction of lesion Complexity: extensive   Destruction method: electrodesiccation and curettage   Informed consent: discussed and consent obtained   Timeout:  patient name, date of birth, surgical site, and procedure verified Procedure prep:  Patient was prepped and draped in usual sterile fashion Prep type:  Isopropyl alcohol Anesthesia: the lesion was anesthetized in a standard fashion   Anesthetic:  1% lidocaine  w/ epinephrine  1-100,000 buffered w/ 8.4% NaHCO3 Curettage performed in three different directions: Yes   Electrodesiccation performed over the curetted area: Yes   Lesion length (cm):  2.1 Lesion width (cm):  2.1 Margin per side (cm):  0.2 Final wound size (cm):  2.5 Hemostasis achieved with:  pressure, aluminum chloride and electrodesiccation Outcome: patient tolerated procedure well with no complications   Post-procedure details: sterile dressing applied and wound care instructions given   Dressing type: bandage and petrolatum    Specimen 1 - Surgical pathology Differential Diagnosis: r/o recurrent scc vs other ED&C today See previous pathology  From 10/19/2023  Accession: [id number]  Check Margins: no Left Preauricular Area - Skin / nail biopsy Type of biopsy: tangential   Informed consent: discussed and consent obtained   Timeout: patient name, date of birth, surgical site, and procedure verified   Procedure prep:  Patient was prepped and draped in usual sterile fashion Prep type:   Isopropyl alcohol Anesthesia: the lesion was anesthetized in a standard fashion   Anesthetic:  1% lidocaine  w/ epinephrine  1-100,000 buffered w/ 8.4% NaHCO3 Instrument used: flexible razor blade   Hemostasis achieved with: pressure, aluminum chloride and electrodesiccation   Outcome: patient tolerated procedure well   Post-procedure details: sterile dressing applied and wound care instructions given   Dressing type: petrolatum and bandage    Specimen 2 - Surgical pathology Differential Diagnosis: r/o bcc   Check Margins: No Shv removal and ED&C at right temple above lateral brow Possible recurrent SCC See previous pathology  From 10/19/2023  Accession: [id number]   Discussed may need Mohs procedure vs radiation. HPOA would consider Mohs in [facility] if positive for recurrent SCC.   Discussed Bx today  left preauricular  Discussed could also need Mohs vs radiation treatment HPOA would consider Mohs in [facility] if positive for [facility]  Informational pamphlet given to HPOA at today's appt  Discussed numbing medication could cause droopy eyebrow   INFLAMED SEBORRHEIC KERATOSIS right lateral temple x 1 Symptomatic, irritating, patient would like treated. - Destruction of lesion - right lateral temple x 1 Complexity: simple   Destruction method: cryotherapy   Informed consent: discussed and consent obtained   Timeout:  patient name, date of birth, surgical site, and procedure verified Lesion destroyed using liquid nitrogen: Yes   Region frozen until ice ball extended beyond lesion: Yes   Outcome: patient tolerated procedure well  with no complications   Post-procedure details: wound care instructions given    COUNSELING AND COORDINATION OF CARE   ACTINIC SKIN DAMAGE   HISTORY OF BASAL CELL CARCINOMA   HISTORY OF SQUAMOUS CELL CARCINOMA    Counseling [place] Lee, patient's healthcare power of attorney who is present with patient today advocates for trying to remove the  probable recurrent squamous cell carcinoma of the right temple about the brow rather than sending to be recommended Mohs surgery.  She states this would be easier than trying to transport the patient due to difficulty in her mobility as well as her mental status.  I advised that we will remove this and treat it but there is a good likelihood this may recur and need further treatment with Mohs surgery.  Rock Lee, understands this.  Also the suspected basal cell carcinoma vs SCC of the left preauricular biopsied today may need Mohs surgery versus radiation.  She understands this and would advocate for Mohs surgery over radiation due to having to transport the patient regularly for radiation and this may not be logistically possible.  Again, Rock Lee understands these areas may need additional treatment over what has been done today.   Return if symptoms worsen or fail to improve.  IEleanor Blush, CMA, am acting as scribe for Alm Rhyme, MD.   Documentation: I have reviewed the above documentation for accuracy and completeness, and I agree with the above.  Alm Rhyme, MD

## 2024-06-04 ENCOUNTER — Encounter: Payer: Self-pay | Admitting: Dermatology

## 2024-06-04 LAB — SURGICAL PATHOLOGY

## 2024-06-05 ENCOUNTER — Encounter: Payer: Self-pay | Admitting: Dermatology

## 2024-06-05 ENCOUNTER — Ambulatory Visit: Payer: Self-pay | Admitting: Dermatology

## 2024-06-05 DIAGNOSIS — C4492 Squamous cell carcinoma of skin, unspecified: Secondary | ICD-10-CM

## 2024-06-05 NOTE — Telephone Encounter (Addendum)
 Called and discussed bx results and treatment with [facility]. She verbalized understanding and would like patient to have Mohs treatment in [place] with Dr. Corey  Referral sent to Dr. Paci for Mohs  Discussed area at right temple above lateral brow may need additional procedure in future if recurrent.    ----- Message from Alm Rhyme, MD sent at 06/05/2024 10:56 AM EST ----- FINAL DIAGNOSIS        1. Skin, right temple above lateral brow :       WELL DIFFERENTIATED SQUAMOUS CELL CARCINOMA        2. Skin, left preauricular area :       MODERATELY DIFFERENTIATED SQUAMOUS CELL CARCINOMA, ADENOID VARIANT    1- Cancer = SCC Well Differentiated Recurrent Already treated per request by pt's Health Care Power of Attorney (due to mobility issues and poor tolerance of procedures) High risk of recurrence and may need additional procedure if evidence of recurrence in future.  2- Cancer = SCC Moderately differentiated Schedule for MOHS

## 2024-07-11 ENCOUNTER — Encounter: Payer: Self-pay | Admitting: Dermatology

## 2024-07-17 ENCOUNTER — Encounter: Admitting: Dermatology

## 2024-08-01 ENCOUNTER — Encounter: Admitting: Dermatology
